# Patient Record
Sex: Female | Born: 1940 | Race: White | Hispanic: No | Smoking: Former smoker
Health system: Southern US, Community
[De-identification: ages and names within clinical notes are randomized; demographics above are authoritative.]

## PROBLEM LIST (undated history)

## (undated) DIAGNOSIS — T7840XA Allergy, unspecified, initial encounter: Secondary | ICD-10-CM

## (undated) DIAGNOSIS — I10 Essential (primary) hypertension: Secondary | ICD-10-CM

## (undated) DIAGNOSIS — E7800 Pure hypercholesterolemia, unspecified: Secondary | ICD-10-CM

## (undated) DIAGNOSIS — Z923 Personal history of irradiation: Secondary | ICD-10-CM

## (undated) DIAGNOSIS — K209 Esophagitis, unspecified without bleeding: Secondary | ICD-10-CM

## (undated) DIAGNOSIS — K219 Gastro-esophageal reflux disease without esophagitis: Secondary | ICD-10-CM

## (undated) DIAGNOSIS — M1990 Unspecified osteoarthritis, unspecified site: Secondary | ICD-10-CM

## (undated) DIAGNOSIS — M7660 Achilles tendinitis, unspecified leg: Secondary | ICD-10-CM

## (undated) DIAGNOSIS — J439 Emphysema, unspecified: Secondary | ICD-10-CM

## (undated) DIAGNOSIS — D170 Benign lipomatous neoplasm of skin and subcutaneous tissue of head, face and neck: Secondary | ICD-10-CM

## (undated) DIAGNOSIS — M5430 Sciatica, unspecified side: Secondary | ICD-10-CM

## (undated) DIAGNOSIS — Z87442 Personal history of urinary calculi: Secondary | ICD-10-CM

## (undated) DIAGNOSIS — E041 Nontoxic single thyroid nodule: Secondary | ICD-10-CM

## (undated) DIAGNOSIS — K2970 Gastritis, unspecified, without bleeding: Secondary | ICD-10-CM

## (undated) HISTORY — DX: Unspecified osteoarthritis, unspecified site: M19.90

## (undated) HISTORY — PX: APPENDECTOMY: SHX54

## (undated) HISTORY — DX: Nontoxic single thyroid nodule: E04.1

## (undated) HISTORY — DX: Emphysema, unspecified: J43.9

## (undated) HISTORY — DX: Gastro-esophageal reflux disease without esophagitis: K21.9

## (undated) HISTORY — DX: Allergy, unspecified, initial encounter: T78.40XA

## (undated) HISTORY — DX: Benign lipomatous neoplasm of skin and subcutaneous tissue of head, face and neck: D17.0

---

## 1983-07-20 HISTORY — PX: ABDOMINAL HYSTERECTOMY: SHX81

## 2002-07-19 HISTORY — PX: BLADDER SUSPENSION: SHX72

## 2004-12-31 ENCOUNTER — Ambulatory Visit: Payer: Self-pay | Admitting: Unknown Physician Specialty

## 2005-11-05 ENCOUNTER — Ambulatory Visit: Payer: Self-pay | Admitting: Unknown Physician Specialty

## 2006-01-04 ENCOUNTER — Ambulatory Visit: Payer: Self-pay | Admitting: Unknown Physician Specialty

## 2006-10-31 ENCOUNTER — Emergency Department: Payer: Self-pay | Admitting: Emergency Medicine

## 2007-01-06 ENCOUNTER — Ambulatory Visit: Payer: Self-pay | Admitting: Unknown Physician Specialty

## 2008-01-09 ENCOUNTER — Ambulatory Visit: Payer: Self-pay | Admitting: Unknown Physician Specialty

## 2009-01-09 ENCOUNTER — Ambulatory Visit: Payer: Self-pay | Admitting: Unknown Physician Specialty

## 2009-01-15 ENCOUNTER — Ambulatory Visit: Payer: Self-pay | Admitting: Unknown Physician Specialty

## 2010-02-06 ENCOUNTER — Ambulatory Visit: Payer: Self-pay | Admitting: Unknown Physician Specialty

## 2010-04-13 ENCOUNTER — Other Ambulatory Visit: Payer: Self-pay | Admitting: Internal Medicine

## 2010-07-20 LAB — HM COLONOSCOPY: HM Colonoscopy: NORMAL

## 2011-04-13 ENCOUNTER — Ambulatory Visit: Payer: Self-pay | Admitting: Internal Medicine

## 2011-05-20 ENCOUNTER — Ambulatory Visit: Payer: Self-pay | Admitting: Unknown Physician Specialty

## 2011-08-02 DIAGNOSIS — I10 Essential (primary) hypertension: Secondary | ICD-10-CM | POA: Diagnosis not present

## 2011-08-02 DIAGNOSIS — R42 Dizziness and giddiness: Secondary | ICD-10-CM | POA: Diagnosis not present

## 2011-08-02 DIAGNOSIS — H6120 Impacted cerumen, unspecified ear: Secondary | ICD-10-CM | POA: Diagnosis not present

## 2011-08-24 DIAGNOSIS — E785 Hyperlipidemia, unspecified: Secondary | ICD-10-CM | POA: Diagnosis not present

## 2011-08-24 DIAGNOSIS — I10 Essential (primary) hypertension: Secondary | ICD-10-CM | POA: Diagnosis not present

## 2011-11-09 DIAGNOSIS — E785 Hyperlipidemia, unspecified: Secondary | ICD-10-CM | POA: Diagnosis not present

## 2011-11-09 DIAGNOSIS — I10 Essential (primary) hypertension: Secondary | ICD-10-CM | POA: Diagnosis not present

## 2011-11-23 DIAGNOSIS — E785 Hyperlipidemia, unspecified: Secondary | ICD-10-CM | POA: Diagnosis not present

## 2011-11-23 DIAGNOSIS — I10 Essential (primary) hypertension: Secondary | ICD-10-CM | POA: Diagnosis not present

## 2011-11-23 DIAGNOSIS — R05 Cough: Secondary | ICD-10-CM | POA: Diagnosis not present

## 2012-03-27 DIAGNOSIS — I10 Essential (primary) hypertension: Secondary | ICD-10-CM | POA: Diagnosis not present

## 2012-03-27 DIAGNOSIS — E785 Hyperlipidemia, unspecified: Secondary | ICD-10-CM | POA: Diagnosis not present

## 2012-04-13 ENCOUNTER — Ambulatory Visit: Payer: Self-pay | Admitting: Internal Medicine

## 2012-04-13 DIAGNOSIS — Z1231 Encounter for screening mammogram for malignant neoplasm of breast: Secondary | ICD-10-CM | POA: Diagnosis not present

## 2012-04-25 DIAGNOSIS — Z23 Encounter for immunization: Secondary | ICD-10-CM | POA: Diagnosis not present

## 2012-08-21 DIAGNOSIS — E785 Hyperlipidemia, unspecified: Secondary | ICD-10-CM | POA: Diagnosis not present

## 2012-08-21 DIAGNOSIS — I10 Essential (primary) hypertension: Secondary | ICD-10-CM | POA: Diagnosis not present

## 2012-08-28 DIAGNOSIS — I129 Hypertensive chronic kidney disease with stage 1 through stage 4 chronic kidney disease, or unspecified chronic kidney disease: Secondary | ICD-10-CM | POA: Diagnosis not present

## 2012-08-28 DIAGNOSIS — E785 Hyperlipidemia, unspecified: Secondary | ICD-10-CM | POA: Diagnosis not present

## 2013-01-22 DIAGNOSIS — M65839 Other synovitis and tenosynovitis, unspecified forearm: Secondary | ICD-10-CM | POA: Diagnosis not present

## 2013-02-19 DIAGNOSIS — I129 Hypertensive chronic kidney disease with stage 1 through stage 4 chronic kidney disease, or unspecified chronic kidney disease: Secondary | ICD-10-CM | POA: Diagnosis not present

## 2013-02-19 DIAGNOSIS — N181 Chronic kidney disease, stage 1: Secondary | ICD-10-CM | POA: Diagnosis not present

## 2013-02-19 DIAGNOSIS — Z0000 Encounter for general adult medical examination without abnormal findings: Secondary | ICD-10-CM | POA: Diagnosis not present

## 2013-02-19 DIAGNOSIS — E785 Hyperlipidemia, unspecified: Secondary | ICD-10-CM | POA: Diagnosis not present

## 2013-04-13 DIAGNOSIS — Z23 Encounter for immunization: Secondary | ICD-10-CM | POA: Diagnosis not present

## 2013-04-17 ENCOUNTER — Ambulatory Visit: Payer: Self-pay | Admitting: Family

## 2013-04-17 DIAGNOSIS — Z1231 Encounter for screening mammogram for malignant neoplasm of breast: Secondary | ICD-10-CM | POA: Diagnosis not present

## 2013-06-21 DIAGNOSIS — I129 Hypertensive chronic kidney disease with stage 1 through stage 4 chronic kidney disease, or unspecified chronic kidney disease: Secondary | ICD-10-CM | POA: Diagnosis not present

## 2013-06-21 DIAGNOSIS — N181 Chronic kidney disease, stage 1: Secondary | ICD-10-CM | POA: Diagnosis not present

## 2013-06-21 DIAGNOSIS — N390 Urinary tract infection, site not specified: Secondary | ICD-10-CM | POA: Diagnosis not present

## 2013-06-21 DIAGNOSIS — T148XXA Other injury of unspecified body region, initial encounter: Secondary | ICD-10-CM | POA: Diagnosis not present

## 2014-01-16 DIAGNOSIS — I10 Essential (primary) hypertension: Secondary | ICD-10-CM | POA: Diagnosis not present

## 2014-01-16 DIAGNOSIS — E785 Hyperlipidemia, unspecified: Secondary | ICD-10-CM | POA: Diagnosis not present

## 2014-01-16 DIAGNOSIS — E8941 Symptomatic postprocedural ovarian failure: Secondary | ICD-10-CM | POA: Diagnosis not present

## 2014-01-16 DIAGNOSIS — K219 Gastro-esophageal reflux disease without esophagitis: Secondary | ICD-10-CM | POA: Diagnosis not present

## 2014-04-18 DIAGNOSIS — Z23 Encounter for immunization: Secondary | ICD-10-CM | POA: Diagnosis not present

## 2014-04-26 ENCOUNTER — Ambulatory Visit: Payer: Self-pay | Admitting: Internal Medicine

## 2014-04-26 DIAGNOSIS — Z1231 Encounter for screening mammogram for malignant neoplasm of breast: Secondary | ICD-10-CM | POA: Diagnosis not present

## 2014-04-26 LAB — HM MAMMOGRAPHY: HM MAMMO: NORMAL

## 2014-06-24 DIAGNOSIS — Z0000 Encounter for general adult medical examination without abnormal findings: Secondary | ICD-10-CM | POA: Diagnosis not present

## 2014-06-24 DIAGNOSIS — E782 Mixed hyperlipidemia: Secondary | ICD-10-CM | POA: Diagnosis not present

## 2014-06-24 DIAGNOSIS — H6123 Impacted cerumen, bilateral: Secondary | ICD-10-CM | POA: Diagnosis not present

## 2014-06-24 DIAGNOSIS — Z720 Tobacco use: Secondary | ICD-10-CM | POA: Diagnosis not present

## 2014-06-24 DIAGNOSIS — Z23 Encounter for immunization: Secondary | ICD-10-CM | POA: Diagnosis not present

## 2014-06-24 DIAGNOSIS — I10 Essential (primary) hypertension: Secondary | ICD-10-CM | POA: Diagnosis not present

## 2014-06-24 DIAGNOSIS — K210 Gastro-esophageal reflux disease with esophagitis: Secondary | ICD-10-CM | POA: Diagnosis not present

## 2014-06-24 LAB — LIPID PANEL
Cholesterol: 181 mg/dL (ref 0–200)
HDL: 59 mg/dL (ref 35–70)
LDL CALC: 92 mg/dL
Triglycerides: 150 mg/dL (ref 40–160)

## 2014-06-24 LAB — CBC AND DIFFERENTIAL: HEMOGLOBIN: 12.7 g/dL (ref 12.0–16.0)

## 2014-06-24 LAB — TSH: TSH: 2.7 u[IU]/mL (ref <=5.90)

## 2014-07-02 DIAGNOSIS — H6120 Impacted cerumen, unspecified ear: Secondary | ICD-10-CM | POA: Diagnosis not present

## 2014-07-02 DIAGNOSIS — H903 Sensorineural hearing loss, bilateral: Secondary | ICD-10-CM | POA: Diagnosis not present

## 2014-07-02 DIAGNOSIS — H9193 Unspecified hearing loss, bilateral: Secondary | ICD-10-CM | POA: Diagnosis not present

## 2014-07-19 DIAGNOSIS — M7660 Achilles tendinitis, unspecified leg: Secondary | ICD-10-CM

## 2014-07-19 HISTORY — DX: Achilles tendinitis, unspecified leg: M76.60

## 2014-09-30 DIAGNOSIS — H43399 Other vitreous opacities, unspecified eye: Secondary | ICD-10-CM | POA: Diagnosis not present

## 2014-11-18 DIAGNOSIS — M7662 Achilles tendinitis, left leg: Secondary | ICD-10-CM | POA: Diagnosis not present

## 2014-11-18 DIAGNOSIS — M7661 Achilles tendinitis, right leg: Secondary | ICD-10-CM | POA: Diagnosis not present

## 2014-12-18 DIAGNOSIS — M7661 Achilles tendinitis, right leg: Secondary | ICD-10-CM | POA: Diagnosis not present

## 2014-12-18 DIAGNOSIS — M7662 Achilles tendinitis, left leg: Secondary | ICD-10-CM | POA: Diagnosis not present

## 2014-12-23 ENCOUNTER — Other Ambulatory Visit: Payer: Self-pay | Admitting: Internal Medicine

## 2014-12-23 ENCOUNTER — Encounter: Payer: Self-pay | Admitting: Internal Medicine

## 2014-12-23 ENCOUNTER — Encounter: Payer: Self-pay | Admitting: Emergency Medicine

## 2014-12-23 ENCOUNTER — Ambulatory Visit
Admission: EM | Admit: 2014-12-23 | Discharge: 2014-12-23 | Disposition: A | Discharge status: Home / Self Care | Payer: Medicare Other | Attending: Internal Medicine | Admitting: Internal Medicine

## 2014-12-23 DIAGNOSIS — M5442 Lumbago with sciatica, left side: Secondary | ICD-10-CM | POA: Diagnosis not present

## 2014-12-23 HISTORY — DX: Sciatica, unspecified side: M54.30

## 2014-12-23 HISTORY — DX: Pure hypercholesterolemia, unspecified: E78.00

## 2014-12-23 HISTORY — DX: Esophagitis, unspecified: K20.9

## 2014-12-23 HISTORY — DX: Achilles tendinitis, unspecified leg: M76.60

## 2014-12-23 HISTORY — DX: Esophagitis, unspecified without bleeding: K20.90

## 2014-12-23 HISTORY — DX: Essential (primary) hypertension: I10

## 2014-12-23 HISTORY — DX: Gastritis, unspecified, without bleeding: K29.70

## 2014-12-23 MED ORDER — NAPROXEN SODIUM 220 MG PO TABS: 220.0000 mg | ORAL_TABLET | Freq: Two times a day (BID) | ORAL | Status: DC

## 2014-12-23 NOTE — ED Provider Notes (Signed)
CSN: [id number]     Arrival date & time 12/23/14  2774 History   First MD Initiated Contact with Patient 12/23/14 1051     Chief Complaint  Patient presents with  . Back Pain   HPI  Patient with intermittent mild low back discomfort, sometimes radiates down the left leg. She reports shopping with her daughter 2 days ago, and had the onset of twinges of discomfort in her left low back. Yesterday, she had a lot of pain in her left low back, radiating to the knee on the left, and spent most of the day on the couch. She reports that it was painful to change positions, and she really had a lot of discomfort with rising from a seated position. No leg weakness or clumsiness, no change in bowel or bladder function. She used ice on her back, and took Aleve straight afternoon, and feels quite a bit better today. She was able to walk into the urgent care independently.  Past Medical History  Diagnosis Date  . Gastritis   . Esophagitis   . Hypercholesteremia   . Hypertension   . Sciatica   . Tendonitis, Achilles 2016    both    Past Surgical History  Procedure Laterality Date  . Abdominal hysterectomy    . Appendectomy    . Bladder suspension     No family history on file. History  Substance Use Topics  . Smoking status: Former Research scientist (life sciences)  . Smokeless tobacco: Not on file  . Alcohol Use: No   OB History    No data available     Review of Systems  All other systems reviewed and are negative.   Allergies  Etodolac  Home Medications   Prior to Admission medications   Medication Sig Start Date End Date Taking? Authorizing Provider  calcium citrate-vitamin D (CITRACAL+D) 315-200 MG-UNIT per tablet Take 1 tablet by mouth daily.   Yes Historical Provider, MD  cetirizine (ZYRTEC) 10 MG tablet Take 10 mg by mouth daily.   Yes Historical Provider, MD  etodolac (LODINE) 500 MG tablet Take 500 mg by mouth 2 (two) times daily.   Yes Historical Provider, MD  losartan-hydrochlorothiazide (HYZAAR)  50-12.5 MG per tablet Take 1 tablet by mouth daily.   Yes Historical Provider, MD  meloxicam (MOBIC) 15 MG tablet Take 15 mg by mouth daily.   Yes Historical Provider, MD  methylPREDNISolone (MEDROL DOSEPAK) 4 MG TBPK tablet Take 4 mg by mouth taper from 4 doses each day to 1 dose and stop.   Yes Historical Provider, MD  methylPREDNISolone acetate (DEPO-MEDROL) 40 MG/ML injection (RADIOLOGY ONLY) 40 mg once.   Yes Historical Provider, MD  Omega-3 Fatty Acids (FISH OIL) 1200 MG CPDR Take 1 capsule by mouth 2 (two) times daily.   Yes Historical Provider, MD  omeprazole (PRILOSEC) 20 MG capsule Take 20 mg by mouth daily.   Yes Historical Provider, MD  simvastatin (ZOCOR) 40 MG tablet Take 40 mg by mouth daily.   Yes Historical Provider, MD   BP 149/84 mmHg  Pulse 72  Temp(Src) 98.2 F (36.8 C) (Oral)  Resp 17  Ht 5\' 4"  (1.626 m)  Wt 175 lb (79.379 kg)  BMI 30.02 kg/m2  SpO2 98% Physical Exam  Constitutional: She is oriented to person, place, and time. No distress.  Alert, nicely groomed  HENT:  Head: Atraumatic.  Eyes:  Conjugate gaze, no eye redness/drainage  Neck: Neck supple.  Cardiovascular: Normal rate.   Pulmonary/Chest: No respiratory distress.  Abdominal: She exhibits no distension.  Musculoskeletal: Normal range of motion.  No leg swelling No pain with palpation of the lower back. Maybe some mild muscle spasm in the left paralumbar area. No rash. Strength in the bilateral lower extremities, proximal and distal, is 5 out of 5 and symmetric. Resisted left hip flexion is painful in the left low back.  Neurological: She is alert and oriented to person, place, and time.  Patient was able to walk into the urgent care today independently.  Skin: Skin is warm and dry.  No cyanosis  Nursing note and vitals reviewed.   ED Course  Procedures (including critical care time) Labs Review Labs Reviewed - No data to display  Imaging Review No results found.   MDM   1.  Left-sided low back pain with left-sided sciatica    The patient is using Aleve over-the-counter with success, and would like to continue this. She can take up to 2 tabs twice daily for 7-10 days as needed. At this time, we will hold off on steroids, muscle relaxers. She might benefit from a course of physical therapy, or chiropractic. She will follow-up with her PCP/Dr. Army Melia, or with the urgent care, if not improving in 7-10 days.    Sherlene Shams, MD 12/23/14 [phone]

## 2014-12-23 NOTE — ED Notes (Signed)
Pt reports back pain , lower back going down left leg to knee. Pain started Sat night. Previous meds for an achiles tendon pain in both legs but stopped those meds.

## 2014-12-23 NOTE — Discharge Instructions (Signed)
Can try ice and otc pain patches for relief. Chiropractic care or physical therapy may be beneficial.  Aleve up to ii tabs twice daily as needed, for 7-10 days.  Back Exercises Back exercises help treat and prevent back injuries. The goal is to increase your strength in your belly (abdominal) and back muscles. These exercises can also help with flexibility. Start these exercises when told by your doctor. HOME CARE Back exercises include: Pelvic Tilt.  Lie on your back with your knees bent. Tilt your pelvis until the lower part of your back is against the floor. Hold this position 5 to 10 sec. Repeat this exercise 5 to 10 times. Knee to Chest.  Pull 1 knee up against your chest and hold for 20 to 30 seconds. Repeat this with the other knee. This may be done with the other leg straight or bent, whichever feels better. Then, pull both knees up against your chest. Sit-Ups or Curl-Ups.  Bend your knees 90 degrees. Start with tilting your pelvis, and do a partial, slow sit-up. Only lift your upper half 30 to 45 degrees off the floor. Take at least 2 to 3 seonds for each sit-up. Do not do sit-ups with your knees out straight. If partial sit-ups are difficult, simply do the above but with only tightening your belly (abdominal) muscles and holding it as told. Hip-Lift.  Lie on your back with your knees flexed 90 degrees. Push down with your feet and shoulders as you raise your hips 2 inches off the floor. Hold for 10 seconds, repeat 5 to 10 times. Back Arches.  Lie on your stomach. Prop yourself up on bent elbows. Slowly press on your hands, causing an arch in your low back. Repeat 3 to 5 times. Shoulder-Lifts.  Lie face down with arms beside your body. Keep hips and belly pressed to floor as you slowly lift your head and shoulders off the floor. Do not overdo your exercises. Be careful in the beginning. Exercises may cause you some mild back discomfort. If the pain lasts for more than 15 minutes,  stop the exercises until you see your doctor. Improvement with exercise for back problems is slow.  Document Released: 08/07/2010 Document Revised: 09/27/2011 Document Reviewed: 05/06/2011 [facility] Patient Information 2015 [place], [place]. This information is not intended to replace advice given to you by your health care provider. Make sure you discuss any questions you have with your health care provider.

## 2015-01-21 DIAGNOSIS — H903 Sensorineural hearing loss, bilateral: Secondary | ICD-10-CM | POA: Diagnosis not present

## 2015-01-21 DIAGNOSIS — H6120 Impacted cerumen, unspecified ear: Secondary | ICD-10-CM | POA: Diagnosis not present

## 2015-01-22 ENCOUNTER — Encounter: Payer: Self-pay | Admitting: Internal Medicine

## 2015-01-22 ENCOUNTER — Ambulatory Visit (INDEPENDENT_AMBULATORY_CARE_PROVIDER_SITE_OTHER): Payer: Medicare Other | Admitting: Internal Medicine

## 2015-01-22 VITALS — BP 136/78 | HR 80 | Ht 64.5 in | Wt 172.4 lb

## 2015-01-22 DIAGNOSIS — N3000 Acute cystitis without hematuria: Secondary | ICD-10-CM | POA: Diagnosis not present

## 2015-01-22 DIAGNOSIS — S39012A Strain of muscle, fascia and tendon of lower back, initial encounter: Secondary | ICD-10-CM | POA: Diagnosis not present

## 2015-01-22 LAB — POC URINALYSIS WITH MICROSCOPIC (NON AUTO)MANUAL RESULT
BILIRUBIN UA: NEGATIVE
Blood, UA: NEGATIVE
Crystals: 0
EPITHELIAL CELLS, URINE PER MICROSCOPY: 0
Glucose, UA: NEGATIVE
KETONES UA: NEGATIVE
Leukocytes, UA: NEGATIVE
Mucus, UA: 0
NITRITE UA: NEGATIVE
PH UA: 5
Protein, UA: NEGATIVE
RBC: 2 M/uL — AB (ref 4.04–5.48)
UROBILINOGEN UA: 0.2
WBC CASTS UA: 0

## 2015-01-22 MED ORDER — CIPROFLOXACIN HCL 250 MG PO TABS: 250.0000 mg | ORAL_TABLET | Freq: Two times a day (BID) | ORAL | Status: DC

## 2015-01-22 MED ORDER — BACLOFEN 10 MG PO TABS: 10.0000 mg | ORAL_TABLET | Freq: Three times a day (TID) | ORAL | Status: DC

## 2015-01-22 NOTE — Progress Notes (Signed)
Date:  01/22/2015   Name:  Monique Hall   DOB:  1941/01/22   MRN:  [id number]   Chief Complaint: Urinary Tract Infection Urinary Tract Infection  This is a new problem. The current episode started in the past 7 days. The problem has been unchanged. The quality of the pain is described as burning. The pain is mild. There has been no fever. Associated symptoms include frequency, hematuria, hesitancy and urgency.  Back Pain This is a new problem. The current episode started 1 to 4 weeks ago. The pain is present in the sacro-iliac. The quality of the pain is described as aching. The pain radiates to the left thigh and right thigh. The symptoms are aggravated by standing and twisting. Pertinent negatives include no chest pain, fever, headaches, numbness or weakness. She has tried analgesics and heat for the symptoms.  She has been taking meloxicam daily along with tylenol and otc pain patches from when she was seen in UC.  She did not get any xrays.  She has made an appointment with Dr. Primus Bravo for later this month.   Review of Systems:  Review of Systems  Constitutional: Negative for fever.  Respiratory: Negative for shortness of breath.   Cardiovascular: Negative for chest pain and leg swelling.  Genitourinary: Positive for hesitancy, urgency, frequency and hematuria.  Musculoskeletal: Positive for back pain. Negative for joint swelling and gait problem.  Neurological: Negative for weakness, numbness and headaches.    There are no active problems to display for this patient.   Prior to Admission medications   Medication Sig Start Date End Date Taking? Authorizing Provider  Black Cohosh 20 MG TABS Take 1 tablet by mouth daily.   Yes Historical Provider, MD  calcium citrate-vitamin D (CITRACAL+D) 315-200 MG-UNIT per tablet Take 1 tablet by mouth daily.   Yes Historical Provider, MD  cetirizine (ZYRTEC) 10 MG tablet Take 10 mg by mouth daily.   Yes Historical Provider, MD    losartan-hydrochlorothiazide (HYZAAR) 50-12.5 MG per tablet Take 1 tablet by mouth daily.   Yes Historical Provider, MD  meloxicam (MOBIC) 15 MG tablet Take 1 tablet by mouth daily as needed. 11/21/14  Yes Historical Provider, MD  Omega-3 Fatty Acids (FISH OIL) 1200 MG CPDR Take 1 capsule by mouth 2 (two) times daily.   Yes Historical Provider, MD  omeprazole (PRILOSEC) 20 MG capsule Take 20 mg by mouth daily.   Yes Historical Provider, MD  simvastatin (ZOCOR) 40 MG tablet Take 40 mg by mouth daily.   Yes Historical Provider, MD    Allergies  Allergen Reactions  . Etodolac Other (See Comments)    Pt got indigestion and nightmares;     Past Surgical History  Procedure Laterality Date  . Abdominal hysterectomy  1985    total for bleeding  . Appendectomy    . Bladder suspension  2004    History  Substance Use Topics  . Smoking status: Former Research scientist (life sciences)  . Smokeless tobacco: Not on file  . Alcohol Use: No     Medication list has been reviewed and updated.  Physical Examination:  Physical Exam  Constitutional: She appears well-developed and well-nourished. No distress.  Neck: Normal range of motion. Neck supple.  Cardiovascular: Normal rate, regular rhythm and normal heart sounds.   Pulmonary/Chest: Breath sounds normal. She has no wheezes.  Abdominal: Soft. Bowel sounds are normal. She exhibits no mass. There is no tenderness. There is no rebound.  Musculoskeletal: She exhibits no edema.  Lumbar back: She exhibits tenderness.       Back:  SLR is negative bilaterally   Neurological: She has normal strength and normal reflexes. No sensory deficit.    BP 136/78 mmHg  Pulse 80  Ht 5' 4.5" (1.638 m)  Wt 172 lb 6.4 oz (78.2 kg)  BMI 29.15 kg/m2  Assessment and Plan: 1. Acute cystitis without hematuria Increase fluid intake - POC urinalysis w microscopic (non auto) - ciprofloxacin (CIPRO) 250 MG tablet; Take 1 tablet (250 mg total) by mouth 2 (two) times daily.   Dispense: 6 tablet; Refill: 0  2. Sacroiliac strain, initial encounter Continue meloxicam Use heat or ice Keep appointment with Dr. Primus Bravo - baclofen (LIORESAL) 10 MG tablet; Take 1 tablet (10 mg total) by mouth 3 (three) times daily.  Dispense: 90 each; Refill: 0   Halina Maidens, MD [facility]  01/22/2015

## 2015-01-28 ENCOUNTER — Telehealth: Payer: Self-pay

## 2015-01-28 DIAGNOSIS — S39012S Strain of muscle, fascia and tendon of lower back, sequela: Secondary | ICD-10-CM

## 2015-01-28 DIAGNOSIS — S39012A Strain of muscle, fascia and tendon of lower back, initial encounter: Secondary | ICD-10-CM | POA: Insufficient documentation

## 2015-01-28 MED ORDER — METHOCARBAMOL 500 MG PO TABS: 500.0000 mg | ORAL_TABLET | Freq: Three times a day (TID) | ORAL | Status: DC

## 2015-01-28 NOTE — Telephone Encounter (Signed)
Patient states she gets too sleepy on Baclofen, taking only two a day and cannot handle, too tired to do anything but sleep.dr

## 2015-01-28 NOTE — Telephone Encounter (Signed)
Patient informed.dr 

## 2015-01-28 NOTE — Telephone Encounter (Signed)
Baclofen discontinued.   Robaxin sent to pharmacy.

## 2015-02-04 ENCOUNTER — Other Ambulatory Visit: Payer: Self-pay | Admitting: Physical Medicine and Rehabilitation

## 2015-02-04 DIAGNOSIS — M5416 Radiculopathy, lumbar region: Secondary | ICD-10-CM

## 2015-02-04 DIAGNOSIS — M4806 Spinal stenosis, lumbar region: Secondary | ICD-10-CM | POA: Diagnosis not present

## 2015-02-05 DIAGNOSIS — M5116 Intervertebral disc disorders with radiculopathy, lumbar region: Secondary | ICD-10-CM | POA: Insufficient documentation

## 2015-02-11 ENCOUNTER — Ambulatory Visit
Admission: RE | Admit: 2015-02-11 | Discharge: 2015-02-11 | Disposition: A | Discharge status: Home / Self Care | Payer: Medicare Other | Source: Ambulatory Visit | Attending: Physical Medicine and Rehabilitation | Admitting: Physical Medicine and Rehabilitation

## 2015-02-11 DIAGNOSIS — M4806 Spinal stenosis, lumbar region: Secondary | ICD-10-CM | POA: Insufficient documentation

## 2015-02-11 DIAGNOSIS — M1288 Other specific arthropathies, not elsewhere classified, other specified site: Secondary | ICD-10-CM | POA: Insufficient documentation

## 2015-02-11 DIAGNOSIS — M47816 Spondylosis without myelopathy or radiculopathy, lumbar region: Secondary | ICD-10-CM | POA: Diagnosis not present

## 2015-02-11 DIAGNOSIS — M5416 Radiculopathy, lumbar region: Secondary | ICD-10-CM

## 2015-02-11 DIAGNOSIS — M5126 Other intervertebral disc displacement, lumbar region: Secondary | ICD-10-CM | POA: Diagnosis not present

## 2015-03-11 DIAGNOSIS — M5136 Other intervertebral disc degeneration, lumbar region: Secondary | ICD-10-CM | POA: Diagnosis not present

## 2015-03-11 DIAGNOSIS — M4806 Spinal stenosis, lumbar region: Secondary | ICD-10-CM | POA: Diagnosis not present

## 2015-03-11 DIAGNOSIS — M5416 Radiculopathy, lumbar region: Secondary | ICD-10-CM | POA: Diagnosis not present

## 2015-04-01 DIAGNOSIS — M5416 Radiculopathy, lumbar region: Secondary | ICD-10-CM | POA: Diagnosis not present

## 2015-04-01 DIAGNOSIS — M4806 Spinal stenosis, lumbar region: Secondary | ICD-10-CM | POA: Diagnosis not present

## 2015-04-01 DIAGNOSIS — M5136 Other intervertebral disc degeneration, lumbar region: Secondary | ICD-10-CM | POA: Diagnosis not present

## 2015-04-22 DIAGNOSIS — Z23 Encounter for immunization: Secondary | ICD-10-CM | POA: Diagnosis not present

## 2015-05-06 DIAGNOSIS — M5416 Radiculopathy, lumbar region: Secondary | ICD-10-CM | POA: Diagnosis not present

## 2015-05-06 DIAGNOSIS — M5136 Other intervertebral disc degeneration, lumbar region: Secondary | ICD-10-CM | POA: Diagnosis not present

## 2015-05-06 DIAGNOSIS — M4806 Spinal stenosis, lumbar region: Secondary | ICD-10-CM | POA: Diagnosis not present

## 2015-06-10 ENCOUNTER — Other Ambulatory Visit: Payer: Self-pay | Admitting: Internal Medicine

## 2015-06-17 DIAGNOSIS — M5136 Other intervertebral disc degeneration, lumbar region: Secondary | ICD-10-CM | POA: Diagnosis not present

## 2015-06-17 DIAGNOSIS — M5416 Radiculopathy, lumbar region: Secondary | ICD-10-CM | POA: Diagnosis not present

## 2015-06-17 DIAGNOSIS — M4806 Spinal stenosis, lumbar region: Secondary | ICD-10-CM | POA: Diagnosis not present

## 2015-06-18 DIAGNOSIS — M4806 Spinal stenosis, lumbar region: Secondary | ICD-10-CM | POA: Diagnosis not present

## 2015-06-18 DIAGNOSIS — M5136 Other intervertebral disc degeneration, lumbar region: Secondary | ICD-10-CM | POA: Diagnosis not present

## 2015-06-18 DIAGNOSIS — M5416 Radiculopathy, lumbar region: Secondary | ICD-10-CM | POA: Diagnosis not present

## 2015-07-15 DIAGNOSIS — N952 Postmenopausal atrophic vaginitis: Secondary | ICD-10-CM | POA: Diagnosis not present

## 2015-07-15 DIAGNOSIS — M545 Low back pain: Secondary | ICD-10-CM | POA: Diagnosis not present

## 2015-07-15 DIAGNOSIS — R102 Pelvic and perineal pain: Secondary | ICD-10-CM | POA: Diagnosis not present

## 2015-07-15 DIAGNOSIS — N393 Stress incontinence (female) (male): Secondary | ICD-10-CM | POA: Diagnosis not present

## 2015-07-24 ENCOUNTER — Encounter: Payer: Self-pay | Admitting: Physical Therapy

## 2015-07-24 ENCOUNTER — Ambulatory Visit: Payer: Medicare Other | Attending: Obstetrics and Gynecology | Admitting: Physical Therapy

## 2015-07-24 VITALS — BP 130/70

## 2015-07-24 DIAGNOSIS — Z7409 Other reduced mobility: Secondary | ICD-10-CM | POA: Insufficient documentation

## 2015-07-24 DIAGNOSIS — M609 Myositis, unspecified: Secondary | ICD-10-CM | POA: Insufficient documentation

## 2015-07-24 DIAGNOSIS — M791 Myalgia: Secondary | ICD-10-CM | POA: Diagnosis not present

## 2015-07-24 DIAGNOSIS — IMO0001 Reserved for inherently not codable concepts without codable children: Secondary | ICD-10-CM

## 2015-07-25 NOTE — Therapy (Addendum)
[place] MAIN [facility] SERVICES [address] [place], [place], [address] Phone: [phone]   Fax:  [phone]  Physical Therapy Evaluation  Patient Details  Name: Monique Hall MRN: [id number] Date of Birth: [birth year] Referring Provider: Leafy Ro  Encounter Date: 07/24/2015      PT End of Session - 07/27/15 0000    Visit Number 1   Number of Visits 12   Date for PT Re-Evaluation 10/16/15   Authorization Type G-code at 10th   PT Start Time 0900   PT Stop Time 1000   PT Time Calculation (min) 60 min   Activity Tolerance Patient tolerated treatment well;No increased pain   Behavior During Therapy [facility] for tasks assessed/performed      Past Medical History  Diagnosis Date  . Gastritis   . Esophagitis   . Hypercholesteremia   . Hypertension   . Sciatica   . Tendonitis, Achilles 2016    both     Past Surgical History  Procedure Laterality Date  . Abdominal hysterectomy  1985    total for bleeding  . Appendectomy    . Bladder suspension  2004    Filed Vitals:   07/24/15 0927  BP: 130/70    Visit Diagnosis:  Myalgia and myositis - Plan: PT plan of care cert/re-cert  Decreased mobility and endurance - Plan: PT plan of care cert/re-cert          [facility] PT Assessment - 07/27/15 0007    Assessment   Medical Diagnosis back pain    Precautions   Precautions None   Restrictions   Weight Bearing Restrictions No   Balance Screen   Has the patient fallen in the past 6 months No   Observation/Other Assessments   Observations slumped sitting   forward trunk lean in gait    Other Surveys  --  OD 22%, PDI 22%    Coordination   Gross Motor Movements are Fluid and Coordinated --  limited diaphragmatic excursion   Single Leg Stance   Comments R 3 sec, L 5 sec    Posture/Postural Control   Posture Comments lumbopelvic instability with hip flexion in supine   AROM   Overall AROM Comments 110 deg in forward flexion, ~30 deg in  ext with p!, sidebend/ rotation bilaterally w/ p!, R hip flexion with p! in supine,    Post-Tx: no pain with trunk rotation bilaterally, R hip flex   Strength   Overall Strength Comments Hip ER/IR and knee flexion on R 4-/5 , L 4/5   hip abd R 3/5, L 4/5   Palpation   Spinal mobility decreased thoracic mobility with increased paraspinal mm tensions bilaterally   SI assessment  R sacral torsion    Palpation comment abdominal restrictions   Bed Mobility   Bed Mobility --  half crunch method, able to log roll with cues   Ambulation/Gait   Ambulation Distance (Feet) 10 Feet   Gait velocity 0.83 m/s                   [facility] Adult PT Treatment/Exercise - 07/26/15 2349    Bed Mobility   Bed Mobility --  half crunch method, able to log roll with cues   Manual Therapy   Joint Mobilization Thoracic mobs PA on SP Grade III , decreased hypomobility post-Tx (sidelying)   PA mobs on R PSIS  Grade II    Soft tissue mobilization midback mm releases bilaterally, MWM open book  PT Education - 07/26/15 2359    Education provided Yes   Education Details POC, HEP, goals, anatomy and physiology    Person(s) Educated Patient   Methods Explanation;Demonstration;Verbal cues;Tactile cues;Handout   Comprehension Verbalized understanding;Returned demonstration             PT Long Term Goals - 07/24/15 0944    PT LONG TERM GOAL #1   Title Pt will be able to wash dishes and make bed without breaks across 3 days in order to return to ADLs.    Time 12   Period Weeks   Status New   PT LONG TERM GOAL #2   Title Pt will decrease her [place] Tract score from 22% to < 12%      in order to improve QOL.    Time 12   Period Weeks   Status New   PT LONG TERM GOAL #3   Title Pt will decrease her ODI score from 22% to < 12%        in order to return to shopping, lifting.    Time 12   Period Weeks   Status New   PT LONG TERM GOAL #4   Title Pt will show no lumbopelvic  instability with deep core exercise level 1-4 in order to progress to loaded activities like holding great grandson.    Time 12   Period Weeks   Status Deferred   PT LONG TERM GOAL #5   Title Pt will demo proper lifting mechanics with lifting 15# and carrying it across 10 ft in order to lift  great grandson from a pack and play and walk him across the room.    Time 12   Period Weeks   Status New   Additional Long Term Goals   Additional Long Term Goals Yes   PT LONG TERM GOAL #6   Title Pt will demo increased gait speed from .83 m/s to >1.0 m/s in order progress towards community ambulator speed to cross sidewalks safely.   Time 12   Period Weeks   Status New               Plan - 07/27/15 0002    Clinical Impression Statement Pt is  a 75 yo female who c/o LBP accompanied by radiating pain L posterior thigh that impacts her ability to sit-to stand, walk, and lifting great grandson. Pt's S & Sx consist of Hx of abdominal and perineal scars, achilles tendinitis,  Hx of vaginal mesh surgery in addition to decreased pelvic girdle stability,  increased mm tensions at midback with limited and painful trunk rotation, sacral malalignment, limited hip and spinal mobility,  poor coordination for deep core mm, and decreased gait speed.  Pt's condition is evolving.  Pt's  complexity is moderate.     Pt will benefit from skilled therapeutic intervention in order to improve on the following deficits Abnormal gait;Decreased strength;Increased muscle spasms;Improper body mechanics;Postural dysfunction;Decreased mobility;Impaired flexibility;Decreased activity tolerance;Decreased balance;Hypomobility;Decreased safety awareness;Pain;Impaired UE functional use;Decreased coordination;Decreased endurance;Decreased scar mobility;Increased fascial restricitons   Rehab Potential Good   PT Frequency 1x / week   PT Duration 12 weeks   PT Treatment/Interventions ADLs/Self Care Home Management;Neuromuscular  re-education;Balance training;Cryotherapy;Biofeedback;Aquatic Therapy;Gait training;Stair training;Functional mobility training;Therapeutic activities;Therapeutic exercise;Manual techniques;Scar mobilization;Moist Heat;Patient/family education;Traction;Taping;Energy conservation;Dry needling;Passive range of motion   PT Next Visit Plan assess feet as it impacts walking (considering Hx of achilles tendinitis)   Consulted and Agree with Plan of Care Patient  G-Codes - 07/27/15 0020    Functional Assessment Tool Used ODI, PDI    Functional Limitation Mobility: Walking and moving around   Mobility: Walking and Moving Around Current Status [phone]) At least 20 percent but less than 40 percent impaired, limited or restricted   Mobility: Walking and Moving Around Goal Status [phone]) At least 1 percent but less than 20 percent impaired, limited or restricted       Problem List Patient Active Problem List   Diagnosis Date Noted  . Sacroiliac strain 01/28/2015    Jerl Mina ,PT, DPT, E-RYT  07/27/2015, 12:24 AM  [place] [facility] MAIN [facility] SERVICES [address] [place], [place], [address] Phone: [phone]   Fax:  [phone]  Name: Monique Hall MRN: [id number] Date of Birth: 18-May-1941

## 2015-07-26 NOTE — Patient Instructions (Signed)
Open book (Hand out)

## 2015-07-27 NOTE — Addendum Note (Signed)
Addended by: Jerl Mina on: 07/27/2015 12:25 AM   Modules accepted: Orders

## 2015-07-28 ENCOUNTER — Encounter: Payer: Medicare Other | Admitting: Physical Therapy

## 2015-07-29 ENCOUNTER — Ambulatory Visit: Payer: Medicare Other | Admitting: Physical Therapy

## 2015-07-29 ENCOUNTER — Other Ambulatory Visit: Payer: Self-pay | Admitting: Internal Medicine

## 2015-07-29 DIAGNOSIS — Z7409 Other reduced mobility: Secondary | ICD-10-CM

## 2015-07-29 DIAGNOSIS — E782 Mixed hyperlipidemia: Secondary | ICD-10-CM | POA: Insufficient documentation

## 2015-07-29 DIAGNOSIS — M791 Myalgia: Secondary | ICD-10-CM | POA: Diagnosis not present

## 2015-07-29 DIAGNOSIS — I10 Essential (primary) hypertension: Secondary | ICD-10-CM | POA: Insufficient documentation

## 2015-07-29 DIAGNOSIS — N951 Menopausal and female climacteric states: Secondary | ICD-10-CM | POA: Insufficient documentation

## 2015-07-29 DIAGNOSIS — M609 Myositis, unspecified: Secondary | ICD-10-CM | POA: Diagnosis not present

## 2015-07-29 DIAGNOSIS — IMO0001 Reserved for inherently not codable concepts without codable children: Secondary | ICD-10-CM

## 2015-07-29 DIAGNOSIS — F17201 Nicotine dependence, unspecified, in remission: Secondary | ICD-10-CM | POA: Insufficient documentation

## 2015-07-29 NOTE — Patient Instructions (Signed)
PELVIC FLOOR / KEGEL EXERCISES   Pelvic floor/ Kegel exercises are used to strengthen the muscles in the base of your pelvis that are responsible for supporting your pelvic organs and preventing urine/feces leakage. Based on your therapist's recommendations, they can be performed while standing, sitting, or lying down.  Make yourself aware of this muscle group by using these cues:  Imagine you are in a crowded room and you feel the need to pass gas. Your response is to pull up and in at the rectum.  Close the rectum. Pull the muscles up inside your body,feeling your scrotum lifting as well . Feel the pelvic floor muscles lift as if you were walking into a cold lake.  Place your hand on top of your pubic bone. Tighten and draw in the muscles around the anal muscles without squeezing the buttock muscles.  Common Errors:  Breath holding: If you are holding your breath, you may be bearing down against your bladder instead of pulling it up. If you belly bulges up while you are squeezing, you are holding your breath. Be sure to breathe gently in and out while exercising. Counting out loud may help you avoid holding your breath.  Accessory muscle use: You should not see or feel other muscle movement when performing pelvic floor exercises. When done properly, no one can tell that you are performing the exercises. Keep the buttocks, belly and inner thighs relaxed.  Overdoing it: Your muscles can fatigue and stop working for you if you over-exercise. You may actually leak more or feel soreness at the lower abdomen or rectum.  YOUR HOME EXERCISE PROGRAM  LONG HOLDS: Position: on back  Inhale and then exhale. Then squeeze the muscle and count aloud for 10 seconds. Rest with three long breaths. (Be sure to let belly sink in with exhales and not push outward)  Perform 3 repetitions, 3 times/day    **ALSO SQUEEZE BEFORE YOUR SNEEZE, COUGH, LAUGH to decrease downward pressure   **ALSO EXHALE BEFORE  YOU RISE AGAINST GRAVITY (lifting, sit to stand, from squat to stand)    ________________________________   Scar Tissue Massage (handout)      Cautions:  While treatment is most effective when a scar is still in its immature phase, it is also better to have a doctor's permission. A few additional cautions for immature scars include:      Take extreme care with radiated tissues, as the skin is delicate and can break easily.      Aside from friction massage, do not continue if your actions cause pain or increase tissue redness.      Never perform massage on any open lesions.

## 2015-07-30 NOTE — Therapy (Signed)
[place] MAIN [facility] SERVICES [address] [place], [place], [address] Phone: [phone]   Fax:  [phone]  Physical Therapy Treatment  Patient Details  Name: Monique Hall MRN: [id number] Date of Birth: [birth year] Referring Provider: Leafy Ro  Encounter Date: 07/29/2015      PT End of Session - 07/30/15 2344    Visit Number 2   Number of Visits 12   Date for PT Re-Evaluation 10/16/15   Authorization Type G-code at 10th   PT Start Time 1530   PT Stop Time 1630   PT Time Calculation (min) 60 min   Activity Tolerance Patient tolerated treatment well;No increased pain   Behavior During Therapy [facility] for tasks assessed/performed      Past Medical History  Diagnosis Date  . Gastritis   . Esophagitis   . Hypercholesteremia   . Hypertension   . Sciatica   . Tendonitis, Achilles 2016    both     Past Surgical History  Procedure Laterality Date  . Abdominal hysterectomy  1985    total for bleeding  . Appendectomy    . Bladder suspension  2004    There were no vitals filed for this visit.  Visit Diagnosis:  Myalgia and myositis  Decreased mobility and endurance      Subjective Assessment - 07/30/15 2234    Subjective Pt reported her pain has improved by 60% since last visit but radiating pain still presists but with less severity.    Pertinent History Hx of vaginal mesh for incontinence with success, 3 pregnancies (vaginal with tears and stitches), hysterectomy 1980, appendectomy 1969, Achilles tendonitis (improved).     How long can you stand comfortably? 15-20 min   How long can you walk comfortably? after walking in [employer] 30 min    Patient Stated Goals 1) wash dishes and make bed without breaks, 2) spend time with great granchildren, 3) walking in the yard             [facility] PT Assessment - 07/30/15 2301    AROM   Overall AROM Comments hip flexion without pain, trunk rotation with out pain    Palpation   Spinal  mobility decreased paraspinal mm tensions bilaterally   SI assessment  SIJ symmetry noted    Ambulation/Gait   Ambulation Distance (Feet) 10 Feet   Gait velocity 0.90 m/s                   Pelvic Floor Special Questions - 07/30/15 2316    Pelvic Floor Internal Exam pt consented verbally and had no contraindications   Exam Type Vaginal   Palpation increased mm tensions/ tenderness on R coccygeus , decreased post-Tx  increased scar restriction, iliocyccygeus near coccyx bil   Strength weak squeeze, no lift  posterior > ant   Strength # of reps 3   Strength # of seconds 10           [facility] Adult PT Treatment/Exercise - 07/30/15 2301    Self-Care   Self-Care --  guided pt to perform self scar massage    Neuro Re-ed    Neuro Re-ed Details  pelvic floor coordination with verbal, visual, and tactile cues    Manual Therapy   Myofascial Release lower abdominal scar  massage    Internal Pelvic Floor sustained pressure on R coccygeus   thiele massage over perineal scar  PT Education - 07/30/15 2344    Education provided Yes   Education Details HEP   Person(s) Educated Patient   Methods Explanation;Demonstration;Tactile cues;Verbal cues;Handout   Comprehension Verbalized understanding;Returned demonstration             PT Long Term Goals - 07/30/15 2353    PT LONG TERM GOAL #1   Title Pt will be able to wash dishes and make bed without breaks across 3 days in order to return to ADLs.    Time 12   Period Weeks   Status On-going   PT LONG TERM GOAL #2   Title Pt will decrease her [place] score from 22% to < 12%      in order to improve QOL.    Time 12   Period Weeks   Status On-going   PT LONG TERM GOAL #3   Title Pt will decrease her ODI score from 22% to < 12%        in order to return to shopping, lifting.    Time 12   Period Weeks   Status On-going   PT LONG TERM GOAL #4   Title Pt will show no lumbopelvic instability with deep core  exercise level 1-4 in order to progress to loaded activities like holding great grandson.    Time 12   Period Weeks   Status On-going   PT LONG TERM GOAL #5   Title Pt will demo proper lifting mechanics with lifting 15# and carrying it across 10 ft in order to lift  great grandson from a pack and play and walk him across the room.    Time 12   Period Weeks   Status On-going   PT LONG TERM GOAL #6   Title Pt will demo increased gait speed from .83 m/s to >1.0 m/s in order progress towards community ambulator speed to cross sidewalks safely.   Time 12   Period Weeks   Status Partially Met               Plan - 07/30/15 2345    Clinical Impression Statement Pt shows improved SIJ symmetry and decreased midback mm tensions with imprve gait speed and more upright posture instead of a forward lean/antalgic gait. Pt demo'd scar restrictions at deep pelvic floor mm and lower abdominal. Pt demo'd increased pelvic floor ROM post- scar massage. Pt progressed to pelvic floor strengthening. Anticipate improved fascial mobility in her abdominopelvic area witih improve her radiating posterior thigh Sx. Pt continues to benefit from skilled PT.     Pt will benefit from skilled therapeutic intervention in order to improve on the following deficits Abnormal gait;Decreased strength;Increased muscle spasms;Improper body mechanics;Postural dysfunction;Decreased mobility;Impaired flexibility;Decreased activity tolerance;Decreased balance;Hypomobility;Decreased safety awareness;Pain;Impaired UE functional use;Decreased coordination;Decreased endurance;Decreased scar mobility;Increased fascial restricitons   Rehab Potential Good   PT Frequency 1x / week   PT Duration 12 weeks   PT Treatment/Interventions ADLs/Self Care Home Management;Neuromuscular re-education;Balance training;Cryotherapy;Biofeedback;Aquatic Therapy;Gait training;Stair training;Functional mobility training;Therapeutic activities;Therapeutic  exercise;Manual techniques;Scar mobilization;Moist Heat;Patient/family education;Traction;Taping;Energy conservation;Dry needling;Passive range of motion   PT Next Visit Plan assess feet as it impacts walking (considering Hx of achilles tendinitis)   Consulted and Agree with Plan of Care Patient        Problem List Patient Active Problem List   Diagnosis Date Noted  . Essential hypertension 07/29/2015  . Mixed hyperlipidemia 07/29/2015  . Menopausal symptoms 07/29/2015  . Tobacco use disorder, moderate, in sustained remission 07/29/2015  . Degeneration of intervertebral disc of  lumbar region 06/17/2015  . 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 Sacroiliac strain 01/28/2015    Jerl Mina ,PT, DPT, E-RYT 07/30/2015, 11:54 PM  [place] MAIN [facility] SERVICES [address] [place], [place], [address] Phone: [phone]   Fax:  [phone]  Name: YANINA KNUPP MRN: [id number] Date of Birth: [birth year]

## 2015-08-01 ENCOUNTER — Ambulatory Visit: Payer: Medicare Other | Admitting: Physical Therapy

## 2015-08-01 DIAGNOSIS — M791 Myalgia: Secondary | ICD-10-CM | POA: Diagnosis not present

## 2015-08-01 DIAGNOSIS — IMO0001 Reserved for inherently not codable concepts without codable children: Secondary | ICD-10-CM

## 2015-08-01 DIAGNOSIS — Z7409 Other reduced mobility: Secondary | ICD-10-CM

## 2015-08-01 DIAGNOSIS — M609 Myositis, unspecified: Secondary | ICD-10-CM | POA: Diagnosis not present

## 2015-08-01 NOTE — Patient Instructions (Signed)
Back stretching    Open book 15 x each side      Single knee to chest with belt   10 x each side       Reclined twist  (scoot hips over to side and then drop knees to opposite side, pillow between knees and below both knees, look  In direction opp of knee)  5 breaths each side      Child's pose rocking

## 2015-08-01 NOTE — Therapy (Addendum)
[place] MAIN [facility] SERVICES [address] [place], [place], [address] Phone: [phone]   Fax:  [phone]  Physical Therapy Treatment  Patient Details  Name: Monique Hall MRN: [id number] Date of Birth: 1941/04/01 Referring Provider: Leafy Ro  Encounter Date: 08/01/2015      PT End of Session - 08/01/15 1936    Visit Number 3   Number of Visits 12   Date for PT Re-Evaluation 10/16/15   Authorization Type G-code at 10th   PT Start Time 0910   PT Stop Time 1010   PT Time Calculation (min) 60 min   Activity Tolerance Patient tolerated treatment well;No increased pain   Behavior During Therapy [facility] for tasks assessed/performed      Past Medical History  Diagnosis Date  . Gastritis   . Esophagitis   . Hypercholesteremia   . Hypertension   . Sciatica   . Tendonitis, Achilles 2016    both     Past Surgical History  Procedure Laterality Date  . Abdominal hysterectomy  1985    total for bleeding  . Appendectomy    . Bladder suspension  2004    There were no vitals filed for this visit.  Visit Diagnosis:  Myalgia and myositis  Decreased mobility and endurance      Subjective Assessment - 08/01/15 0917    Subjective Pt noticed she was not as tight after last session. Pt was able to make her bed and cook without stopping for rest breaks due to pain. Pt kept her great grand son yesterday and was able to hold/lift him without having back pain. Pt 's pain after last session 3/10 and today her pain has increased to 4/10 due to increased activities after a few days of low activitty.  Radiating pain has decreased by 20% and is "not as constant".     Pertinent History Hx of vaginal mesh for incontinence with success, 3 pregnancies (vaginal with tears and stitches), hysterectomy 1980, appendectomy 1969, Achilles tendonitis (improved).     How long can you stand comfortably? 15-20 min   How long can you walk comfortably? after walking in  [employer] 30 min    Patient Stated Goals 1) wash dishes and make bed without breaks, 2) spend time with great granchildren, 3) walking in the yard             [facility] PT Assessment - 08/01/15 1029    Observation/Other Assessments   Observations upright sitting but with forward lean    Coordination   Gross Motor Movements are Fluid and Coordinated --  extreme difficulty with cat/cow, segmental movement of spine   AROM   Overall AROM Comments spinal ext with pain at SIJ pre-Tx, no pain post-Tx   Flexibility   Soft Tissue Assessment /Muscle Length --  able to perform hip flexion w/o pain    Palpation   Spinal mobility inreased paraspinal mm tensions bilaterally   SI assessment  SIJ symmetry noted                      [facility] Adult PT Treatment/Exercise - 08/01/15 1937    Self-Care   Self-Care --  reviewed HEP, explained importance spinal flexibilty    Neuro Re-ed    Neuro Re-ed Details  see pt instructions,   cued for increased posterior diaphragmatic expansions   Exercises   Exercises --  see pt instructions   Manual Therapy   Joint Mobilization Grade III PA on  SP along T3-12, CVJ bilateral   Soft tissue mobilization paraspinals bilaterally  intercostals                     PT Long Term Goals - 08/01/15 1942    PT LONG TERM GOAL #1   Title Pt will be able to wash dishes and make bed without breaks across 3 days in order to return to ADLs.    Time 12   Period Weeks   Status On-going   PT LONG TERM GOAL #2   Title Pt will decrease her [place] score from 22% to < 12%      in order to improve QOL.    Time 12   Period Weeks   Status On-going   PT LONG TERM GOAL #3   Title Pt will decrease her ODI score from 22% to < 12%        in order to return to shopping, lifting.    Time 12   Period Weeks   Status On-going   PT LONG TERM GOAL #4   Title Pt will show no lumbopelvic instability with deep core exercise level 1-4 in order to progress to loaded  activities like holding great grandson.    Time 12   Period Weeks   Status On-going   PT LONG TERM GOAL #5   Title Pt will demo proper lifting mechanics with lifting 15# and carrying it across 10 ft in order to lift  great grandson from a pack and play and walk him across the room.    Time 12   Period Weeks   Status On-going   Additional Long Term Goals   Additional Long Term Goals Yes   PT LONG TERM GOAL #6   Title Pt will demo increased gait speed from .83 m/s to >1.0 m/s in order progress towards community ambulator speed to cross sidewalks safely.   Time 12   Period Weeks   Status Partially Met   PT LONG TERM GOAL #7   Title Pt will demo ability with cat/cow without tactile/ verbal cuing in order to maintain flexbility of spine and to minimize risk for relapse.    Time 12   Period Weeks   Status New               Plan - 08/01/15 1940    Clinical Impression Statement Pt reported a decrease from her 4/10 to 3/10 after Tx. Pt is progressing well with improved gait but required increased manual Tx and ther ex to maintain spinal flexibility. Added more flexbility stretches in her POC. Pt showed coordination difficulty with segmental spinal movement and will benefit from continued neuro-muscular education with skilled PT.    Pt will benefit from skilled therapeutic intervention in order to improve on the following deficits Abnormal gait;Decreased strength;Increased muscle spasms;Improper body mechanics;Postural dysfunction;Decreased mobility;Impaired flexibility;Decreased activity tolerance;Decreased balance;Hypomobility;Decreased safety awareness;Pain;Impaired UE functional use;Decreased coordination;Decreased endurance;Decreased scar mobility;Increased fascial restricitons   Rehab Potential Good   PT Frequency 1x / week   PT Duration 12 weeks   PT Treatment/Interventions ADLs/Self Care Home Management;Neuromuscular re-education;Balance training;Cryotherapy;Biofeedback;Aquatic  Therapy;Gait training;Stair training;Functional mobility training;Therapeutic activities;Therapeutic exercise;Manual techniques;Scar mobilization;Moist Heat;Patient/family education;Traction;Taping;Energy conservation;Dry needling;Passive range of motion        Problem List Patient Active Problem List   Diagnosis Date Noted  . Essential hypertension 07/29/2015  . Mixed hyperlipidemia 07/29/2015  . Menopausal symptoms 07/29/2015  . Tobacco use disorder, moderate, in sustained remission 07/29/2015  . Degeneration of intervertebral  disc of lumbar region 06/17/2015  . 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 Sacroiliac strain 01/28/2015    Jerl Mina ,PT, DPT, E-RYT  08/01/2015, 7:44 PM  [place] MAIN [facility] SERVICES [address] [place], [place], [address] Phone: [phone]   Fax:  [phone]  Name: Monique Hall MRN: [id number] Date of Birth: [birth year]

## 2015-08-04 ENCOUNTER — Ambulatory Visit: Payer: Medicare Other | Admitting: Physical Therapy

## 2015-08-04 DIAGNOSIS — IMO0001 Reserved for inherently not codable concepts without codable children: Secondary | ICD-10-CM

## 2015-08-04 DIAGNOSIS — Z7409 Other reduced mobility: Secondary | ICD-10-CM | POA: Diagnosis not present

## 2015-08-04 DIAGNOSIS — M791 Myalgia: Secondary | ICD-10-CM | POA: Diagnosis not present

## 2015-08-04 DIAGNOSIS — M609 Myositis, unspecified: Secondary | ICD-10-CM | POA: Diagnosis not present

## 2015-08-04 NOTE — Patient Instructions (Addendum)
Laying down: hamstring Stretch with belt, alternating with bent knees and then straighten (knee is bent towards to armpit) while the other foot,knee is stable.  5x   Pelvic floor exercises 3 reps 10 sec holds with 4 breaths for rest break    Walking mechanics: swing arms and thigh high (lifting feet up)

## 2015-08-05 NOTE — Therapy (Addendum)
[place] MAIN [facility] SERVICES [address] [place], [place], [address] Phone: [phone]   Fax:  [phone]  Physical Therapy Treatment  Patient Details  Name: Monique Hall MRN: [id number] Date of Birth: 1941/07/10 Referring Provider: Leafy Ro  Encounter Date: 08/04/2015      PT End of Session - 08/05/15 0704    Visit Number 4   Date for PT Re-Evaluation 10/16/15   Authorization Type G-code at 10th   PT Start Time 0900   PT Stop Time 1005   PT Time Calculation (min) 65 min   Activity Tolerance Patient tolerated treatment well;No increased pain   Behavior During Therapy [facility] for tasks assessed/performed      Past Medical History  Diagnosis Date  . Gastritis   . Esophagitis   . Hypercholesteremia   . Hypertension   . Sciatica   . Tendonitis, Achilles 2016    both     Past Surgical History  Procedure Laterality Date  . Abdominal hysterectomy  1985    total for bleeding  . Appendectomy    . Bladder suspension  2004    There were no vitals filed for this visit.  Visit Diagnosis:  Myalgia and myositis  Decreased mobility and endurance      Subjective Assessment - 08/04/15 0909    Subjective Pt reports she left last session with less pain. Pt's pain today is a 3-4/10. Pt states she has not experienced radiating pain for the past two days. Pt's c/o achiness behind proximal hamstrings. Pt went shopping at [facility] and felt a "big improvement" because she did not feel increased pain after walking and pushing great-grandson in shopping cart.     Pertinent History Hx of vaginal mesh for incontinence with success, 3 pregnancies (vaginal with tears and stitches), hysterectomy 1980, appendectomy 1969, Achilles tendonitis (improved).     How long can you stand comfortably? 15-20 min   How long can you walk comfortably? after walking in [employer] 30 min    Patient Stated Goals 1) wash dishes and make bed without breaks, 2) spend time with  great granchildren, 3) walking in the yard             [facility] PT Assessment - 08/05/15 0001    Assessment   Medical Diagnosis back pain    Observation/Other Assessments   Observations upright sitting but slight shoulder IR     Other Surveys  --  PDI 14% (22% at [facility]) , ODI 12% (22% at [facility] 07/24/15)   Flexibility   Hamstrings WFL but with pain (pre-Tx), and no pain post-Tx   Palpation   SI assessment  SIJ symmetry noted    Palpation comment increased tensions of mm and fascial of hamstrings (bicep femoris)  and adductor magnus R     Bed Mobility   Bed Mobility --  log roll w/o cues   Ambulation/Gait   Ambulation Distance (Feet) 10 Feet   Gait velocity 1.07 m/s   (0.83 m/s at [facility] )   Gait Comments required cuing for arm swing and hip flexion                   Pelvic Floor Special Questions - 08/05/15 0701    Pelvic Floor Internal Exam pt consented verbally and had no contraindications   Exam Type Vaginal   Palpation increased mm tensions/ tenderness on R coccygeus , decreased post-Tx  increased scar restriction, iliocyccygeus near coccyx bil   Strength fair squeeze, definite  lift  posterior > ant   Strength # of reps 3   Strength # of seconds 10   Biofeedback decreased cuing for adductor overuse, cuing for less strain with breathing           [facility] Adult PT Treatment/Exercise - 08/05/15 0701    Bed Mobility   Bed Mobility --  log roll w/o cues   Ambulation/Gait   Ambulation Distance (Feet) 10 Feet   Gait velocity 1.07 m/s   (0.83 m/s at [facility] )   Gait Comments required cuing for arm swing and hip flexion    Neuro Re-ed    Neuro Re-ed Details  pelvic floor coordination, alignment for hamstring stretch (R)   sitting posture    Manual Therapy   Soft tissue mobilization STM over R hamstrings (bicep femoris, R adductor magnus (noted decreased mm tensions with ability to perform SLR w/o pain post-Tx)                 PT Education - 08/05/15 0704     Education provided Yes   Education Details HEP   Person(s) Educated Patient   Methods Explanation;Demonstration;Tactile cues;Verbal cues;Handout   Comprehension Verbalized understanding;Returned demonstration             PT Long Term Goals - 08/04/15 1002    PT LONG TERM GOAL #1   Title Pt will be able to wash dishes and make bed without breaks across 3 days in order to return to ADLs.    Time 12   Period Weeks   Status Achieved   PT LONG TERM GOAL #2   Title Pt will decrease her [place] score from 22% to < 12%      in order to improve QOL. (08/04/15: 14%)   Time 12   Period Weeks   Status Partially Met   PT LONG TERM GOAL #3   Title Pt will decrease her ODI score from 22% to < 12%        in order to return to shopping, lifting.  1/16: 12%)   Time 12   Period Weeks   Status Achieved   PT LONG TERM GOAL #4   Title Pt will show no lumbopelvic instability with deep core exercise level 1-4 in order to progress to loaded activities like holding great grandson.    Time 12   Period Weeks   Status Partially Met   PT LONG TERM GOAL #5   Title Pt will demo proper lifting mechanics with lifting 15# and carrying it across 10 ft in order to lift  great grandson from a pack and play and walk him across the room.    Time 12   Period Weeks   Status Partially Met   PT LONG TERM GOAL #6   Title Pt will demo increased gait speed from .83 m/s to >1.0 m/s in order progress towards community ambulator speed to cross sidewalks safely.  (08/04/15: 1.07 m/s)    Time 12   Period Weeks   Status Achieved   PT LONG TERM GOAL #7   Title Pt will demo ability with cat/cow without tactile/ verbal cuing in order to maintain flexbility of spine and to minimize risk for relapse.    Time 12   Period Weeks   Status New               Plan - 08/05/15 0707    Clinical Impression Statement  Pt's remaining radicular symptoms have centralized w/ the introduction of  pelvic floor endurance training and  breathing coordination into her HEP.  Pt reported at her 4th session today that she did not experience radicular Sx for the past 2 days and was able to walk at [facility] and push her greatgrandson in the cart without pain during and after the activity. Pt continues to show decreased mm tensions in thoracic area with increased spinal mobility with had been the original source of her radicular Sx. Pt also showed proper breathing coordination w/ optimal diaphragmatic function for stabilization of spine. Pt has achieved 3/7 goals including signficantly decreased scores on ODI and PDI. Anticipate pt will  progress well towards her remaining goals with continued skilled pelvic health PT.      Pt will benefit from skilled therapeutic intervention in order to improve on the following deficits Abnormal gait;Decreased strength;Increased muscle spasms;Improper body mechanics;Postural dysfunction;Decreased mobility;Impaired flexibility;Decreased activity tolerance;Decreased balance;Hypomobility;Decreased safety awareness;Pain;Impaired UE functional use;Decreased coordination;Decreased endurance;Decreased scar mobility;Increased fascial restricitons   Rehab Potential Good   PT Frequency 1x / week   PT Duration 12 weeks   PT Treatment/Interventions ADLs/Self Care Home Management;Neuromuscular re-education;Balance training;Cryotherapy;Biofeedback;Aquatic Therapy;Gait training;Stair training;Functional mobility training;Therapeutic activities;Therapeutic exercise;Manual techniques;Scar mobilization;Moist Heat;Patient/family education;Traction;Taping;Energy conservation;Dry needling;Passive range of motion   PT Next Visit Plan assess feet as it impacts walking (considering Hx of achilles tendinitis)   Consulted and Agree with Plan of Care Patient          G-Codes - 08/13/15 1032    Functional Assessment Tool Used ODI 12% , PDI 14%    Functional Limitation Mobility: Walking and moving around   Mobility: Walking and  Moving Around Current Status (G2563) At least 1 percent but less than 20 percent impaired, limited or restricted   Mobility: Walking and Moving Around Goal Status [phone]) 0 percent impaired, limited or restricted      Problem List Patient Active Problem List   Diagnosis Date Noted  . Essential hypertension 07/29/2015  . Mixed hyperlipidemia 07/29/2015  . Menopausal symptoms 07/29/2015  . Tobacco use disorder, moderate, in sustained remission 07/29/2015  . Degeneration of intervertebral disc of lumbar region 06/17/2015  . 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 Sacroiliac strain 01/28/2015    Jerl Mina ,PT, DPT, E-RYT  08/05/2015, 7:18 AM  [place] MAIN [facility] SERVICES [address] [place], [place], [address] Phone: [phone]   Fax:  [phone]  Name: JERIAH SKUFCA MRN: [id number] Date of Birth: 1941-02-18

## 2015-08-06 DIAGNOSIS — M5416 Radiculopathy, lumbar region: Secondary | ICD-10-CM | POA: Diagnosis not present

## 2015-08-06 DIAGNOSIS — M4806 Spinal stenosis, lumbar region: Secondary | ICD-10-CM | POA: Diagnosis not present

## 2015-08-06 DIAGNOSIS — M5136 Other intervertebral disc degeneration, lumbar region: Secondary | ICD-10-CM | POA: Diagnosis not present

## 2015-08-08 ENCOUNTER — Ambulatory Visit: Payer: Medicare Other | Admitting: Physical Therapy

## 2015-08-08 DIAGNOSIS — IMO0001 Reserved for inherently not codable concepts without codable children: Secondary | ICD-10-CM

## 2015-08-08 DIAGNOSIS — Z7409 Other reduced mobility: Secondary | ICD-10-CM

## 2015-08-08 DIAGNOSIS — M609 Myositis, unspecified: Secondary | ICD-10-CM | POA: Diagnosis not present

## 2015-08-08 DIAGNOSIS — M791 Myalgia: Secondary | ICD-10-CM | POA: Diagnosis not present

## 2015-08-08 NOTE — Patient Instructions (Signed)
Squat   Feet shoulder width apart, a few inches in front of wall, lean against wall by squeezing shoulder blades together and back of head pressed against the wall. Inhale as you lower by bending hips and knees just enough that you can still see your toes.  Exhale, engage deep core muscles as you rise up to stand. Repeat _10___ times. Do _2-3___ sessions per day.  Copyright  VHI. All rights reserved.     __________________________   Wall elbow and head press (unlock knees)  5 sec holds with pelvic floor lift , counting aloud  X 5 reps / day    __________________________  Elinor Parkinson over thigh stretch w/ belt  5 breaths x 3    ___________________________  Body scan each night or during the day to calm the nervous system    ___________________________  Standing with unloacked knee with pressure more between feet and heels not just the heels   Rest breaks at 20 min of standing ,and perform sidebend stretches.

## 2015-08-08 NOTE — Therapy (Signed)
[place] MAIN [facility] SERVICES [address] [place], [place], [address] Phone: [phone]   Fax:  [phone]  Physical Therapy Treatment  Patient Details  Name: Monique Hall MRN: [id number] Date of Birth: May 12, 1941 Referring Provider: Leafy Ro  Encounter Date: 08/08/2015      PT End of Session - 08/08/15 1005    Visit Number 5   Date for PT Re-Evaluation 10/16/15   Authorization Type G-code at 10th   PT Start Time 0920   PT Stop Time 1008   PT Time Calculation (min) 48 min   Activity Tolerance Patient tolerated treatment well;No increased pain   Behavior During Therapy [facility] for tasks assessed/performed      Past Medical History  Diagnosis Date  . Gastritis   . Esophagitis   . Hypercholesteremia   . Hypertension   . Sciatica   . Tendonitis, Achilles 2016    both     Past Surgical History  Procedure Laterality Date  . Abdominal hysterectomy  1985    total for bleeding  . Appendectomy    . Bladder suspension  2004    There were no vitals filed for this visit.  Visit Diagnosis:  Myalgia and myositis  Decreased mobility and endurance      Subjective Assessment - 08/08/15 0934    Subjective Pt feels 60% improvement but with lingering radiating pain to mid posterior thigh with standing after 20-30 min. The radiating pain no longer ends behind the knee.  Today her pain is 4/10 pain    Pertinent History Hx of vaginal mesh for incontinence with success, 3 pregnancies (vaginal with tears and stitches), hysterectomy 1980, appendectomy 1969, Achilles tendonitis (improved).     How long can you stand comfortably? 15-20 min   How long can you walk comfortably? after walking in [employer] 30 min    Patient Stated Goals 1) wash dishes and make bed without breaks, 2) spend time with great granchildren, 3) walking in the yard             [facility] PT Assessment - 08/08/15 0938    Observation/Other Assessments   Observations slight  forward lean in gait /standing    AROM   Overall AROM Comments radiating pain w/ forward flexion. no pain after spinal stretches sidebend L/R 5x and scapular/cervical retraction by wall exercises    Palpation   Spinal mobility increased paraspinal mm tension/ bilaterally     SI assessment  small of mm tensions at R lateral edge of sacrum, decreased with manual Tx                     [facility] Adult PT Treatment/Exercise - 08/08/15 0954    Self-Care   Self-Care --  guided body scan technique, explained science of pain    Neuro Re-ed    Neuro Re-ed Details  depression of traps and upright posture   body scan and pain science   Exercises   Other Exercises  wall mini squats 30reps, cross over thighs stretch w/ belt , pelvic floor holds with scap retraction 10 secs x 3    Manual Therapy   Joint Mobilization sidelying roataion mob R    Soft tissue mobilization deep  STM along R lateral edge of sacrum                PT Education - 08/08/15 1004    Education provided Yes   Education Details HEP, pain science, posture alignment  Person(s) Educated Patient   Methods Explanation;Demonstration;Tactile cues;Verbal cues;Handout   Comprehension Verbalized understanding;Returned demonstration             PT Long Term Goals - 08/04/15 1002    PT LONG TERM GOAL #1   Title Pt will be able to wash dishes and make bed without breaks across 3 days in order to return to ADLs.    Time 12   Period Weeks   Status Achieved   PT LONG TERM GOAL #2   Title Pt will decrease her [place] score from 22% to < 12%      in order to improve QOL. (08/04/15: 14%)   Time 12   Period Weeks   Status Partially Met   PT LONG TERM GOAL #3   Title Pt will decrease her ODI score from 22% to < 12%        in order to return to shopping, lifting.  1/16: 12%)   Time 12   Period Weeks   Status Achieved   PT LONG TERM GOAL #4   Title Pt will show no lumbopelvic instability with deep core exercise level  1-4 in order to progress to loaded activities like holding great grandson.    Time 12   Period Weeks   Status Partially Met   PT LONG TERM GOAL #5   Title Pt will demo proper lifting mechanics with lifting 15# and carrying it across 10 ft in order to lift  great grandson from a pack and play and walk him across the room.    Time 12   Period Weeks   Status Partially Met   PT LONG TERM GOAL #6   Title Pt will demo increased gait speed from .83 m/s to >1.0 m/s in order progress towards community ambulator speed to cross sidewalks safely.  (08/04/15: 1.07 m/s)    Time 12   Period Weeks   Status Achieved   PT LONG TERM GOAL #7   Title Pt will demo ability with cat/cow without tactile/ verbal cuing in order to maintain flexbility of spine and to minimize risk for relapse.    Time 12   Period Weeks   Status New               Plan - 08/08/15 1006    Clinical Impression Statement Pt reported a decrease in pain from 4/10 to 3/10 without raidating pain down her R LE. Initaited upright posterior chain strengthening w/ pelvic floor strengthening combines. Added piriformis stretch to increase sacral mobilty on R. Added pain science education with body scan technique because pt epxressed she felt she is still worried about the pain and compensates with forward trunk lean. Pt voiced  understanding to recorrect and not compensate her posture anymore. Pt demo'd proper upright posture and more relaxed demeanor  after  body scan technique.     Pt will benefit from skilled therapeutic intervention in order to improve on the following deficits Abnormal gait;Decreased strength;Increased muscle spasms;Improper body mechanics;Postural dysfunction;Decreased mobility;Impaired flexibility;Decreased activity tolerance;Decreased balance;Hypomobility;Decreased safety awareness;Pain;Impaired UE functional use;Decreased coordination;Decreased endurance;Decreased scar mobility;Increased fascial restricitons   Rehab  Potential Good   PT Frequency 1x / week   PT Duration 12 weeks   PT Treatment/Interventions ADLs/Self Care Home Management;Neuromuscular re-education;Balance training;Cryotherapy;Biofeedback;Aquatic Therapy;Gait training;Stair training;Functional mobility training;Therapeutic activities;Therapeutic exercise;Manual techniques;Scar mobilization;Moist Heat;Patient/family education;Traction;Taping;Energy conservation;Dry needling;Passive range of motion   PT Next Visit Plan assess feet as it impacts walking (considering Hx of achilles tendinitis)   Consulted and Agree with  Plan of Care Patient        Problem List Patient Active Problem List   Diagnosis Date Noted  . Essential hypertension 07/29/2015  . Mixed hyperlipidemia 07/29/2015  . Menopausal symptoms 07/29/2015  . Tobacco use disorder, moderate, in sustained remission 07/29/2015  . Degeneration of intervertebral disc of lumbar region 06/17/2015  . 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 Sacroiliac strain 01/28/2015    Jerl Mina ,PT, DPT, E-RYT  08/08/2015, 10:46 AM  [place] MAIN [facility] SERVICES [address] [place], [place], [address] Phone: [phone]   Fax:  [phone]  Name: Monique Hall MRN: [id number] Date of Birth: Nov 27, 1940

## 2015-08-13 ENCOUNTER — Ambulatory Visit: Payer: Medicare Other | Admitting: Physical Therapy

## 2015-08-20 ENCOUNTER — Ambulatory Visit: Payer: Medicare Other | Attending: Obstetrics and Gynecology | Admitting: Physical Therapy

## 2015-08-20 DIAGNOSIS — Z7409 Other reduced mobility: Secondary | ICD-10-CM | POA: Diagnosis not present

## 2015-08-20 DIAGNOSIS — IMO0001 Reserved for inherently not codable concepts without codable children: Secondary | ICD-10-CM

## 2015-08-20 DIAGNOSIS — M791 Myalgia: Secondary | ICD-10-CM | POA: Insufficient documentation

## 2015-08-20 DIAGNOSIS — M609 Myositis, unspecified: Secondary | ICD-10-CM | POA: Diagnosis not present

## 2015-08-20 NOTE — Patient Instructions (Addendum)
Mini wall push ups  (without hiking shoulders up to ears)  exhale as you push wall away   10 x 2       ------------------  pect stretch by wall 5 breaths       ___________    Cat cow           ____________    Lat pulls w/ yellow band    Reset with shoulders down before each pull   10x with left foot forward, 10 x with right foot forward   = 2 xsets

## 2015-08-20 NOTE — Therapy (Signed)
[place] MAIN [facility] SERVICES [address] [place], [place], [address] Phone: [phone]   Fax:  [phone]  Physical Therapy Treatment  Patient Details  Name: Monique Hall MRN: [id number] Date of Birth: 11-24-1940 Referring Provider: Leafy Ro  Encounter Date: 08/20/2015      PT End of Session - 08/20/15 1016    Visit Number 6   Number of Visits 12   Date for PT Re-Evaluation 10/16/15   Authorization Type G-code at 10th   PT Start Time 0904   PT Stop Time 0955   PT Time Calculation (min) 51 min   Activity Tolerance Patient tolerated treatment well;No increased pain   Behavior During Therapy [facility] for tasks assessed/performed      Past Medical History  Diagnosis Date  . Gastritis   . Esophagitis   . Hypercholesteremia   . Hypertension   . Sciatica   . Tendonitis, Achilles 2016    both     Past Surgical History  Procedure Laterality Date  . Abdominal hysterectomy  1985    total for bleeding  . Appendectomy    . Bladder suspension  2004    There were no vitals filed for this visit.  Visit Diagnosis:  Myalgia and myositis  Decreased mobility and endurance      Subjective Assessment - 08/20/15 0908    Subjective Pt noticed she has not had radiating pain for a month. Her hamstring tightness came back when she was not doing her HEP 2/2 illness. She felt better in her hamstrings after returning to HEP.    Pertinent History Hx of vaginal mesh for incontinence with success, 3 pregnancies (vaginal with tears and stitches), hysterectomy 1980, appendectomy 1969, Achilles tendonitis (improved).     How long can you stand comfortably? 15-20 min   How long can you walk comfortably? after walking in [employer] 30 min    Patient Stated Goals 1) wash dishes and make bed without breaks, 2) spend time with great granchildren, 3) walking in the yard             [facility] PT Assessment - 08/20/15 1013    Observation/Other Assessments   Observations more upright sitting and walking without cuing,   minor cuing for hiking of upper traps   Other Surveys  --  PDI 8%    Coordination   Gross Motor Movements are Fluid and Coordinated --  overactivation of upper trap in coordination exercises                     [place] Adult PT Treatment/Exercise - 08/20/15 1012    Therapeutic Activites    Therapeutic Activities --  adminstered PDI , discussed goals, POC   Neuro Re-ed    Neuro Re-ed Details  scap depression and retractin in exercsies, coordination of segmental spinal movement in cat cow    Exercises   Other Exercises  see pt instructions                 PT Education - 08/20/15 1011    Education provided Yes   Education Details HEP   Person(s) Educated Patient   Methods Explanation;Demonstration;Tactile cues;Verbal cues;Handout   Comprehension Verbalized understanding;Returned demonstration             PT Long Term Goals - 08/20/15 0034    PT LONG TERM GOAL #1   Title Pt will be able to wash dishes and make bed without breaks across 3 days in order  to return to ADLs.    Time 12   Period Weeks   Status Achieved   PT LONG TERM GOAL #2   Title Pt will decrease her [place] score from 22% to < 12%      in order to improve QOL. (08/04/15:  14% __2/1: 8%)    Time 12   Period Weeks   Status Achieved   PT LONG TERM GOAL #3   Title Pt will decrease her ODI score from 22% to < 12%        in order to return to shopping, lifting.  1/16: 12%)   Time 12   Period Weeks   Status Achieved   PT LONG TERM GOAL #4   Title Pt will show no lumbopelvic instability with deep core exercise level 1-4 in order to progress to loaded activities like holding great grandson.    Time 12   Period Weeks   Status Partially Met   PT LONG TERM GOAL #5   Title Pt will demo proper lifting mechanics with lifting 15# and carrying it across 10 ft in order to lift  great grandson from a pack and play and walk him across the room.     Time 12   Period Weeks   Status Partially Met   Additional Long Term Goals   Additional Long Term Goals Yes   PT LONG TERM GOAL #6   Title Pt will demo increased gait speed from .83 m/s to >1.0 m/s in order progress towards community ambulator speed to cross sidewalks safely.  (08/04/15: 1.07 m/s)    Time 12   Period Weeks   Status Achieved   PT LONG TERM GOAL #7   Title Pt will demo ability with cat/cow without tactile/ verbal cuing in order to maintain flexbility of spine and to minimize risk for relapse.    Time 12   Period Weeks   Status Achieved   PT LONG TERM GOAL #8   Title Pt will demo no overactivation of upper trap with wall push-ups nor lat pull down 5 reps with yellow band in order to maintain proper coordination and avoid faulty motor patterns to minimize risk fo relapse of Sx.    Time 12   Period Weeks   Status New               Plan - 08/20/15 1016    Clinical Impression Statement Pt 's radicular Sx has been resolved for a month and now pt is working on proper motor control patterns to minimize risk for relapse of Sx. Pt decreased her [place] score significantly and shows more upright posture in sitting and gait without cuing. However, pt demonstrated overactivation of upper trap and required neuro reeducation for thoracolumbar activation (scap depression /retraction). Pt demo'd correctly after training and will continue to progress towards her goals.    Pt will benefit from skilled therapeutic intervention in order to improve on the following deficits Abnormal gait;Decreased strength;Increased muscle spasms;Improper body mechanics;Postural dysfunction;Decreased mobility;Impaired flexibility;Decreased activity tolerance;Decreased balance;Hypomobility;Decreased safety awareness;Pain;Impaired UE functional use;Decreased coordination;Decreased endurance;Decreased scar mobility;Increased fascial restricitons   Rehab Potential Good   PT Frequency 1x / week   PT Duration 12  weeks   PT Treatment/Interventions ADLs/Self Care Home Management;Neuromuscular re-education;Balance training;Cryotherapy;Biofeedback;Aquatic Therapy;Gait training;Stair training;Functional mobility training;Therapeutic activities;Therapeutic exercise;Manual techniques;Scar mobilization;Moist Heat;Patient/family education;Traction;Taping;Energy conservation;Dry needling;Passive range of motion   PT Next Visit Plan assess feet as it impacts walking (considering Hx of achilles tendinitis)   Consulted and Agree with Plan  of Care Patient        Problem List Patient Active Problem List   Diagnosis Date Noted  . Essential hypertension 07/29/2015  . Mixed hyperlipidemia 07/29/2015  . Menopausal symptoms 07/29/2015  . Tobacco use disorder, moderate, in sustained remission 07/29/2015  . Degeneration of intervertebral disc of lumbar region 06/17/2015  . 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 Sacroiliac strain 01/28/2015    Sara Chu, DPT, E-RYT  08/20/2015, 10:19 AM  [place] MAIN [facility] SERVICES [address] [place], [place], [address] Phone: [phone]   Fax:  [phone]  Name: CUCA BENASSI MRN: [id number] Date of Birth: 05-Nov-1940

## 2015-08-27 ENCOUNTER — Ambulatory Visit: Payer: Medicare Other | Admitting: Physical Therapy

## 2015-08-27 DIAGNOSIS — M609 Myositis, unspecified: Secondary | ICD-10-CM | POA: Diagnosis not present

## 2015-08-27 DIAGNOSIS — Z7409 Other reduced mobility: Secondary | ICD-10-CM

## 2015-08-27 DIAGNOSIS — IMO0001 Reserved for inherently not codable concepts without codable children: Secondary | ICD-10-CM

## 2015-08-27 DIAGNOSIS — M791 Myalgia: Secondary | ICD-10-CM | POA: Diagnosis not present

## 2015-08-28 NOTE — Therapy (Signed)
[place] MAIN [facility] SERVICES [address] [place], [place], [address] Phone: [phone]   Fax:  [phone]  Physical Therapy Treatment /Discharge Summary   Patient Details  Name: Monique Hall MRN: [id number] Date of Birth: 26-Jan-1941 Referring Provider: Leafy Ro  Encounter Date: 08/27/2015      PT End of Session - 08/28/15 1909    Visit Number 7   Number of Visits 12   Date for PT Re-Evaluation 10/16/15   Authorization Type G-code at 10th   PT Start Time 0900   PT Stop Time 0950   PT Time Calculation (min) 50 min   Activity Tolerance Patient tolerated treatment well;No increased pain   Behavior During Therapy [facility] for tasks assessed/performed      Past Medical History  Diagnosis Date  . Gastritis   . Esophagitis   . Hypercholesteremia   . Hypertension   . Sciatica   . Tendonitis, Achilles 2016    both     Past Surgical History  Procedure Laterality Date  . Abdominal hysterectomy  1985    total for bleeding  . Appendectomy    . Bladder suspension  2004    There were no vitals filed for this visit.  Visit Diagnosis:  Myalgia and myositis  Decreased mobility and endurance      Subjective Assessment - 08/28/15 1902    Subjective Pt reported she has not experienced any pain for over a month. Pt only feels tightness in her hamstring and she continues to stretch it out. Pt has practiced her HEP and is ready for D/C today.   Pertinent History Hx of vaginal mesh for incontinence with success, 3 pregnancies (vaginal with tears and stitches), hysterectomy 1980, appendectomy 1969, Achilles tendonitis (improved).     How long can you stand comfortably? 15-20 min   How long can you walk comfortably? after walking in [employer] 30 min    Patient Stated Goals 1) wash dishes and make bed without breaks, 2) spend time with great granchildren, 3) walking in the yard             [facility] PT Assessment - 08/28/15 0934     Observation/Other Assessments   Observations no forward lean in gait and walking and sitting   Coordination   Gross Motor Movements are Fluid and Coordinated --  minor cuing for segmental mobility in cat cow posture   Other:   Other/ Comments lifting 15 # kettle bell  5 reps with minor cuing for wider BOS   Bed Mobility   Bed Mobility --  required log rolling cues                     [facility] Adult PT Treatment/Exercise - 08/28/15 0934    Self-Care   Self-Care --  educ: flexibility routine after d/c, recommended yoga DVD   Therapeutic Activites    Therapeutic Activities --  lifting mechanics 15# and walking 10 ft   Neuro Re-ed    Neuro Re-ed Details  minimal cues for segmental mobility in cat cow  cued deep core 3-4 ex (Level 4 UE to 90 flex/opp heel slide)                PT Education - 08/28/15 1911    Education provided Yes   Education Details HEP, d/c w/ continuation of flexibility program    Person(s) Educated Patient   Methods Explanation;Demonstration;Tactile cues;Verbal cues;Handout   Comprehension Returned demonstration;Verbalized understanding  PT Long Term Goals - 08/27/15 0909    PT LONG TERM GOAL #1   Title Pt will be able to wash dishes and make bed without breaks across 3 days in order to return to ADLs.    Time 12   Period Weeks   Status Achieved   PT LONG TERM GOAL #2   Title Pt will decrease her [place] score from 22% to < 12%      in order to improve QOL. (08/04/15:  14% __2/1: 8%)    Time 12   Period Weeks   Status Achieved   PT LONG TERM GOAL #3   Title Pt will decrease her ODI score from 22% to < 12%        in order to return to shopping, lifting.  1/16: 12%)   Time 12   Period Weeks   Status Achieved   PT LONG TERM GOAL #4   Title Pt will show no lumbopelvic instability with deep core exercise level 1-4 in order to progress to loaded activities like holding great grandson.    Time 12   Period Weeks   Status  Achieved   PT LONG TERM GOAL #5   Title Pt will demo proper lifting mechanics with lifting 15# and carrying it across 10 ft in order to lift  great grandson from a pack and play and walk him across the room.    Time 12   Period Weeks   Status Achieved   PT LONG TERM GOAL #6   Title Pt will demo increased gait speed from .83 m/s to >1.0 m/s in order progress towards community ambulator speed to cross sidewalks safely.  (08/04/15: 1.07 m/s)    Time 12   Period Weeks   Status Achieved   PT LONG TERM GOAL #7   Title Pt will demo ability with cat/cow without tactile/ verbal cuing in order to maintain flexbility of spine and to minimize risk for relapse.    Time 12   Period Weeks   Status Achieved   PT LONG TERM GOAL #8   Title Pt will demo no overactivation of upper trap with wall push-ups or lat pull down 5 reps with yellow band in order to maintain proper coordination and avoid faulty motor patterns to minimize risk fo relapse of Sx.    Time 12   Period Weeks   Status Achieved               Plan - 08/28/15 1909    Clinical Impression Statement Pt has achieved 100% of all her goals. Pt rated her symptoms feel "A great deal better" based on the [facility]. Across the past 7 visits, pt has returned to ADLs with return to washing dishes, changing bed linens, lifting her great grandbaby and walking witth signficantly reduced pain. Pt 's [place] Disability Index score decreased from 22% to 12% and her Pain Disability Index score 22% to 8%. Pt showed significantly improved flexbility, more upright posture, increased deep core strength (including pelvic floor activation), and improved motor patterns (segmental mobility of spine and decreased upper trap mm overuse). Pt is ready for d/c. 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 Thank you for your referral.      Pt will benefit from skilled therapeutic intervention in order to improve on the following deficits  Abnormal gait;Decreased strength;Increased muscle spasms;Improper body mechanics;Postural dysfunction;Decreased mobility;Impaired flexibility;Decreased activity tolerance;Decreased balance;Hypomobility;Decreased safety awareness;Pain;Impaired UE functional use;Decreased coordination;Decreased  endurance;Decreased scar mobility;Increased fascial restricitons   Rehab Potential Good   PT Frequency 1x / week   PT Duration 12 weeks   PT Treatment/Interventions ADLs/Self Care Home Management;Neuromuscular re-education;Balance training;Cryotherapy;Biofeedback;Aquatic Therapy;Gait training;Stair training;Functional mobility training;Therapeutic activities;Therapeutic exercise;Manual techniques;Scar mobilization;Moist Heat;Patient/family education;Traction;Taping;Energy conservation;Dry needling;Passive range of motion   PT Next Visit Plan assess feet as it impacts walking (considering Hx of achilles tendinitis)   Consulted and Agree with Plan of Care Patient          G-Codes - September 15, 2015 1916    Functional Assessment Tool Used ODI 12% , PDI 14%    Functional Limitation Mobility: Walking and moving around   Mobility: Walking and Moving Around Current Status [phone]) At least 1 percent but less than 20 percent impaired, limited or restricted   Mobility: Walking and Moving Around Goal Status [phone]) 0 percent impaired, limited or restricted   Mobility: Walking and Moving Around Discharge Status [phone]) 0 percent impaired, limited or restricted      Problem List Patient Active Problem List   Diagnosis Date Noted  . Essential hypertension 07/29/2015  . Mixed hyperlipidemia 07/29/2015  . Menopausal symptoms 07/29/2015  . Tobacco use disorder, moderate, in sustained remission 07/29/2015  . Degeneration of intervertebral disc of lumbar region 06/17/2015  . 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 Sacroiliac strain 01/28/2015    Jerl Mina ,PT, DPT,  E-RYT  September 15, 2015, 7:16 PM  [place] MAIN [facility] SERVICES [address] [place], [place], [address] Phone: [phone]   Fax:  [phone]  Name: Monique Hall MRN: [id number] Date of Birth: March 21, 1941

## 2015-08-29 ENCOUNTER — Encounter: Payer: Self-pay | Admitting: Internal Medicine

## 2015-08-29 ENCOUNTER — Ambulatory Visit (INDEPENDENT_AMBULATORY_CARE_PROVIDER_SITE_OTHER): Payer: Medicare Other | Admitting: Internal Medicine

## 2015-08-29 VITALS — BP 150/90 | HR 88 | Ht 64.5 in | Wt 174.2 lb

## 2015-08-29 DIAGNOSIS — E782 Mixed hyperlipidemia: Secondary | ICD-10-CM | POA: Diagnosis not present

## 2015-08-29 DIAGNOSIS — F17201 Nicotine dependence, unspecified, in remission: Secondary | ICD-10-CM

## 2015-08-29 DIAGNOSIS — Z23 Encounter for immunization: Secondary | ICD-10-CM | POA: Diagnosis not present

## 2015-08-29 DIAGNOSIS — I10 Essential (primary) hypertension: Secondary | ICD-10-CM

## 2015-08-29 DIAGNOSIS — Z1231 Encounter for screening mammogram for malignant neoplasm of breast: Secondary | ICD-10-CM | POA: Diagnosis not present

## 2015-08-29 DIAGNOSIS — Z720 Tobacco use: Secondary | ICD-10-CM

## 2015-08-29 DIAGNOSIS — M5116 Intervertebral disc disorders with radiculopathy, lumbar region: Secondary | ICD-10-CM

## 2015-08-29 MED ORDER — LOSARTAN POTASSIUM-HCTZ 100-12.5 MG PO TABS: 1.0000 | ORAL_TABLET | Freq: Every day | ORAL | Status: DC

## 2015-08-29 MED ORDER — SIMVASTATIN 40 MG PO TABS: 40.0000 mg | ORAL_TABLET | Freq: Every day | ORAL | Status: DC

## 2015-08-29 NOTE — Patient Instructions (Addendum)
Health Maintenance  Topic Date Due  . MAMMOGRAM  04/27/2015 - overdue  . TETANUS/TDAP  Done today  . INFLUENZA VACCINE  02/17/2016  . COLONOSCOPY  07/20/2020  . DEXA SCAN  Addressed  . ZOSTAVAX  Completed  . PNA vac Low Risk Adult  Completed   Breast Self-Awareness Practicing breast self-awareness may pick up problems early, prevent significant medical complications, and possibly save your life. By practicing breast self-awareness, you can become familiar with how your breasts look and feel and if your breasts are changing. This allows you to notice changes early. It can also offer you some reassurance that your breast health is good. One way to learn what is normal for your breasts and whether your breasts are changing is to do a breast self-exam. If you find a lump or something that was not present in the past, it is best to contact your caregiver right away. Other findings that should be evaluated by your caregiver include nipple discharge, especially if it is bloody; skin changes or reddening; areas where the skin seems to be pulled in (retracted); or new lumps and bumps. Breast pain is seldom associated with cancer (malignancy), but should also be evaluated by a caregiver. HOW TO PERFORM A BREAST SELF-EXAM The best time to examine your breasts is 5-7 days after your menstrual period is over. During menstruation, the breasts are lumpier, and it may be more difficult to pick up changes. If you do not menstruate, have reached menopause, or had your uterus removed (hysterectomy), you should examine your breasts at regular intervals, such as monthly. If you are breastfeeding, examine your breasts after a feeding or after using a breast pump. Breast implants do not decrease the risk for lumps or tumors, so continue to perform breast self-exams as recommended. Talk to your caregiver about how to determine the difference between the implant and breast tissue. Also, talk about the amount of pressure you  should use during the exam. Over time, you will become more familiar with the variations of your breasts and more comfortable with the exam. A breast self-exam requires you to remove all your clothes above the waist.  Look at your breasts and nipples. Stand in front of a mirror in a room with good lighting. With your hands on your hips, push your hands firmly downward. Look for a difference in shape, contour, and size from one breast to the other (asymmetry). Asymmetry includes puckers, dips, or bumps. Also, look for skin changes, such as reddened or scaly areas on the breasts. Look for nipple changes, such as discharge, dimpling, repositioning, or redness.  Carefully feel your breasts. This is best done either in the shower or tub while using soapy water or when flat on your back. Place the arm (on the side of the breast you are examining) above your head. Use the pads (not the fingertips) of your three middle fingers on your opposite hand to feel your breasts. Start in the underarm area and use  inch (2 cm) overlapping circles to feel your breast. Use 3 different levels of pressure (light, medium, and firm pressure) at each circle before moving to the next circle. The light pressure is needed to feel the tissue closest to the skin. The medium pressure will help to feel breast tissue a little deeper, while the firm pressure is needed to feel the tissue close to the ribs. Continue the overlapping circles, moving downward over the breast until you feel your ribs below your breast. Then, move  one finger-width towards the center of the body. Continue to use the  inch (2 cm) overlapping circles to feel your breast as you move slowly up toward the collar bone (clavicle) near the base of the neck. Continue the up and down exam using all 3 pressures until you reach the middle of the chest. Do this with each breast, carefully feeling for lumps or changes.   Keep a written record with breast changes or normal  findings for each breast. By writing this information down, you do not need to depend only on memory for size, tenderness, or location. Write down where you are in your menstrual cycle, if you are still menstruating. Breast tissue can have some lumps or thick tissue. However, see your caregiver if you find anything that concerns you.  SEEK MEDICAL CARE IF:  You see a change in shape, contour, or size of your breasts or nipples.   You see skin changes, such as reddened or scaly areas on the breasts or nipples.   You have an unusual discharge from your nipples.   You feel a new lump or unusually thick areas.    This information is not intended to replace advice given to you by your health care provider. Make sure you discuss any questions you have with your health care provider.   Document Released: 07/05/2005 Document Revised: 06/21/2012 Document Reviewed: 10/20/2011 Elsevier Interactive Patient Education 2016 [employer]. Tdap Vaccine (Tetanus, Diphtheria and Pertussis): What You Need to Know 1. Why get vaccinated? Tetanus, diphtheria and pertussis are very serious diseases. Tdap vaccine can protect [place] from these diseases. And, Tdap vaccine given to pregnant women can protect newborn babies against pertussis. TETANUS (Lockjaw) is rare in the [place] today. It causes painful muscle tightening and stiffness, usually all over the body.  It can lead to tightening of muscles in the head and neck so you can't open your mouth, swallow, or sometimes even breathe. Tetanus kills about 1 out of 10 people who are infected even after receiving the best medical care. DIPHTHERIA is also rare in the [place] today. It can cause a thick coating to form in the back of the throat.  It can lead to breathing problems, heart failure, paralysis, and death. PERTUSSIS (Whooping Cough) causes severe coughing spells, which can cause difficulty breathing, vomiting and disturbed sleep.  It can also  lead to weight loss, incontinence, and rib fractures. Up to 2 in 100 adolescents and 5 in 100 adults with pertussis are hospitalized or have complications, which could include pneumonia or death. These diseases are caused by bacteria. Diphtheria and pertussis are spread from person to person through secretions from coughing or sneezing. Tetanus enters the body through cuts, scratches, or wounds. Before vaccines, as many as 200,000 cases of diphtheria, 200,000 cases of pertussis, and hundreds of cases of tetanus, were reported in the [place] each year. Since vaccination began, reports of cases for tetanus and diphtheria have dropped by about 99% and for pertussis by about 80%. 2. Tdap vaccine Tdap vaccine can protect adolescents and adults from tetanus, diphtheria, and pertussis. One dose of Tdap is routinely given at age 20 or 1. People who did not get Tdap at that age should get it as soon as possible. Tdap is especially important for healthcare professionals and anyone having close contact with a baby younger than 12 months. Pregnant women should get a dose of Tdap during every pregnancy, to protect the newborn from pertussis. Infants are most at  risk for severe, life-threatening complications from pertussis. Another vaccine, called Td, protects against tetanus and diphtheria, but not pertussis. A Td booster should be given every 10 years. Tdap may be given as one of these boosters if you have never gotten Tdap before. Tdap may also be given after a severe cut or burn to prevent tetanus infection. Your doctor or the person giving you the vaccine can give you more information. Tdap may safely be given at the same time as other vaccines. 3. Some people should not get this vaccine  A person who has ever had a life-threatening allergic reaction after a previous dose of any diphtheria, tetanus or pertussis containing vaccine, OR has a severe allergy to any part of this vaccine, should not get Tdap  vaccine. Tell the person giving the vaccine about any severe allergies.  Anyone who had coma or long repeated seizures within 7 days after a childhood dose of DTP or DTaP, or a previous dose of Tdap, should not get Tdap, unless a cause other than the vaccine was found. They can still get Td.  Talk to your doctor if you:  have seizures or another nervous system problem,  had severe pain or swelling after any vaccine containing diphtheria, tetanus or pertussis,  ever had a condition called Guillain-Barr Syndrome (GBS),  aren't feeling well on the day the shot is scheduled. 4. Risks With any medicine, including vaccines, there is a chance of side effects. These are usually mild and go away on their own. Serious reactions are also possible but are rare. Most people who get Tdap vaccine do not have any problems with it. Mild problems following Tdap (Did not interfere with activities)  Pain where the shot was given (about 3 in 4 adolescents or 2 in 3 adults)  Redness or swelling where the shot was given (about 1 person in 5)  Mild fever of at least 100.71F (up to about 1 in 25 adolescents or 1 in 100 adults)  Headache (about 3 or 4 people in 10)  Tiredness (about 1 person in 3 or 4)  Nausea, vomiting, diarrhea, stomach ache (up to 1 in 4 adolescents or 1 in 10 adults)  Chills, sore joints (about 1 person in 10)  Body aches (about 1 person in 3 or 4)  Rash, swollen glands (uncommon) Moderate problems following Tdap (Interfered with activities, but did not require medical attention)  Pain where the shot was given (up to 1 in 5 or 6)  Redness or swelling where the shot was given (up to about 1 in 16 adolescents or 1 in 12 adults)  Fever over 102F (about 1 in 100 adolescents or 1 in 250 adults)  Headache (about 1 in 7 adolescents or 1 in 10 adults)  Nausea, vomiting, diarrhea, stomach ache (up to 1 or 3 people in 100)  Swelling of the entire arm where the shot was given (up  to about 1 in 500). Severe problems following Tdap (Unable to perform usual activities; required medical attention)  Swelling, severe pain, bleeding and redness in the arm where the shot was given (rare). Problems that could happen after any vaccine:  People sometimes faint after a medical procedure, including vaccination. Sitting or lying down for about 15 minutes can help prevent fainting, and injuries caused by a fall. Tell your doctor if you feel dizzy, or have vision changes or ringing in the ears.  Some people get severe pain in the shoulder and have difficulty moving the arm where  a shot was given. This happens very rarely.  Any medication can cause a severe allergic reaction. Such reactions from a vaccine are very rare, estimated at fewer than 1 in a million doses, and would happen within a few minutes to a few hours after the vaccination. As with any medicine, there is a very remote chance of a vaccine causing a serious injury or death. The safety of vaccines is always being monitored. For more information, visit: http://www.aguilar.org/ 5. What if there is a serious problem? What should I look for?  Look for anything that concerns you, such as signs of a severe allergic reaction, very high fever, or unusual behavior.  Signs of a severe allergic reaction can include hives, swelling of the face and throat, difficulty breathing, a fast heartbeat, dizziness, and weakness. These would usually start a few minutes to a few hours after the vaccination. What should I do?  If you think it is a severe allergic reaction or other emergency that can't wait, call 9-1-1 or get the person to the nearest hospital. Otherwise, call your doctor.  Afterward, the reaction should be reported to the Vaccine Adverse Event Reporting System (VAERS). Your doctor might file this report, or you can do it yourself through the VAERS web site at www.vaers.SamedayNews.es, or by calling 4027215058. VAERS does not  give medical advice.  6. The National Vaccine Injury Compensation Program The [employer] Vaccine Injury Compensation Program (VICP) is a federal program that was created to compensate people who may have been injured by certain vaccines. Persons who believe they may have been injured by a vaccine can learn about the program and about filing a claim by calling 306-093-1163 or visiting the [place] website at GoldCloset.com.ee. There is a time limit to file a claim for compensation. 7. How can I learn more?  Ask your doctor. He or she can give you the vaccine package insert or suggest other sources of information.  Call your local or state health department.  Contact the Centers for Disease Control and Prevention (CDC):  Call 8784777077 (1-800-CDC-INFO) or  Visit CDC's website at http://hunter.com/ CDC Tdap Vaccine VIS (09/11/13)   This information is not intended to replace advice given to you by your health care provider. Make sure you discuss any questions you have with your health care provider.   Document Released: 01/04/2012 Document Revised: 07/26/2014 Document Reviewed: 10/17/2013 Elsevier Interactive Patient Education 2016 [employer]. Tdap Vaccine (Tetanus, Diphtheria and Pertussis): What You Need to Know 1. Why get vaccinated? Tetanus, diphtheria and pertussis are very serious diseases. Tdap vaccine can protect [place] from these diseases. And, Tdap vaccine given to pregnant women can protect newborn babies against pertussis. TETANUS (Lockjaw) is rare in the [place] today. It causes painful muscle tightening and stiffness, usually all over the body.  It can lead to tightening of muscles in the head and neck so you can't open your mouth, swallow, or sometimes even breathe. Tetanus kills about 1 out of 10 people who are infected even after receiving the best medical care. DIPHTHERIA is also rare in the [place] today. It can cause a thick coating to form in  the back of the throat.  It can lead to breathing problems, heart failure, paralysis, and death. PERTUSSIS (Whooping Cough) causes severe coughing spells, which can cause difficulty breathing, vomiting and disturbed sleep.  It can also lead to weight loss, incontinence, and rib fractures. Up to 2 in 100 adolescents and 5 in 100 adults with pertussis are  hospitalized or have complications, which could include pneumonia or death. These diseases are caused by bacteria. Diphtheria and pertussis are spread from person to person through secretions from coughing or sneezing. Tetanus enters the body through cuts, scratches, or wounds. Before vaccines, as many as 200,000 cases of diphtheria, 200,000 cases of pertussis, and hundreds of cases of tetanus, were reported in the [place] each year. Since vaccination began, reports of cases for tetanus and diphtheria have dropped by about 99% and for pertussis by about 80%. 2. Tdap vaccine Tdap vaccine can protect adolescents and adults from tetanus, diphtheria, and pertussis. One dose of Tdap is routinely given at age 60 or 17. People who did not get Tdap at that age should get it as soon as possible. Tdap is especially important for healthcare professionals and anyone having close contact with a baby younger than 12 months. Pregnant women should get a dose of Tdap during every pregnancy, to protect the newborn from pertussis. Infants are most at risk for severe, life-threatening complications from pertussis. Another vaccine, called Td, protects against tetanus and diphtheria, but not pertussis. A Td booster should be given every 10 years. Tdap may be given as one of these boosters if you have never gotten Tdap before. Tdap may also be given after a severe cut or burn to prevent tetanus infection. Your doctor or the person giving you the vaccine can give you more information. Tdap may safely be given at the same time as other vaccines. 3. Some people should not  get this vaccine  A person who has ever had a life-threatening allergic reaction after a previous dose of any diphtheria, tetanus or pertussis containing vaccine, OR has a severe allergy to any part of this vaccine, should not get Tdap vaccine. Tell the person giving the vaccine about any severe allergies.  Anyone who had coma or long repeated seizures within 7 days after a childhood dose of DTP or DTaP, or a previous dose of Tdap, should not get Tdap, unless a cause other than the vaccine was found. They can still get Td.  Talk to your doctor if you:  have seizures or another nervous system problem,  had severe pain or swelling after any vaccine containing diphtheria, tetanus or pertussis,  ever had a condition called Guillain-Barr Syndrome (GBS),  aren't feeling well on the day the shot is scheduled. 4. Risks With any medicine, including vaccines, there is a chance of side effects. These are usually mild and go away on their own. Serious reactions are also possible but are rare. Most people who get Tdap vaccine do not have any problems with it. Mild problems following Tdap (Did not interfere with activities)  Pain where the shot was given (about 3 in 4 adolescents or 2 in 3 adults)  Redness or swelling where the shot was given (about 1 person in 5)  Mild fever of at least 100.68F (up to about 1 in 25 adolescents or 1 in 100 adults)  Headache (about 3 or 4 people in 10)  Tiredness (about 1 person in 3 or 4)  Nausea, vomiting, diarrhea, stomach ache (up to 1 in 4 adolescents or 1 in 10 adults)  Chills, sore joints (about 1 person in 10)  Body aches (about 1 person in 3 or 4)  Rash, swollen glands (uncommon) Moderate problems following Tdap (Interfered with activities, but did not require medical attention)  Pain where the shot was given (up to 1 in 5 or 6)  Redness or  swelling where the shot was given (up to about 1 in 16 adolescents or 1 in 12 adults)  Fever over 102F  (about 1 in 100 adolescents or 1 in 250 adults)  Headache (about 1 in 7 adolescents or 1 in 10 adults)  Nausea, vomiting, diarrhea, stomach ache (up to 1 or 3 people in 100)  Swelling of the entire arm where the shot was given (up to about 1 in 500). Severe problems following Tdap (Unable to perform usual activities; required medical attention)  Swelling, severe pain, bleeding and redness in the arm where the shot was given (rare). Problems that could happen after any vaccine:  People sometimes faint after a medical procedure, including vaccination. Sitting or lying down for about 15 minutes can help prevent fainting, and injuries caused by a fall. Tell your doctor if you feel dizzy, or have vision changes or ringing in the ears.  Some people get severe pain in the shoulder and have difficulty moving the arm where a shot was given. This happens very rarely.  Any medication can cause a severe allergic reaction. Such reactions from a vaccine are very rare, estimated at fewer than 1 in a million doses, and would happen within a few minutes to a few hours after the vaccination. As with any medicine, there is a very remote chance of a vaccine causing a serious injury or death. The safety of vaccines is always being monitored. For more information, visit: http://www.aguilar.org/ 5. What if there is a serious problem? What should I look for?  Look for anything that concerns you, such as signs of a severe allergic reaction, very high fever, or unusual behavior.  Signs of a severe allergic reaction can include hives, swelling of the face and throat, difficulty breathing, a fast heartbeat, dizziness, and weakness. These would usually start a few minutes to a few hours after the vaccination. What should I do?  If you think it is a severe allergic reaction or other emergency that can't wait, call 9-1-1 or get the person to the nearest hospital. Otherwise, call your doctor.  Afterward, the  reaction should be reported to the Vaccine Adverse Event Reporting System (VAERS). Your doctor might file this report, or you can do it yourself through the VAERS web site at www.vaers.SamedayNews.es, or by calling 6301588433. VAERS does not give medical advice.  6. The National Vaccine Injury Compensation Program The [employer] Vaccine Injury Compensation Program (VICP) is a federal program that was created to compensate people who may have been injured by certain vaccines. Persons who believe they may have been injured by a vaccine can learn about the program and about filing a claim by calling 616 730 6135 or visiting the [place] website at GoldCloset.com.ee. There is a time limit to file a claim for compensation. 7. How can I learn more?  Ask your doctor. He or she can give you the vaccine package insert or suggest other sources of information.  Call your local or state health department.  Contact the Centers for Disease Control and Prevention (CDC):  Call 862-560-8782 (1-800-CDC-INFO) or  Visit CDC's website at http://hunter.com/ CDC Tdap Vaccine VIS (09/11/13)   This information is not intended to replace advice given to you by your health care provider. Make sure you discuss any questions you have with your health care provider.   Document Released: 01/04/2012 Document Revised: 07/26/2014 Document Reviewed: 10/17/2013 Elsevier Interactive Patient Education [employer].

## 2015-08-29 NOTE — Progress Notes (Signed)
Patient: Monique Hall, Female    DOB: Jun 14, 1941, 75 y.o.   MRN: [id number] Visit Date: 08/29/2015  Today's Provider: Halina Maidens, MD   Chief Complaint  Patient presents with  . Medicare Wellness  . Hypertension  . Hyperlipidemia   Subjective:    Annual wellness visit Monique Hall is a 75 y.o. female who presents today for her Subsequent Annual Wellness Visit. She feels fairly well. She reports exercising regularly with PTx. She reports she is sleeping well.   ----------------------------------------------------------- Hypertension This is a chronic problem. The current episode started more than 1 year ago. The problem has been waxing and waning (at home generally 140/85) since onset. Condition status: up and down - worse with pain and anxiety. Pertinent negatives include no chest pain, headaches, neck pain, palpitations or shortness of breath. Agents associated with hypertension include NSAIDs. Past treatments include angiotensin blockers and diuretics. The current treatment provides moderate improvement. There are no compliance problems.   Hyperlipidemia This is a chronic problem. The current episode started more than 1 year ago. The problem is controlled. Recent lipid tests were reviewed and are normal. Pertinent negatives include no chest pain or shortness of breath. Current antihyperlipidemic treatment includes statins.  Back pain - patient has chronic back pain for which she sees pain management. She's had physical therapy and several steroid injections. Medications have not been helpful so she generally does not take them. If she needs something for pain she takes an over-the-counter Aleve. She denies any loss of bowel or bladder function, no falls, no leg weakness and no numbness.  Review of Systems  Constitutional: Negative for fever, chills, diaphoresis and unexpected weight change.  HENT: Negative for hearing loss, sore throat, tinnitus and trouble swallowing.   Eyes:  Negative for visual disturbance.  Respiratory: Negative for cough, chest tightness, shortness of breath and wheezing.   Cardiovascular: Negative for chest pain, palpitations and leg swelling.  Gastrointestinal: Negative for abdominal pain, diarrhea and blood in stool.  Genitourinary: Positive for frequency. Negative for dysuria, urgency, hematuria and pelvic pain.  Musculoskeletal: Positive for back pain. Negative for neck pain and neck stiffness.  Skin: Negative for color change and rash.  Neurological: Negative for dizziness, syncope, light-headedness and headaches.  Hematological: Negative for adenopathy. Does not bruise/bleed easily.  Psychiatric/Behavioral: Negative for confusion, sleep disturbance and dysphoric mood. The patient is not nervous/anxious.     Social History   Social History  . Marital Status: Widowed    Spouse Name: N/A  . Number of Children: N/A  . Years of Education: N/A   Occupational History  . Not on file.   Social History Main Topics  . Smoking status: Former Research scientist (life sciences)  . Smokeless tobacco: Not on file  . Alcohol Use: No  . Drug Use: No  . Sexual Activity: Not on file   Other Topics Concern  . Not on file   Social History Narrative    Patient Active Problem List   Diagnosis Date Noted  . Essential hypertension 07/29/2015  . Mixed hyperlipidemia 07/29/2015  . Menopausal symptoms 07/29/2015  . Tobacco use disorder, moderate, in sustained remission 07/29/2015  . Degeneration of intervertebral disc of lumbar region 06/17/2015  . 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 Sacroiliac strain 01/28/2015    Past Surgical History  Procedure Laterality Date  . Abdominal hysterectomy  1985    total for bleeding  . Appendectomy    . Bladder suspension  2004  Her family history includes CAD in her mother; Diabetes in her mother; Parkinson's disease in her father.    Previous Medications   B COMPLEX VITAMINS  TABLET    Take 1 tablet by mouth daily.   BLACK COHOSH 20 MG TABS    Take 1 tablet by mouth daily. Reported on 08/20/2015   CALCIUM CITRATE-VITAMIN D (CITRACAL+D) 315-200 MG-UNIT PER TABLET    Take 1 tablet by mouth daily.   CETIRIZINE (ZYRTEC) 10 MG TABLET    Take 10 mg by mouth daily. Reported on 08/20/2015   ESTRADIOL (ESTRACE VAGINAL) 0.1 MG/GM VAGINAL CREAM    Place 1 Applicatorful vaginally at bedtime. Reported on 08/20/2015   LOSARTAN-HYDROCHLOROTHIAZIDE (HYZAAR) 50-12.5 MG TABLET    TAKE 1 TABLET EVERY DAY   MELOXICAM (MOBIC) 15 MG TABLET    Take 1 tablet by mouth daily as needed. Reported on 08/29/2015   METHOCARBAMOL (ROBAXIN) 500 MG TABLET    Take 1 tablet (500 mg total) by mouth 3 (three) times daily.   OMEGA-3 FATTY ACIDS (FISH OIL) 1200 MG CPDR    Take 1 capsule by mouth 2 (two) times daily.   OMEPRAZOLE (PRILOSEC) 20 MG CAPSULE    Take 20 mg by mouth as needed. Reported on 07/24/2015   SIMVASTATIN (ZOCOR) 40 MG TABLET    Take 40 mg by mouth daily. Reported on 07/24/2015   TRAMADOL (ULTRAM) 50 MG TABLET    Take 1 tablet by mouth 2 (two) times daily as needed. Reported on 08/29/2015    [name], MD as PCP - General (Family Medicine)     Objective:   Vitals: BP 174/92 mmHg  Pulse 88  Ht 5' 4.5" (1.638 m)  Wt 174 lb 3.2 oz (79.017 kg)  BMI 29.45 kg/m2  Physical Exam  Constitutional: She is oriented to person, place, and time. She appears well-developed and well-nourished. No distress.  HENT:  Head: Normocephalic and atraumatic.  Right Ear: Tympanic membrane and ear canal normal.  Left Ear: Tympanic membrane and ear canal normal.  Nose: Right sinus exhibits no maxillary sinus tenderness. Left sinus exhibits no maxillary sinus tenderness.  Mouth/Throat: Uvula is midline and oropharynx is clear and moist.  Eyes: Conjunctivae and EOM are normal. Right eye exhibits no discharge. Left eye exhibits no discharge. No scleral icterus.  Neck: Normal range of motion.  Carotid bruit is not present. No erythema present. No thyromegaly present.  Cardiovascular: Normal rate, regular rhythm, normal heart sounds and normal pulses.   Pulmonary/Chest: Effort normal. No respiratory distress. She has no wheezes. Right breast exhibits no mass, no nipple discharge, no skin change and no tenderness. Left breast exhibits no mass, no nipple discharge, no skin change and no tenderness.  Abdominal: Soft. Bowel sounds are normal. There is no hepatosplenomegaly. There is no tenderness. There is no CVA tenderness.  Lymphadenopathy:    She has no cervical adenopathy.    She has no axillary adenopathy.  Neurological: She is alert and oriented to person, place, and time. She has normal reflexes. No cranial nerve deficit or sensory deficit.  Skin: Skin is warm, dry and intact. No rash noted.  Psychiatric: She has a normal mood and affect. Her speech is normal and behavior is normal. Thought content normal.  Nursing note and vitals reviewed.   Activities of Daily Living In your present state of health, do you have any difficulty performing the following activities: 08/29/2015 01/22/2015  Hearing? N N  Vision? N N  Difficulty  concentrating or making decisions? N N  Walking or climbing stairs? N N  Dressing or bathing? N N  Doing errands, shopping? N N    Fall Risk Assessment Fall Risk  08/29/2015 01/22/2015  Falls in the past year? No No      Depression Screen PHQ 2/9 Scores 08/29/2015 01/22/2015  PHQ - 2 Score 0 0    Cognitive Testing - 6-CIT   Correct? Score   What year is it? yes 0 Yes = 0    No = 4  What month is it? yes 0 Yes = 0    No = 3  Remember:     [address], [place], [place]     What time is it? yes 0 Yes = 0    No = 3  Count backwards from 20 to 1 yes 0 Correct = 0    1 error = 2   More than 1 error = 4  Say the months of the year in reverse. yes 0 Correct = 0    1 error = 2   More than 1 error = 4  What address did I ask you to remember? yes 0  Correct = 0  1 error = 2    2 error = 4    3 error = 6    4 error = 8    All wrong = 10       TOTAL SCORE  0/28   Interpretation:  Normal  Normal (0-7) Abnormal (8-28)   Lab Results  Component Value Date   CHOL 181 06/24/2014   HDL 59 06/24/2014   LDLCALC 92 06/24/2014   TRIG 150 06/24/2014     Medicare Annual Wellness Visit Summary:   Reviewed patient's Family Medical History Reviewed and updated list of patient's medical providers Assessment of cognitive impairment was done Assessed patient's functional ability Established a written schedule for health screening [place] Completed and Reviewed  Exercise Activities and Dietary recommendations Goals    None      Immunization History  Administered Date(s) Administered  . Influenza-Unspecified 04/22/2015  . Pneumococcal Conjugate-13 06/24/2014  . Pneumococcal Polysaccharide-23 07/21/2011  . Tdap 07/20/2005  . Zoster 07/20/2000    Health Maintenance  Topic Date Due  . DEXA SCAN  08/19/2006  . MAMMOGRAM  04/27/2015  . TETANUS/TDAP  07/21/2015  . INFLUENZA VACCINE  02/17/2016  . COLONOSCOPY  07/20/2020  . ZOSTAVAX  Completed  . PNA vac Low Risk Adult  Completed     Discussed health benefits of physical activity, and encouraged her to engage in regular exercise appropriate for her age and condition.    ------------------------------------------------------------------------------------------------------------   Assessment & Plan:    1. Essential hypertension Not well controllled Increase dose of hyzaar - CBC with Differential/Platelet - Comprehensive metabolic panel - TSH - POCT urinalysis dipstick - losartan-hydrochlorothiazide (HYZAAR) 100-12.5 MG tablet; Take 1 tablet by mouth daily.  Dispense: 90 tablet; Refill: 3  2. Mixed hyperlipidemia Continue statin therapy - Lipid panel - simvastatin (ZOCOR) 40 MG tablet; Take 1 tablet (40 mg total) by mouth daily. Reported on 07/24/2015   Dispense: 90 tablet; Refill: 3  3. Tobacco use disorder, moderate, in sustained remission  4. Neuritis 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 Followed by pain management Continue exercises; mobic as needed  5. Encounter for screening mammogram for breast cancer - MM DIGITAL SCREENING BILATERAL; Future  6. Need for diphtheria-tetanus-pertussis (Tdap) vaccine - Tdap  vaccine greater than or equal to 7yo IM   Halina Maidens, MD [facility]  08/29/2015

## 2015-08-30 LAB — CBC WITH DIFFERENTIAL/PLATELET
Basophils Absolute: 0.1 10*3/uL (ref 0.0–0.2)
Basos: 1 %
EOS (ABSOLUTE): 0.3 10*3/uL (ref 0.0–0.4)
Eos: 5 %
HEMATOCRIT: 38.8 % (ref 34.0–46.6)
Hemoglobin: 12.6 g/dL (ref 11.1–15.9)
IMMATURE GRANULOCYTES: 0 %
Immature Grans (Abs): 0 10*3/uL (ref 0.0–0.1)
LYMPHS ABS: 3.1 10*3/uL (ref 0.7–3.1)
Lymphs: 43 %
MCH: 30.5 pg (ref 26.6–33.0)
MCHC: 32.5 g/dL (ref 31.5–35.7)
MCV: 94 fL (ref 79–97)
Monocytes Absolute: 0.5 10*3/uL (ref 0.1–0.9)
Monocytes: 8 %
NEUTROS PCT: 43 %
Neutrophils Absolute: 3 10*3/uL (ref 1.4–7.0)
PLATELETS: 395 10*3/uL — AB (ref 150–379)
RBC: 4.13 x10E6/uL (ref 3.77–5.28)
RDW: 14.3 % (ref 12.3–15.4)
WBC: 7 10*3/uL (ref 3.4–10.8)

## 2015-08-30 LAB — COMPREHENSIVE METABOLIC PANEL
A/G RATIO: 1.7 (ref 1.1–2.5)
ALK PHOS: 41 IU/L (ref 39–117)
ALT: 27 IU/L (ref 0–32)
AST: 24 IU/L (ref 0–40)
Albumin: 4.5 g/dL (ref 3.5–4.8)
BUN/Creatinine Ratio: 13 (ref 11–26)
BUN: 11 mg/dL (ref 8–27)
Bilirubin Total: 0.9 mg/dL (ref 0.0–1.2)
CALCIUM: 10 mg/dL (ref 8.7–10.3)
CO2: 25 mmol/L (ref 18–29)
Chloride: 101 mmol/L (ref 96–106)
Creatinine, Ser: 0.87 mg/dL (ref 0.57–1.00)
GFR calc Af Amer: 76 mL/min/{1.73_m2} (ref >59)
GFR calc non Af Amer: 66 mL/min/{1.73_m2} (ref >59)
GLOBULIN, TOTAL: 2.7 g/dL (ref 1.5–4.5)
Glucose: 100 mg/dL — ABNORMAL HIGH (ref 65–99)
Potassium: 4.9 mmol/L (ref 3.5–5.2)
Sodium: 143 mmol/L (ref 134–144)
Total Protein: 7.2 g/dL (ref 6.0–8.5)

## 2015-08-30 LAB — LIPID PANEL
CHOL/HDL RATIO: 2.9 ratio (ref 0.0–4.4)
Cholesterol, Total: 182 mg/dL (ref 100–199)
HDL: 62 mg/dL (ref >39)
LDL Calculated: 88 mg/dL (ref 0–99)
Triglycerides: 160 mg/dL — ABNORMAL HIGH (ref 0–149)
VLDL Cholesterol Cal: 32 mg/dL (ref 5–40)

## 2015-08-30 LAB — TSH: TSH: 2.56 u[IU]/mL (ref 0.450–4.500)

## 2015-09-01 ENCOUNTER — Telehealth: Payer: Self-pay

## 2015-09-01 NOTE — Telephone Encounter (Signed)
Spoke with patient. Patient advised of all results and verbalized understanding. Will call back with any future questions or concerns. MAH  

## 2015-09-01 NOTE — Telephone Encounter (Signed)
-----   Message from Glean Hess, MD sent at 08/30/2015 11:18 AM EST ----- Labs are normal.  Cholesterol is excellent. Blood glucose and triglycerides are very slightly elevated - limit sweets and fats.  Continue same medications.

## 2015-09-03 ENCOUNTER — Ambulatory Visit
Admission: RE | Admit: 2015-09-03 | Discharge: 2015-09-03 | Disposition: A | Discharge status: Home / Self Care | Payer: Medicare Other | Source: Ambulatory Visit | Attending: Internal Medicine | Admitting: Internal Medicine

## 2015-09-03 ENCOUNTER — Other Ambulatory Visit: Payer: Self-pay | Admitting: Internal Medicine

## 2015-09-03 ENCOUNTER — Encounter: Payer: Medicare Other | Admitting: Physical Therapy

## 2015-09-03 DIAGNOSIS — Z1231 Encounter for screening mammogram for malignant neoplasm of breast: Secondary | ICD-10-CM | POA: Insufficient documentation

## 2015-09-09 DIAGNOSIS — N952 Postmenopausal atrophic vaginitis: Secondary | ICD-10-CM | POA: Diagnosis not present

## 2015-09-09 DIAGNOSIS — R102 Pelvic and perineal pain: Secondary | ICD-10-CM | POA: Diagnosis not present

## 2015-10-01 DIAGNOSIS — H2513 Age-related nuclear cataract, bilateral: Secondary | ICD-10-CM | POA: Diagnosis not present

## 2016-02-26 ENCOUNTER — Encounter: Payer: Self-pay | Admitting: Internal Medicine

## 2016-02-26 ENCOUNTER — Ambulatory Visit (INDEPENDENT_AMBULATORY_CARE_PROVIDER_SITE_OTHER): Payer: Medicare Other | Admitting: Internal Medicine

## 2016-02-26 VITALS — BP 130/78 | HR 86 | Ht 65.0 in | Wt 172.0 lb

## 2016-02-26 DIAGNOSIS — E782 Mixed hyperlipidemia: Secondary | ICD-10-CM

## 2016-02-26 DIAGNOSIS — I10 Essential (primary) hypertension: Secondary | ICD-10-CM

## 2016-02-26 NOTE — Progress Notes (Signed)
Date:  02/26/2016   Name:  Monique Hall   DOB:  December 25, 1940   MRN:  [id number]   Chief Complaint: Hypertension (B/P has been in 120s/60-70) and Hyperlipidemia (Stopped Simv x 3 weeks ago due to back pain- has since improved)  Hypertension  This is a chronic problem. The current episode started more than 1 year ago. The problem has been gradually improving since onset. The problem is controlled. Pertinent negatives include no chest pain, headaches, palpitations or shortness of breath. Past treatments include angiotensin blockers and diuretics. The current treatment provides significant improvement.  Hyperlipidemia  This is a chronic problem. Recent lipid tests were reviewed and are high (elevated triglycerides). There are no known factors aggravating her hyperlipidemia. Associated symptoms include myalgias (from statins). Pertinent negatives include no chest pain, focal sensory loss or shortness of breath. Risk factors for coronary artery disease include dyslipidemia and hypertension.  Stopped simvastatin due to low back pain which is much better and now relieved as needed by tylenol. She had similar side effects to atorvastatin and crestor.  Review of Systems  Constitutional: Negative for fatigue and unexpected weight change.  Respiratory: Negative for cough, chest tightness, shortness of breath and wheezing.   Cardiovascular: Negative for chest pain, palpitations and leg swelling.  Gastrointestinal: Negative for abdominal pain and diarrhea.  Musculoskeletal: Positive for myalgias (from statins).  Neurological: Positive for light-headedness (occasionally when standing too quickly). Negative for dizziness, syncope and headaches.    Patient Active Problem List   Diagnosis Date Noted  . Essential hypertension 07/29/2015  . Mixed hyperlipidemia 07/29/2015  . Menopausal symptoms 07/29/2015  . Tobacco use disorder, moderate, in sustained remission 07/29/2015  . Degeneration of intervertebral  disc of lumbar region 06/17/2015  . 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 Sacroiliac strain 01/28/2015    Prior to Admission medications   Medication Sig Start Date End Date Taking? Authorizing Provider  b complex vitamins tablet Take 1 tablet by mouth daily.   Yes Historical Provider, MD  Black Cohosh 200 MG CAPS Take 1 tablet by mouth daily. Reported on 08/20/2015   Yes Historical Provider, MD  calcium citrate-vitamin D (CITRACAL+D) 315-200 MG-UNIT per tablet Take 1 tablet by mouth daily.   Yes Historical Provider, MD  cetirizine (ZYRTEC) 10 MG tablet Take 10 mg by mouth daily. Reported on 08/20/2015   Yes Historical Provider, MD  estradiol (ESTRACE VAGINAL) 0.1 MG/GM vaginal cream Place 1 Applicatorful vaginally at bedtime. Reported on 08/20/2015   Yes Historical Provider, MD  losartan-hydrochlorothiazide (HYZAAR) 100-12.5 MG tablet Take 1 tablet by mouth daily. 08/29/15  Yes Glean Hess, MD  Omega-3 Fatty Acids (FISH OIL) 1200 MG CPDR Take 1 capsule by mouth 2 (two) times daily.   Yes Historical Provider, MD  omeprazole (PRILOSEC) 20 MG capsule Take 20 mg by mouth as needed. Reported on 07/24/2015   Yes Historical Provider, MD  meloxicam (MOBIC) 15 MG tablet Take 1 tablet by mouth daily as needed. Reported on 08/29/2015 11/21/14   Historical Provider, MD  simvastatin (ZOCOR) 40 MG tablet Take 1 tablet (40 mg total) by mouth daily. Reported on 07/24/2015 Patient not taking: Reported on 02/26/2016 08/29/15   Glean Hess, MD    Allergies  Allergen Reactions  . Baclofen     Other reaction(s): Unknown  . Etodolac Other (See Comments)    Pt got indigestion and nightmares;     Past Surgical History:  Procedure Laterality Date  .  ABDOMINAL HYSTERECTOMY  1985   total for bleeding  . APPENDECTOMY    . BLADDER SUSPENSION  2004    Social History  Substance Use Topics  . Smoking status: Former Research scientist (life sciences)  . Smokeless tobacco: Not on file  . Alcohol  use No     Medication list has been reviewed and updated.   Physical Exam  Constitutional: She is oriented to person, place, and time. She appears well-developed. No distress.  HENT:  Head: Normocephalic and atraumatic.  Neck: Normal range of motion. Carotid bruit is not present. No thyromegaly present.  Cardiovascular: Normal rate, regular rhythm and normal heart sounds.   Pulmonary/Chest: Effort normal and breath sounds normal. No respiratory distress.  Musculoskeletal: She exhibits no edema or tenderness.  Neurological: She is alert and oriented to person, place, and time.  Skin: Skin is warm and dry. No rash noted.  Psychiatric: She has a normal mood and affect. Her behavior is normal. Thought content normal.  Nursing note and vitals reviewed.   BP 130/78   Pulse 86   Ht 5\' 5"  (1.651 m)   Wt 172 lb (78 kg)   BMI 28.62 kg/m   Assessment and Plan: 1. Essential hypertension controlled  2. Mixed hyperlipidemia Low fat diet - reassess next visit   Halina Maidens, MD [facility]  02/26/2016

## 2016-02-26 NOTE — Patient Instructions (Signed)

## 2016-04-10 ENCOUNTER — Encounter: Payer: Self-pay | Admitting: Emergency Medicine

## 2016-04-10 ENCOUNTER — Ambulatory Visit
Admission: EM | Admit: 2016-04-10 | Discharge: 2016-04-10 | Disposition: A | Discharge status: Home / Self Care | Payer: Medicare Other | Attending: Family Medicine | Admitting: Family Medicine

## 2016-04-10 DIAGNOSIS — J069 Acute upper respiratory infection, unspecified: Secondary | ICD-10-CM

## 2016-04-10 DIAGNOSIS — B9789 Other viral agents as the cause of diseases classified elsewhere: Principal | ICD-10-CM

## 2016-04-10 MED ORDER — GUAIFENESIN-CODEINE 100-10 MG/5ML PO SOLN: ORAL | 0 refills | Status: DC

## 2016-04-10 NOTE — ED Provider Notes (Signed)
[facility]    CSN: [id number] Arrival date & time: 04/10/16  1031  First Provider Contact:  None       History   Chief Complaint Chief Complaint  Patient presents with  . Cough    HPI Monique Hall is a 75 y.o. female.   The history is provided by the patient.  Cough  Associated symptoms: fever   Associated symptoms: no wheezing   URI  Presenting symptoms: congestion, cough and fever   Severity:  Moderate Onset quality:  Sudden Duration:  5 days Timing:  Constant Progression:  Worsening Chronicity:  New Relieved by:  Nothing Ineffective treatments:  OTC medications Associated symptoms: sinus pain   Associated symptoms: no wheezing   Risk factors: sick contacts   Risk factors: not elderly, no chronic cardiac disease, no chronic kidney disease, no chronic respiratory disease, no diabetes mellitus, no immunosuppression, no recent illness and no recent travel     Past Medical History:  Diagnosis Date  . Esophagitis   . Gastritis   . Hypercholesteremia   . Hypertension   . Sciatica   . Tendonitis, Achilles 2016   both     Patient Active Problem List   Diagnosis Date Noted  . Essential hypertension 07/29/2015  . Mixed hyperlipidemia 07/29/2015  . Menopausal symptoms 07/29/2015  . Tobacco use disorder, moderate, in sustained remission 07/29/2015  . Degeneration of intervertebral disc of lumbar region 06/17/2015  . 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 Sacroiliac strain 01/28/2015    Past Surgical History:  Procedure Laterality Date  . ABDOMINAL HYSTERECTOMY  1985   total for bleeding  . APPENDECTOMY    . BLADDER SUSPENSION  2004    OB History    No data available       Home Medications    Prior to Admission medications   Medication Sig Start Date End Date Taking? Authorizing Provider  b complex vitamins tablet Take 1 tablet by mouth daily.    Historical Provider, MD  Black Cohosh 200  MG CAPS Take 1 tablet by mouth daily. Reported on 08/20/2015    Historical Provider, MD  calcium citrate-vitamin D (CITRACAL+D) 315-200 MG-UNIT per tablet Take 1 tablet by mouth daily.    Historical Provider, MD  cetirizine (ZYRTEC) 10 MG tablet Take 10 mg by mouth daily. Reported on 08/20/2015    Historical Provider, MD  estradiol (ESTRACE VAGINAL) 0.1 MG/GM vaginal cream Place 1 Applicatorful vaginally at bedtime. Reported on 08/20/2015    Historical Provider, MD  guaiFENesin-codeine 100-10 MG/5ML syrup 10 ml po qhs prn 04/10/16   Norval Gable, MD  losartan-hydrochlorothiazide (HYZAAR) 100-12.5 MG tablet Take 1 tablet by mouth daily. 08/29/15   Glean Hess, MD  meloxicam (MOBIC) 15 MG tablet Take 1 tablet by mouth daily as needed. Reported on 08/29/2015 11/21/14   Historical Provider, MD  Omega-3 Fatty Acids (FISH OIL) 1200 MG CPDR Take 1 capsule by mouth 2 (two) times daily.    Historical Provider, MD  omeprazole (PRILOSEC) 20 MG capsule Take 20 mg by mouth as needed. Reported on 07/24/2015    Historical Provider, MD    Family History Family History  Problem Relation Age of Onset  . CAD Mother   . Diabetes Mother   . Parkinson's disease Father   . Breast cancer Neg Hx     Social History Social History  Substance Use Topics  . Smoking status: Former Research scientist (life sciences)  . Smokeless tobacco: Never  Used  . Alcohol use No     Allergies   Baclofen and Etodolac   Review of Systems Review of Systems  Constitutional: Positive for fever.  HENT: Positive for congestion.   Respiratory: Positive for cough. Negative for wheezing.      Physical Exam Triage Vital Signs ED Triage Vitals  Enc Vitals Group     BP 04/10/16 1043 (!) 141/65     Pulse Rate 04/10/16 1043 79     Resp 04/10/16 1043 16     Temp 04/10/16 1043 97 F (36.1 C)     Temp Source 04/10/16 1043 Tympanic     SpO2 04/10/16 1043 99 %     Weight 04/10/16 1043 172 lb (78 kg)     Height 04/10/16 1043 5' 5.5" (1.664 m)     Head  Circumference --      Peak Flow --      Pain Score 04/10/16 1045 0     Pain Loc --      Pain Edu? --      Excl. in [place]? --    No data found.   Updated Vital Signs BP (!) 141/65 (BP Location: Left Arm)   Pulse 79   Temp 97 F (36.1 C) (Tympanic)   Resp 16   Ht 5' 5.5" (1.664 m)   Wt 172 lb (78 kg)   SpO2 99%   BMI 28.19 kg/m   Visual Acuity Right Eye Distance:   Left Eye Distance:   Bilateral Distance:    Right Eye Near:   Left Eye Near:    Bilateral Near:     Physical Exam  Constitutional: She appears well-developed and well-nourished. No distress.  HENT:  Head: Normocephalic and atraumatic.  Right Ear: Tympanic membrane, external ear and ear canal normal.  Left Ear: Tympanic membrane, external ear and ear canal normal.  Nose: Mucosal edema and rhinorrhea present. No nose lacerations, sinus tenderness, nasal deformity, septal deviation or nasal septal hematoma. No epistaxis.  No foreign bodies. Right sinus exhibits no maxillary sinus tenderness and no frontal sinus tenderness. Left sinus exhibits no maxillary sinus tenderness and no frontal sinus tenderness.  Mouth/Throat: Uvula is midline, oropharynx is clear and moist and mucous membranes are normal. No oropharyngeal exudate.  Eyes: Conjunctivae and EOM are normal. Pupils are equal, round, and reactive to light. Right eye exhibits no discharge. Left eye exhibits no discharge. No scleral icterus.  Neck: Normal range of motion. Neck supple. No thyromegaly present.  Cardiovascular: Normal rate, regular rhythm and normal heart sounds.   Pulmonary/Chest: Effort normal and breath sounds normal. No respiratory distress. She has no wheezes. She has no rales.  Lymphadenopathy:    She has no cervical adenopathy.  Skin: She is not diaphoretic.  Nursing note and vitals reviewed.    UC Treatments / Results  Labs (all labs ordered are listed, but only abnormal results are displayed) Labs Reviewed - No data to display  EKG   EKG Interpretation None       Radiology No results found.  Procedures Procedures (including critical care time)  Medications Ordered in UC Medications - No data to display   Initial Impression / Assessment and Plan / UC Course  I have reviewed the triage vital signs and the nursing notes.  Pertinent labs & imaging results that were available during my care of the patient were reviewed by me and considered in my medical decision making (see chart for details).  Clinical Course  Final Clinical Impressions(s) / UC Diagnoses   Final diagnoses:  Viral URI with cough    New Prescriptions Discharge Medication List as of 04/10/2016 11:09 AM    START taking these medications   Details  guaiFENesin-codeine 100-10 MG/5ML syrup 10 ml po qhs prn, Print       1. diagnosis reviewed with patient 2. rx as per orders above; reviewed possible side effects, interactions, risks and benefits  3. Recommend supportive treatment with increased fluids 4. Follow-up prn if symptoms worsen or don't improve   Norval Gable, MD 04/10/16 1411

## 2016-04-10 NOTE — ED Triage Notes (Signed)
Patient c/o cough, chest congestion, and chills that started on Tuesday.   

## 2016-04-13 ENCOUNTER — Telehealth: Payer: Self-pay

## 2016-04-13 NOTE — Telephone Encounter (Signed)
Courtesy call back completed today after patient's visit at [facility]. Patient improved and will call back with any questions or concerns.  

## 2016-05-08 ENCOUNTER — Encounter: Payer: Self-pay | Admitting: Internal Medicine

## 2016-05-13 DIAGNOSIS — Z23 Encounter for immunization: Secondary | ICD-10-CM | POA: Diagnosis not present

## 2016-08-04 ENCOUNTER — Other Ambulatory Visit: Payer: Self-pay | Admitting: Internal Medicine

## 2016-08-04 DIAGNOSIS — I10 Essential (primary) hypertension: Secondary | ICD-10-CM

## 2016-08-10 ENCOUNTER — Other Ambulatory Visit: Payer: Self-pay | Admitting: Internal Medicine

## 2016-08-10 DIAGNOSIS — Z1231 Encounter for screening mammogram for malignant neoplasm of breast: Secondary | ICD-10-CM

## 2016-09-02 ENCOUNTER — Encounter: Payer: Self-pay | Admitting: Internal Medicine

## 2016-09-02 ENCOUNTER — Ambulatory Visit (INDEPENDENT_AMBULATORY_CARE_PROVIDER_SITE_OTHER): Payer: Medicare Other | Admitting: Internal Medicine

## 2016-09-02 VITALS — BP 118/69 | HR 86 | Temp 97.9°F | Ht 64.0 in | Wt 165.0 lb

## 2016-09-02 DIAGNOSIS — E782 Mixed hyperlipidemia: Secondary | ICD-10-CM | POA: Diagnosis not present

## 2016-09-02 DIAGNOSIS — K219 Gastro-esophageal reflux disease without esophagitis: Secondary | ICD-10-CM | POA: Diagnosis not present

## 2016-09-02 DIAGNOSIS — I10 Essential (primary) hypertension: Secondary | ICD-10-CM | POA: Diagnosis not present

## 2016-09-02 DIAGNOSIS — Z0000 Encounter for general adult medical examination without abnormal findings: Secondary | ICD-10-CM | POA: Diagnosis not present

## 2016-09-02 DIAGNOSIS — Z1231 Encounter for screening mammogram for malignant neoplasm of breast: Secondary | ICD-10-CM

## 2016-09-02 LAB — POCT URINALYSIS DIPSTICK
BILIRUBIN UA: NEGATIVE
Glucose, UA: NEGATIVE
KETONES UA: NEGATIVE
LEUKOCYTES UA: NEGATIVE
Nitrite, UA: NEGATIVE
SPEC GRAV UA: 1.015
Urobilinogen, UA: 0.2
pH, UA: 5

## 2016-09-02 NOTE — Progress Notes (Signed)
Patient: Monique Hall, Female    DOB: 07-23-1940, 76 y.o.   MRN: [id number] Visit Date: 09/02/2016  Today's Provider: Halina Maidens, MD   Chief Complaint  Patient presents with  . Annual Exam  . Hypertension  . Gastroesophageal Reflux   Subjective:    Annual wellness visit Monique Hall is a 76 y.o. female who presents today for her Subsequent Annual Wellness Visit. She feels well. She reports exercising some but stays busy babysitting her greatgrandson. She reports she is sleeping well. She denies breast complaints.  Mammogram is scheduled for later this month.  ----------------------------------------------------------- Hypertension  This is a chronic problem. The current episode started more than 1 year ago. The problem is unchanged. The problem is controlled. Pertinent negatives include no chest pain, headaches, palpitations or shortness of breath.  Gastroesophageal Reflux  She reports no abdominal pain, no chest pain, no coughing or no wheezing. This is a recurrent problem. The problem occurs rarely. Pertinent negatives include no fatigue. She has tried a PPI for the symptoms.    Review of Systems  Constitutional: Negative for chills, fatigue and fever.  HENT: Negative for congestion, hearing loss, tinnitus, trouble swallowing and voice change.   Eyes: Negative for visual disturbance.  Respiratory: Negative for cough, chest tightness, shortness of breath and wheezing.   Cardiovascular: Negative for chest pain, palpitations and leg swelling.  Gastrointestinal: Negative for abdominal pain, constipation, diarrhea and vomiting.  Endocrine: Negative for polydipsia and polyuria.  Genitourinary: Negative for dysuria, frequency, genital sores, vaginal bleeding and vaginal discharge.  Musculoskeletal: Positive for back pain. Negative for arthralgias, gait problem and joint swelling.  Skin: Negative for color change and rash.  Neurological: Negative for dizziness, tremors,  light-headedness and headaches.  Hematological: Negative for adenopathy. Does not bruise/bleed easily.  Psychiatric/Behavioral: Negative for dysphoric mood and sleep disturbance. The patient is not nervous/anxious.     Social History   Social History  . Marital status: Widowed    Spouse name: N/A  . Number of children: N/A  . Years of education: N/A   Occupational History  . Not on file.   Social History Main Topics  . Smoking status: Former Research scientist (life sciences)  . Smokeless tobacco: Never Used  . Alcohol use No  . Drug use: No  . Sexual activity: Not on file   Other Topics Concern  . Not on file   Social History Narrative  . No narrative on file    Patient Active Problem List   Diagnosis Date Noted  . Gastroesophageal reflux disease without esophagitis 09/02/2016  . Essential hypertension 07/29/2015  . Mixed hyperlipidemia 07/29/2015  . Menopausal symptoms 07/29/2015  . Tobacco use disorder, moderate, in sustained remission 07/29/2015  . Degeneration of intervertebral disc of lumbar region 06/17/2015  . 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 Sacroiliac strain 01/28/2015    Past Surgical History:  Procedure Laterality Date  . ABDOMINAL HYSTERECTOMY  1985   total for bleeding  . APPENDECTOMY    . BLADDER SUSPENSION  2004    Her family history includes CAD in her mother; Diabetes in her mother; Parkinson's disease in her father.     Previous Medications   BLACK COHOSH 200 MG CAPS    Take 1 tablet by mouth daily. Reported on 08/20/2015   CETIRIZINE (ZYRTEC) 10 MG TABLET    Take 10 mg by mouth daily. Reported on 08/20/2015   COENZYME Q10-VITAMIN E (QUNOL ULTRA COQ10) 100-150 MG-UNIT CAPS  Take 100 mg by mouth every morning.   ESTRADIOL (ESTRACE VAGINAL) 0.1 MG/GM VAGINAL CREAM    Place 1 Applicatorful vaginally at bedtime. Reported on 08/20/2015   LOSARTAN-HYDROCHLOROTHIAZIDE (HYZAAR) 100-12.5 MG TABLET    TAKE 1 TABLET EVERY DAY    MULTIPLE VITAMIN (MULTIVITAMIN) TABLET    Take 1 tablet by mouth daily.   OMEGA-3 FATTY ACIDS (FISH OIL) 1200 MG CPDR    Take 1 capsule by mouth 2 (two) times daily.   OMEPRAZOLE (PRILOSEC) 20 MG CAPSULE    Take 20 mg by mouth as needed. Reported on 07/24/2015    [name], MD as PCP - General (Family Medicine)      Objective:   Vitals: BP (!) 148/82 Comment: pt did not take BP meds this morning  Pulse 86   Temp 97.9 F (36.6 C)   Ht 5\' 4"  (1.626 m)   Wt 165 lb (74.8 kg)   SpO2 95%   BMI 28.32 kg/m   Physical Exam  Constitutional: She is oriented to person, place, and time. She appears well-developed and well-nourished. No distress.  HENT:  Head: Normocephalic and atraumatic.  Left Ear: Tympanic membrane and ear canal normal.  Nose: Right sinus exhibits no maxillary sinus tenderness. Left sinus exhibits no maxillary sinus tenderness.  Mouth/Throat: Uvula is midline and oropharynx is clear and moist.  Excessive cerumen right ear canal  Eyes: Conjunctivae and EOM are normal. Right eye exhibits no discharge. Left eye exhibits no discharge. No scleral icterus.  Neck: Normal range of motion. Neck supple. Carotid bruit is not present. No erythema present. No thyromegaly present.  Cardiovascular: Normal rate, regular rhythm, normal heart sounds and normal pulses.   Pulmonary/Chest: Effort normal and breath sounds normal. No respiratory distress. She has no wheezes. Right breast exhibits no mass, no nipple discharge, no skin change and no tenderness. Left breast exhibits no mass, no nipple discharge, no skin change and no tenderness.  Abdominal: Soft. Bowel sounds are normal. There is no hepatosplenomegaly. There is no tenderness. There is no CVA tenderness.  Musculoskeletal: Normal range of motion. She exhibits no edema or tenderness.  Lymphadenopathy:    She has no cervical adenopathy.    She has no axillary adenopathy.  Neurological: She is alert and oriented  to person, place, and time. She has normal reflexes. No cranial nerve deficit or sensory deficit.  Skin: Skin is warm, dry and intact. No rash noted.  Psychiatric: She has a normal mood and affect. Her speech is normal and behavior is normal. Thought content normal.  Nursing note and vitals reviewed.   Activities of Daily Living In your present state of health, do you have any difficulty performing the following activities: 09/02/2016  Hearing? N  Vision? N  Difficulty concentrating or making decisions? N  Walking or climbing stairs? N  Dressing or bathing? N  Doing errands, shopping? N  Preparing Food and eating ? N  Using the Toilet? N  In the past six months, have you accidently leaked urine? N  Do you have problems with loss of bowel control? N  Managing your Medications? N  Managing your Finances? N  Housekeeping or managing your Housekeeping? N  Some recent data might be hidden    Fall Risk Assessment Fall Risk  09/02/2016 08/29/2015 01/22/2015  Falls in the past year? No No No      Depression Screen PHQ 2/9 Scores 09/02/2016 08/29/2015 01/22/2015  PHQ - 2 Score 0 0 0  6CIT Screen 09/02/2016  What Year? 0 points  What month? 0 points  What time? 0 points  Count back from 20 0 points  Months in reverse 0 points  Repeat phrase 0 points  Total Score 0    Medicare Annual Wellness Visit Summary:  Reviewed patient's Family Medical History Reviewed and updated list of patient's medical providers Assessment of cognitive impairment was done Assessed patient's functional ability Established a written schedule for health screening [place] Completed and Reviewed  Exercise Activities and Dietary recommendations Goals    None      Immunization History  Administered Date(s) Administered  . Influenza-Unspecified 04/22/2015  . Pneumococcal Conjugate-13 06/24/2014  . Pneumococcal Polysaccharide-23 07/21/2011  . Tdap 07/20/2005, 08/29/2015  . Zoster  07/20/2000    Health Maintenance  Topic Date Due  . MAMMOGRAM  09/02/2016  . COLONOSCOPY  07/20/2020  . TETANUS/TDAP  08/28/2025  . INFLUENZA VACCINE  Addressed  . DEXA SCAN  Addressed  . ZOSTAVAX  Completed  . PNA vac Low Risk Adult  Completed    Discussed health benefits of physical activity, and encouraged her to engage in regular exercise appropriate for her age and condition.    ------------------------------------------------------------------------------------------------------------  Assessment & Plan:     1. Medicare annual wellness visit, subsequent Measures satisfied - POCT urinalysis dipstick  2. Essential hypertension controlled - Comprehensive metabolic panel - TSH  3. Mixed hyperlipidemia Continue supplements Remain off statin due to severe back pain/myalgia - Lipid panel  4. Gastroesophageal reflux disease without esophagitis Controlled on PRN PPI - CBC with Differential/Platelet  5. Encounter for screening mammogram for breast cancer scheduled    Halina Maidens, MD [facility]  09/02/2016

## 2016-09-02 NOTE — Patient Instructions (Signed)
Schedule Skin Survey with Dermatologist  Health Maintenance  Topic Date Due  . MAMMOGRAM  09/02/2016  . COLONOSCOPY  07/20/2020  . TETANUS/TDAP  08/28/2025  . INFLUENZA VACCINE  Addressed  . DEXA SCAN  Addressed  . ZOSTAVAX  Completed  . PNA vac Low Risk Adult  Completed

## 2016-09-03 LAB — CBC WITH DIFFERENTIAL/PLATELET
Basophils Absolute: 0.1 10*3/uL (ref 0.0–0.2)
Basos: 1 %
EOS (ABSOLUTE): 0.2 10*3/uL (ref 0.0–0.4)
EOS: 3 %
HEMATOCRIT: 37.2 % (ref 34.0–46.6)
HEMOGLOBIN: 12.2 g/dL (ref 11.1–15.9)
Immature Grans (Abs): 0 10*3/uL (ref 0.0–0.1)
Immature Granulocytes: 0 %
LYMPHS ABS: 3.3 10*3/uL — AB (ref 0.7–3.1)
Lymphs: 47 %
MCH: 30.2 pg (ref 26.6–33.0)
MCHC: 32.8 g/dL (ref 31.5–35.7)
MCV: 92 fL (ref 79–97)
MONOCYTES: 8 %
Monocytes Absolute: 0.6 10*3/uL (ref 0.1–0.9)
NEUTROS ABS: 2.9 10*3/uL (ref 1.4–7.0)
Neutrophils: 41 %
Platelets: 385 10*3/uL — ABNORMAL HIGH (ref 150–379)
RBC: 4.04 x10E6/uL (ref 3.77–5.28)
RDW: 13.9 % (ref 12.3–15.4)
WBC: 7.1 10*3/uL (ref 3.4–10.8)

## 2016-09-03 LAB — COMPREHENSIVE METABOLIC PANEL
A/G RATIO: 1.5 (ref 1.2–2.2)
ALK PHOS: 45 IU/L (ref 39–117)
ALT: 18 IU/L (ref 0–32)
AST: 20 IU/L (ref 0–40)
Albumin: 4.4 g/dL (ref 3.5–4.8)
BUN/Creatinine Ratio: 14 (ref 12–28)
BUN: 14 mg/dL (ref 8–27)
Bilirubin Total: 0.9 mg/dL (ref 0.0–1.2)
CALCIUM: 9.9 mg/dL (ref 8.7–10.3)
CHLORIDE: 100 mmol/L (ref 96–106)
CO2: 22 mmol/L (ref 18–29)
Creatinine, Ser: 0.99 mg/dL (ref 0.57–1.00)
GFR calc Af Amer: 64 mL/min/{1.73_m2} (ref >59)
GFR, EST NON AFRICAN AMERICAN: 56 mL/min/{1.73_m2} — AB (ref >59)
Globulin, Total: 2.9 g/dL (ref 1.5–4.5)
Glucose: 97 mg/dL (ref 65–99)
POTASSIUM: 4.4 mmol/L (ref 3.5–5.2)
Sodium: 141 mmol/L (ref 134–144)
Total Protein: 7.3 g/dL (ref 6.0–8.5)

## 2016-09-03 LAB — LIPID PANEL
CHOL/HDL RATIO: 5.3 ratio — AB (ref 0.0–4.4)
Cholesterol, Total: 293 mg/dL — ABNORMAL HIGH (ref 100–199)
HDL: 55 mg/dL (ref >39)
LDL CALC: 200 mg/dL — AB (ref 0–99)
TRIGLYCERIDES: 192 mg/dL — AB (ref 0–149)
VLDL CHOLESTEROL CAL: 38 mg/dL (ref 5–40)

## 2016-09-03 LAB — TSH: TSH: 2.36 u[IU]/mL (ref 0.450–4.500)

## 2016-09-09 ENCOUNTER — Ambulatory Visit
Admission: RE | Admit: 2016-09-09 | Discharge: 2016-09-09 | Disposition: A | Discharge status: Home / Self Care | Payer: Medicare Other | Source: Ambulatory Visit | Attending: Internal Medicine | Admitting: Internal Medicine

## 2016-09-09 DIAGNOSIS — Z1231 Encounter for screening mammogram for malignant neoplasm of breast: Secondary | ICD-10-CM | POA: Insufficient documentation

## 2016-10-06 DIAGNOSIS — H2513 Age-related nuclear cataract, bilateral: Secondary | ICD-10-CM | POA: Diagnosis not present

## 2017-04-11 ENCOUNTER — Ambulatory Visit: Payer: Medicare Other

## 2017-04-28 DIAGNOSIS — Z23 Encounter for immunization: Secondary | ICD-10-CM | POA: Diagnosis not present

## 2017-05-19 ENCOUNTER — Encounter: Payer: Self-pay | Admitting: Internal Medicine

## 2017-07-15 ENCOUNTER — Other Ambulatory Visit: Payer: Self-pay | Admitting: Internal Medicine

## 2017-07-15 DIAGNOSIS — I10 Essential (primary) hypertension: Secondary | ICD-10-CM

## 2017-07-19 DIAGNOSIS — E041 Nontoxic single thyroid nodule: Secondary | ICD-10-CM

## 2017-07-19 HISTORY — DX: Nontoxic single thyroid nodule: E04.1

## 2017-07-19 HISTORY — PX: BIOPSY THYROID: PRO38

## 2017-07-28 ENCOUNTER — Other Ambulatory Visit: Payer: Self-pay | Admitting: Internal Medicine

## 2017-07-28 DIAGNOSIS — Z1231 Encounter for screening mammogram for malignant neoplasm of breast: Secondary | ICD-10-CM

## 2017-09-05 ENCOUNTER — Ambulatory Visit: Payer: Medicare Other

## 2017-09-05 VITALS — BP 138/70 | HR 82 | Temp 97.8°F | Resp 12 | Ht 64.0 in | Wt 169.0 lb

## 2017-09-05 DIAGNOSIS — Z0000 Encounter for general adult medical examination without abnormal findings: Secondary | ICD-10-CM

## 2017-09-05 NOTE — Progress Notes (Signed)
Subjective:   Monique Hall is a 77 y.o. female who presents for Medicare Annual (Subsequent) preventive examination.  Review of Systems:  N/A Cardiac Risk Factors include: advanced age (>19men, >51 women);dyslipidemia;hypertension;sedentary lifestyle     Objective:     Vitals: BP 138/70 (BP Location: Right Arm, Patient Position: Sitting, Cuff Size: Normal)   Pulse 82   Temp 97.8 F (36.6 C) (Oral)   Resp 12   Ht 5\' 4"  (1.626 m)   Wt 169 lb (76.7 kg)   BMI 29.01 kg/m   Body mass index is 29.01 kg/m.  Advanced Directives 09/05/2017 09/02/2016 04/10/2016 08/29/2015 07/24/2015  Does Patient Have a Medical Advance Directive? No No No No No  Would patient like information on creating a medical advance directive? Yes (MAU/Ambulatory/Procedural Areas - Information given) - No - patient declined information No - patient declined information No - patient declined information    Tobacco Social History   Tobacco Use  Smoking Status Former Smoker  . Packs/day: 1.00  . Years: 35.00  . Pack years: 35.00  . Types: Cigarettes  . Last attempt to quit: 09/01/2002  . Years since quitting: 15.0  Smokeless Tobacco Never Used  Tobacco Comment   smoking cessation materials not required     Counseling given: No Comment: smoking cessation materials not required   Clinical Intake:  Pre-visit preparation completed: Yes  Pain : No/denies pain     BMI - recorded: 29.01 Nutritional Status: BMI 25 -29 Overweight Nutritional Risks: None Diabetes: No  How often do you need to have someone help you when you read instructions, pamphlets, or other written materials from your doctor or pharmacy?: 1 - Never  Interpreter Needed?: No  Information entered by :: AEversole, LPN  Past Medical History:  Diagnosis Date  . Esophagitis   . Gastritis   . Hypercholesteremia   . Hypertension   . Sciatica   . Tendonitis, Achilles 2016   both    Past Surgical History:  Procedure Laterality Date    . ABDOMINAL HYSTERECTOMY  1985   total for bleeding  . APPENDECTOMY    . BLADDER SUSPENSION  2004   Family History  Problem Relation Age of Onset  . CAD Mother   . Diabetes Mother   . Parkinson's disease Father   . COPD Father   . Breast cancer Neg Hx    Social History   Socioeconomic History  . Marital status: Widowed    Spouse name: None  . Number of children: 3  . Years of education: None  . Highest education level: 12th grade  Social Needs  . Financial resource strain: Not hard at all  . Food insecurity - worry: Never true  . Food insecurity - inability: Never true  . Transportation needs - medical: No  . Transportation needs - non-medical: No  Occupational History  . Occupation: Retired  Tobacco Use  . Smoking status: Former Smoker    Packs/day: 1.00    Years: 35.00    Pack years: 35.00    Types: Cigarettes    Last attempt to quit: 09/01/2002    Years since quitting: 15.0  . Smokeless tobacco: Never Used  . Tobacco comment: smoking cessation materials not required  Substance and Sexual Activity  . Alcohol use: No    Alcohol/week: 0.0 oz  . Drug use: No  . Sexual activity: No  Other Topics Concern  . None  Social History Narrative  . None    Outpatient Encounter Medications  as of 09/05/2017  Medication Sig  . Black Cohosh 200 MG CAPS Take 1 tablet by mouth daily. Reported on 08/20/2015  . cetirizine (ZYRTEC) 10 MG tablet Take 10 mg by mouth daily. Reported on 08/20/2015  . Coenzyme Q10-Vitamin E (QUNOL ULTRA COQ10) 100-150 MG-UNIT CAPS Take 100 mg by mouth every morning.  Marland Kitchen losartan-hydrochlorothiazide (HYZAAR) 100-12.5 MG tablet TAKE 1 TABLET EVERY DAY  . Multiple Vitamin (MULTIVITAMIN) tablet Take 1 tablet by mouth daily.  . Omega-3 Fatty Acids (FISH OIL) 1200 MG CPDR Take 1 capsule by mouth 2 (two) times daily.  Marland Kitchen omeprazole (PRILOSEC) 20 MG capsule Take 20 mg by mouth as needed. Reported on 07/24/2015  . [DISCONTINUED] estradiol (ESTRACE VAGINAL) 0.1 MG/GM  vaginal cream Place 1 Applicatorful vaginally at bedtime. Reported on 08/20/2015   No facility-administered encounter medications on file as of 09/05/2017.     Activities of Daily Living In your present state of health, do you have any difficulty performing the following activities: 09/05/2017  Hearing? N  Comment denies wearing hearing aids  Vision? N  Comment wears eyeglasses  Difficulty concentrating or making decisions? N  Walking or climbing stairs? N  Dressing or bathing? N  Doing errands, shopping? N  Preparing Food and eating ? N  Comment denies wearing dentures  Using the Toilet? N  In the past six months, have you accidently leaked urine? N  Do you have problems with loss of bowel control? N  Managing your Medications? N  Managing your Finances? N  Housekeeping or managing your Housekeeping? N  Some recent data might be hidden    [name], MD as PCP - General (Family Medicine)    Assessment:   This is a routine wellness examination for [facility].  Exercise Activities and Dietary recommendations Current Exercise Habits: The patient does not participate in regular exercise at present, Exercise limited by: None identified  Goals    . DIET - INCREASE WATER INTAKE     Recommend to drink at least 6-8 8oz glasses of water per day.       Fall Risk Fall Risk  09/05/2017 09/02/2016 08/29/2015 01/22/2015  Falls in the past year? No No No No   Is the patient's home free of loose throw rugs in walkways, pet beds, electrical cords, etc?   Yes Does the patient have any grab bars in the bathroom? No  Does the patient use a shower chair when bathing? Yes Does the patient have any stairs in or around the home? Yes If so, are there any handrails?  Yes Does the patient have adequate lighting?  Yes Does the patient use a cane, walker or w/c? No Does the patient use of an elevated toilet seat? Yes   Timed Get Up and Go Performed: Yes. Pt ambulated 10 feet  within 12 sec. Gait stead-fast and without the use of an assistive device. No intervention required at this time. Fall risk prevention has been discussed.  Pt declined my offer to send Community Resource Referral to Care Guide for installation of grabs bars in the shower.  Depression Screen PHQ 2/9 Scores 09/05/2017 09/05/2017 09/02/2016 08/29/2015  PHQ - 2 Score 0 0 0 0  PHQ- 9 Score 0 - - -     Cognitive Function     6CIT Screen 09/05/2017 09/02/2016  What Year? 0 points 0 points  What month? 0 points 0 points  What time? 0 points 0 points  Count back from 20 0 points  0 points  Months in reverse 0 points 0 points  Repeat phrase 6 points 0 points  Total Score 6 0    Immunization History  Administered Date(s) Administered  . Influenza-Unspecified 04/22/2015, 04/28/2017  . Pneumococcal Conjugate-13 06/24/2014  . Pneumococcal Polysaccharide-23 07/21/2011  . Tdap 07/20/2005, 08/29/2015  . Zoster 07/20/2000    Qualifies for Shingles Vaccine? Yes. Zostavax completed 07/20/00. Due for Shingrix vaccine. Education has been provided regarding the importance of this vaccine. Pt has been advised to call her insurance company to determine her out of pocket expense. Advised she may also receive this vaccine at her local pharmacy or Health Dept. Verbalized acceptance and understanding.  Screening Tests Health Maintenance  Topic Date Due  . MAMMOGRAM  09/09/2017  . TETANUS/TDAP  08/28/2025  . INFLUENZA VACCINE  Completed  . PNA vac Low Risk Adult  Completed  . DEXA SCAN  Addressed    Cancer Screenings: Lung: Low Dose CT Chest recommended if Age 37-80 years, 30 pack-year currently smoking OR have quit w/in 15years. Patient does qualify. An Epic message has been sent to Burgess Estelle, RN (Oncology Nurse Navigator) regarding the possible need for this exam. Raquel Sarna will review the patient's chart to determine if the patient truly qualifies for the exam. If the patient qualifies, Raquel Sarna will order the  Low Dose CT of the chest to facilitate the scheduling of this exam. Breast:  Up to date on Mammogram? Yes. Completed 09/09/16. Repeat every year. Scheduled for repeat mammogram on 09/12/17   Up to date of Bone Density/Dexa? Yes. Completed 01/15/09. Osteoporotic screening no longer required. Colorectal: Completed colonoscopy 07/20/10. Repeat every 10 years.  Additional Screenings: Hepatitis B/HIV/Syphillis: Does not qualify Hepatitis C Screening: Does not qualify     Plan:  I have personally reviewed and addressed the Medicare Annual Wellness questionnaire and have noted the following in the patient's chart:  A. Medical and social history B. Use of alcohol, tobacco or illicit drugs  C. Current medications and supplements D. Functional ability and status E.  Nutritional status F.  Physical activity G. Advance directives H. List of other physicians I.  Hospitalizations, surgeries, and ER visits in previous 12 months J.  [place] such as hearing and vision if needed, cognitive and depression L. Referrals and appointments - none  In addition, I have reviewed and discussed with patient certain preventive protocols, quality metrics, and best practice recommendations. A written personalized care plan for preventive services as well as general preventive health recommendations were provided to patient.  Signed,  Aleatha Borer, LPN Nurse Health Advisor  MD Recommendations: Zostavax completed 07/20/00. Due for Shingrix vaccine. Education has been provided regarding the importance of this vaccine. Pt has been advised to call her insurance company to determine her out of pocket expense. Advised she may also receive this vaccine at her local pharmacy or Health Dept. Verbalized acceptance and understanding.  Due for mammogram. Completed 09/09/16. Repeat every year. Scheduled for repeat mammogram on 09/12/17   Lung Cancer Screening: Patient does qualify. An Epic message has been sent to Burgess Estelle, RN (Oncology Nurse Navigator) regarding the possible need for this exam. Raquel Sarna will review the patient's chart to determine if the patient truly qualifies for the exam. If the patient qualifies, Raquel Sarna will order the Low Dose CT of the chest to facilitate the scheduling of this exam.

## 2017-09-05 NOTE — Patient Instructions (Signed)
Monique Hall , Thank you for taking time to come for your Medicare Wellness Visit. I appreciate your ongoing commitment to your health goals. Please review the following plan we discussed and let me know if I can assist you in the future.   Screening recommendations/referrals: Colorectal Screening: Completed colonoscopy 07/20/10. Repeat every 10 years. Mammogram: Completed 09/09/16. Repeat every year. Scheduled for repeat mammogram on 09/12/17  Bone Density: Completed 01/15/09. Osteoporotic screening no longer required.  Vision/Dental Exams: Recommended yearly ophthalmology/optometry visit for glaucoma screening and checkup Recommended yearly dental visit for hygiene and checkup  Vaccinations: Influenza vaccine: Up to date Pneumococcal vaccine: Completed series Tdap vaccine: Up to date Shingles vaccine: Please call your insurance company to determine your out of pocket expense for the Shingrix vaccine. You may also receive this vaccine at your local pharmacy or Health Dept.    Advanced directives: Advance directive discussed with you today. I have provided a copy for you to complete at home and have notarized. Once this is complete please bring a copy in to our office so we can scan it into your chart.  Conditions/risks identified: Recommend to drink at least 6-8 8oz glasses of water per day.  Next appointment: You are scheduled to see Dr. Army Melia on 09/08/17 @ 8:30am.   Please schedule your Annual Wellness Visit with your Nurse Health Advisor in one year.  Preventive Care 63 Years and Older, Female Preventive care refers to lifestyle choices and visits with your health care provider that can promote health and wellness. What does preventive care include?  A yearly physical exam. This is also called an annual well check.  Dental exams once or twice a year.  Routine eye exams. Ask your health care provider how often you should have your eyes checked.  Personal lifestyle choices,  including:  Daily care of your teeth and gums.  Regular physical activity.  Eating a healthy diet.  Avoiding tobacco and drug use.  Limiting alcohol use.  Practicing safe sex.  Taking low-dose aspirin every day.  Taking vitamin and mineral supplements as recommended by your health care provider. What happens during an annual well check? The services and screenings done by your health care provider during your annual well check will depend on your age, overall health, lifestyle risk factors, and family history of disease. Counseling  Your health care provider may ask you questions about your:  Alcohol use.  Tobacco use.  Drug use.  Emotional well-being.  Home and relationship well-being.  Sexual activity.  Eating habits.  History of falls.  Memory and ability to understand (cognition).  Work and work Statistician.  Reproductive health. Screening  You may have the following tests or measurements:  Height, weight, and BMI.  Blood pressure.  Lipid and cholesterol levels. These may be checked every 5 years, or more frequently if you are over 31 years old.  Skin check.  Lung cancer screening. You may have this screening every year starting at age 23 if you have a 30-pack-year history of smoking and currently smoke or have quit within the past 15 years.  Fecal occult blood test (FOBT) of the stool. You may have this test every year starting at age 5.  Flexible sigmoidoscopy or colonoscopy. You may have a sigmoidoscopy every 5 years or a colonoscopy every 10 years starting at age 26.  Hepatitis C blood test.  Hepatitis B blood test.  Sexually transmitted disease (STD) testing.  Diabetes screening. This is done by checking your blood sugar (glucose)  after you have not eaten for a while (fasting). You may have this done every 1-3 years.  Bone density scan. This is done to screen for osteoporosis. You may have this done starting at age 66.  Mammogram. This  may be done every 1-2 years. Talk to your health care provider about how often you should have regular mammograms. Talk with your health care provider about your test results, treatment options, and if necessary, the need for more tests. Vaccines  Your health care provider may recommend certain vaccines, such as:  Influenza vaccine. This is recommended every year.  Tetanus, diphtheria, and acellular pertussis (Tdap, Td) vaccine. You may need a Td booster every 10 years.  Zoster vaccine. You may need this after age 5.  Pneumococcal 13-valent conjugate (PCV13) vaccine. One dose is recommended after age 61.  Pneumococcal polysaccharide (PPSV23) vaccine. One dose is recommended after age 89. Talk to your health care provider about which screenings and vaccines you need and how often you need them. This information is not intended to replace advice given to you by your health care provider. Make sure you discuss any questions you have with your health care provider. Document Released: 08/01/2015 Document Revised: 03/24/2016 Document Reviewed: 05/06/2015 Elsevier Interactive Patient Education  2017 [place] Prevention in the Home Falls can cause injuries. They can happen to people of all ages. There are many things you can do to make your home safe and to help prevent falls. What can I do on the outside of my home?  Regularly fix the edges of walkways and driveways and fix any cracks.  Remove anything that might make you trip as you walk through a door, such as a raised step or threshold.  Trim any bushes or trees on the path to your home.  Use bright outdoor lighting.  Clear any walking paths of anything that might make someone trip, such as rocks or tools.  Regularly check to see if handrails are loose or broken. Make sure that both sides of any steps have handrails.  Any raised decks and porches should have guardrails on the edges.  Have any leaves, snow, or ice cleared  regularly.  Use sand or salt on walking paths during winter.  Clean up any spills in your garage right away. This includes oil or grease spills. What can I do in the bathroom?  Use night lights.  Install grab bars by the toilet and in the tub and shower. Do not use towel bars as grab bars.  Use non-skid mats or decals in the tub or shower.  If you need to sit down in the shower, use a plastic, non-slip stool.  Keep the floor dry. Clean up any water that spills on the floor as soon as it happens.  Remove soap buildup in the tub or shower regularly.  Attach bath mats securely with double-sided non-slip rug tape.  Do not have throw rugs and other things on the floor that can make you trip. What can I do in the bedroom?  Use night lights.  Make sure that you have a light by your bed that is easy to reach.  Do not use any sheets or blankets that are too big for your bed. They should not hang down onto the floor.  Have a firm chair that has side arms. You can use this for support while you get dressed.  Do not have throw rugs and other things on the floor that can make you  trip. What can I do in the kitchen?  Clean up any spills right away.  Avoid walking on wet floors.  Keep items that you use a lot in easy-to-reach places.  If you need to reach something above you, use a strong step stool that has a grab bar.  Keep electrical cords out of the way.  Do not use floor polish or wax that makes floors slippery. If you must use wax, use non-skid floor wax.  Do not have throw rugs and other things on the floor that can make you trip. What can I do with my stairs?  Do not leave any items on the stairs.  Make sure that there are handrails on both sides of the stairs and use them. Fix handrails that are broken or loose. Make sure that handrails are as long as the stairways.  Check any carpeting to make sure that it is firmly attached to the stairs. Fix any carpet that is loose  or worn.  Avoid having throw rugs at the top or bottom of the stairs. If you do have throw rugs, attach them to the floor with carpet tape.  Make sure that you have a light switch at the top of the stairs and the bottom of the stairs. If you do not have them, ask someone to add them for you. What else can I do to help prevent falls?  Wear shoes that:  Do not have high heels.  Have rubber bottoms.  Are comfortable and fit you well.  Are closed at the toe. Do not wear sandals.  If you use a stepladder:  Make sure that it is fully opened. Do not climb a closed stepladder.  Make sure that both sides of the stepladder are locked into place.  Ask someone to hold it for you, if possible.  Clearly mark and make sure that you can see:  Any grab bars or handrails.  First and last steps.  Where the edge of each step is.  Use tools that help you move around (mobility aids) if they are needed. These include:  Canes.  Walkers.  Scooters.  Crutches.  Turn on the lights when you go into a dark area. Replace any light bulbs as soon as they burn out.  Set up your furniture so you have a clear path. Avoid moving your furniture around.  If any of your floors are uneven, fix them.  If there are any pets around you, be aware of where they are.  Review your medicines with your doctor. Some medicines can make you feel dizzy. This can increase your chance of falling. Ask your doctor what other things that you can do to help prevent falls. This information is not intended to replace advice given to you by your health care provider. Make sure you discuss any questions you have with your health care provider. Document Released: 05/01/2009 Document Revised: 12/11/2015 Document Reviewed: 08/09/2014 Elsevier Interactive Patient Education  2017 [employer].

## 2017-09-06 ENCOUNTER — Telehealth: Payer: Self-pay | Admitting: *Deleted

## 2017-09-06 DIAGNOSIS — Z87891 Personal history of nicotine dependence: Secondary | ICD-10-CM

## 2017-09-06 DIAGNOSIS — Z122 Encounter for screening for malignant neoplasm of respiratory organs: Secondary | ICD-10-CM

## 2017-09-06 NOTE — Telephone Encounter (Signed)
Received referral for initial lung cancer screening scan. Contacted patient and obtained smoking history,(former, quit 2004, 35 pack year) as well as answering questions related to screening process. Patient denies signs of lung cancer such as weight loss or hemoptysis. Patient denies comorbidity that would prevent curative treatment if lung cancer were found. Patient is scheduled for shared decision making visit and CT scan on 09/29/17.

## 2017-09-08 ENCOUNTER — Encounter: Payer: Self-pay | Admitting: Internal Medicine

## 2017-09-08 ENCOUNTER — Ambulatory Visit (INDEPENDENT_AMBULATORY_CARE_PROVIDER_SITE_OTHER): Payer: Medicare Other | Admitting: Internal Medicine

## 2017-09-08 VITALS — BP 128/68 | HR 77 | Ht 64.0 in | Wt 168.0 lb

## 2017-09-08 DIAGNOSIS — Z1231 Encounter for screening mammogram for malignant neoplasm of breast: Secondary | ICD-10-CM | POA: Diagnosis not present

## 2017-09-08 DIAGNOSIS — I10 Essential (primary) hypertension: Secondary | ICD-10-CM | POA: Diagnosis not present

## 2017-09-08 DIAGNOSIS — E782 Mixed hyperlipidemia: Secondary | ICD-10-CM | POA: Diagnosis not present

## 2017-09-08 DIAGNOSIS — F17201 Nicotine dependence, unspecified, in remission: Secondary | ICD-10-CM

## 2017-09-08 DIAGNOSIS — K219 Gastro-esophageal reflux disease without esophagitis: Secondary | ICD-10-CM | POA: Diagnosis not present

## 2017-09-08 NOTE — Patient Instructions (Signed)
Welchol 3 tabs twice a day

## 2017-09-08 NOTE — Progress Notes (Signed)
Date:  09/08/2017   Name:  Monique Hall   DOB:  May 27, 1941   MRN:  [id number]   Chief Complaint: Gastroesophageal Reflux; Hypertension; and Yearly Check Up (Breast Exam.) Hypertension  This is a chronic problem. The problem is controlled. Pertinent negatives include no chest pain, headaches, palpitations or shortness of breath. Past treatments include diuretics and angiotensin blockers. The current treatment provides significant improvement. There is no history of kidney disease, CAD/MI, CVA or PVD.  Gastroesophageal Reflux  She complains of heartburn. She reports no abdominal pain, no chest pain, no coughing, no sore throat, no tooth decay or no wheezing. This is a recurrent problem. The problem occurs rarely. Pertinent negatives include no fatigue. She has tried a PPI for the symptoms.  Hyperlipidemia  This is a chronic problem. Recent lipid tests were reviewed and are variable. Pertinent negatives include no chest pain or shortness of breath. Current antihyperlipidemic treatment includes diet change. The current treatment provides no improvement of lipids. Compliance problems include medication side effects (severe myalgia with several statins).   Hx Tobacco use - pt was seen for MAW last week and referred for low dose screening CT chest. Breast cancer screening - denies breast issues.  Mammogram is already scheduled.   Review of Systems  Constitutional: Negative for chills, fatigue and fever.  HENT: Negative for congestion, hearing loss, sore throat, tinnitus, trouble swallowing and voice change.   Eyes: Negative for visual disturbance.  Respiratory: Negative for cough, chest tightness, shortness of breath and wheezing.   Cardiovascular: Negative for chest pain, palpitations and leg swelling.  Gastrointestinal: Positive for heartburn. Negative for abdominal pain, constipation, diarrhea and vomiting.  Endocrine: Negative for polydipsia and polyuria.  Genitourinary: Negative for dysuria,  frequency, genital sores, vaginal bleeding and vaginal discharge.  Musculoskeletal: Negative for arthralgias, gait problem and joint swelling.  Skin: Negative for color change and rash.  Neurological: Negative for dizziness, tremors, light-headedness and headaches.  Hematological: Negative for adenopathy. Does not bruise/bleed easily.  Psychiatric/Behavioral: Negative for dysphoric mood and sleep disturbance. The patient is not nervous/anxious.     Patient Active Problem List   Diagnosis Date Noted  . Gastroesophageal reflux disease without esophagitis 09/02/2016  . Essential hypertension 07/29/2015  . Mixed hyperlipidemia 07/29/2015  . Menopausal symptoms 07/29/2015  . Tobacco use disorder, moderate, in sustained remission 07/29/2015  . Degeneration of intervertebral disc of lumbar region 06/17/2015  . 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 Sacroiliac strain 01/28/2015    Prior to Admission medications   Medication Sig Start Date End Date Taking? Authorizing Provider  Black Cohosh 200 MG CAPS Take 1 tablet by mouth daily. Reported on 08/20/2015    [provider]  cetirizine (ZYRTEC) 10 MG tablet Take 10 mg by mouth daily. Reported on 08/20/2015    [provider]  Coenzyme Q10-Vitamin E (QUNOL ULTRA COQ10) 100-150 MG-UNIT CAPS Take 100 mg by mouth every morning.    [provider]  losartan-hydrochlorothiazide [facility]) 100-12.5 MG tablet TAKE 1 TABLET EVERY DAY 07/15/17   Glean Hess, MD  Multiple Vitamin (MULTIVITAMIN) tablet Take 1 tablet by mouth daily.    [provider]  Omega-3 Fatty Acids (FISH OIL) 1200 MG CPDR Take 1 capsule by mouth 2 (two) times daily.    [provider]  omeprazole (PRILOSEC) 20 MG capsule Take 20 mg by mouth as needed. Reported on 07/24/2015    [provider]    Allergies  Allergen Reactions  . Baclofen     Other reaction(s): Unknown  . Etodolac Nausea  Only    Past Surgical History:  Procedure Laterality Date  . ABDOMINAL HYSTERECTOMY  1985   total for bleeding  . APPENDECTOMY    . BLADDER SUSPENSION  2004    Social History   Tobacco Use  . Smoking status: Former Smoker    Packs/day: 1.00    Years: 35.00    Pack years: 35.00    Types: Cigarettes    Last attempt to quit: 09/01/2002    Years since quitting: 15.0  . Smokeless tobacco: Never Used  . Tobacco comment: smoking cessation materials not required  Substance Use Topics  . Alcohol use: No    Alcohol/week: 0.0 oz  . Drug use: No     Medication list has been reviewed and updated.  PHQ 2/9 Scores 09/05/2017 09/05/2017 09/02/2016 08/29/2015  PHQ - 2 Score 0 0 0 0  PHQ- 9 Score 0 - - -    Physical Exam  Constitutional: She is oriented to person, place, and time. She appears well-developed and well-nourished. No distress.  HENT:  Head: Normocephalic and atraumatic.  Right Ear: Tympanic membrane and ear canal normal.  Left Ear: Tympanic membrane and ear canal normal.  Nose: Right sinus exhibits no maxillary sinus tenderness. Left sinus exhibits no maxillary sinus tenderness.  Mouth/Throat: Uvula is midline and oropharynx is clear and moist.  Eyes: Conjunctivae and EOM are normal. Right eye exhibits no discharge. Left eye exhibits no discharge. No scleral icterus.  Neck: Normal range of motion. Carotid bruit is not present. No erythema present. No thyromegaly present.  Cardiovascular: Normal rate, regular rhythm, normal heart sounds and normal pulses.  Pulmonary/Chest: Effort normal. No respiratory distress. She has no wheezes. Right breast exhibits no mass, no nipple discharge, no skin change and no tenderness. Left breast exhibits no mass, no nipple discharge, no skin change and no tenderness.  Abdominal: Soft. Bowel sounds are normal. There is no hepatosplenomegaly. There is no tenderness. There is no CVA tenderness.  Musculoskeletal: Normal range of motion.    Lymphadenopathy:    She has no cervical adenopathy.    She has no axillary adenopathy.  Neurological: She is alert and oriented to person, place, and time. She has normal reflexes. No cranial nerve deficit or sensory deficit.  Skin: Skin is warm, dry and intact. No rash noted.  Psychiatric: She has a normal mood and affect. Her speech is normal and behavior is normal. Thought content normal.  Nursing note and vitals reviewed.   BP 128/68   Pulse 77   Ht 5\' 4"  (1.626 m)   Wt 168 lb (76.2 kg)   SpO2 94%   BMI 28.84 kg/m   Assessment and Plan: 1. Essential hypertension controlled - Comprehensive metabolic panel - TSH  2. Gastroesophageal reflux disease without esophagitis Controlled with PPI - CBC with Differential/Platelet  3. Mixed hyperlipidemia Intolerant to statins Samples of Welchol given Consider zetia since generic - Lipid panel  4. Tobacco use disorder, moderate, in sustained remission Screening CT chest pending  5. Encounter for screening mammogram for breast cancer scheduled  No orders of the defined types were placed in this encounter.   Partially dictated using Editor, commissioning. Any errors are unintentional.  Halina Maidens, MD [facility]  09/08/2017

## 2017-09-09 LAB — CBC WITH DIFFERENTIAL/PLATELET
BASOS ABS: 0.1 10*3/uL (ref 0.0–0.2)
Basos: 1 %
EOS (ABSOLUTE): 0.2 10*3/uL (ref 0.0–0.4)
Eos: 3 %
Hematocrit: 35.4 % (ref 34.0–46.6)
Hemoglobin: 12.4 g/dL (ref 11.1–15.9)
IMMATURE GRANS (ABS): 0 10*3/uL (ref 0.0–0.1)
IMMATURE GRANULOCYTES: 0 %
LYMPHS: 47 %
Lymphocytes Absolute: 3.1 10*3/uL (ref 0.7–3.1)
MCH: 30.8 pg (ref 26.6–33.0)
MCHC: 35 g/dL (ref 31.5–35.7)
MCV: 88 fL (ref 79–97)
MONOS ABS: 0.5 10*3/uL (ref 0.1–0.9)
Monocytes: 8 %
NEUTROS PCT: 41 %
Neutrophils Absolute: 2.8 10*3/uL (ref 1.4–7.0)
PLATELETS: 379 10*3/uL (ref 150–379)
RBC: 4.02 x10E6/uL (ref 3.77–5.28)
RDW: 14.7 % (ref 12.3–15.4)
WBC: 6.8 10*3/uL (ref 3.4–10.8)

## 2017-09-09 LAB — LIPID PANEL
Chol/HDL Ratio: 5.2 ratio — ABNORMAL HIGH (ref 0.0–4.4)
Cholesterol, Total: 295 mg/dL — ABNORMAL HIGH (ref 100–199)
HDL: 57 mg/dL (ref >39)
LDL Calculated: 206 mg/dL — ABNORMAL HIGH (ref 0–99)
TRIGLYCERIDES: 160 mg/dL — AB (ref 0–149)
VLDL CHOLESTEROL CAL: 32 mg/dL (ref 5–40)

## 2017-09-09 LAB — COMPREHENSIVE METABOLIC PANEL
A/G RATIO: 1.3 (ref 1.2–2.2)
ALT: 18 IU/L (ref 0–32)
AST: 18 IU/L (ref 0–40)
Albumin: 4.3 g/dL (ref 3.5–4.8)
Alkaline Phosphatase: 43 IU/L (ref 39–117)
BILIRUBIN TOTAL: 0.5 mg/dL (ref 0.0–1.2)
BUN/Creatinine Ratio: 19 (ref 12–28)
BUN: 16 mg/dL (ref 8–27)
CALCIUM: 10.1 mg/dL (ref 8.7–10.3)
CHLORIDE: 100 mmol/L (ref 96–106)
CO2: 20 mmol/L (ref 20–29)
Creatinine, Ser: 0.84 mg/dL (ref 0.57–1.00)
GFR calc non Af Amer: 68 mL/min/{1.73_m2} (ref >59)
GFR, EST AFRICAN AMERICAN: 78 mL/min/{1.73_m2} (ref >59)
GLUCOSE: 95 mg/dL (ref 65–99)
Globulin, Total: 3.2 g/dL (ref 1.5–4.5)
POTASSIUM: 4.5 mmol/L (ref 3.5–5.2)
Sodium: 141 mmol/L (ref 134–144)
TOTAL PROTEIN: 7.5 g/dL (ref 6.0–8.5)

## 2017-09-09 LAB — TSH: TSH: 2.97 u[IU]/mL (ref 0.450–4.500)

## 2017-09-12 ENCOUNTER — Ambulatory Visit
Admission: RE | Admit: 2017-09-12 | Discharge: 2017-09-12 | Disposition: A | Discharge status: Home / Self Care | Payer: Medicare Other | Source: Ambulatory Visit | Attending: Internal Medicine | Admitting: Internal Medicine

## 2017-09-12 ENCOUNTER — Other Ambulatory Visit: Payer: Self-pay | Admitting: Internal Medicine

## 2017-09-12 DIAGNOSIS — Z1231 Encounter for screening mammogram for malignant neoplasm of breast: Secondary | ICD-10-CM

## 2017-09-12 DIAGNOSIS — E782 Mixed hyperlipidemia: Secondary | ICD-10-CM

## 2017-09-12 MED ORDER — EZETIMIBE 10 MG PO TABS: 10.0000 mg | ORAL_TABLET | Freq: Every day | ORAL | 3 refills | Status: DC

## 2017-09-12 NOTE — Progress Notes (Signed)
Patient informed of labs. States Monique Hall is not approved through insurance. Looked it up with insurance and they will only approve the generic brand for Zetia. Please send new medication in.

## 2017-09-29 ENCOUNTER — Inpatient Hospital Stay: Payer: Medicare Other | Attending: Nurse Practitioner | Admitting: Nurse Practitioner

## 2017-09-29 ENCOUNTER — Inpatient Hospital Stay: Admission: RE | Admit: 2017-09-29 | Payer: Medicare Other | Source: Ambulatory Visit

## 2017-09-29 ENCOUNTER — Ambulatory Visit
Admission: RE | Admit: 2017-09-29 | Discharge: 2017-09-29 | Disposition: A | Discharge status: Home / Self Care | Payer: Medicare Other | Source: Ambulatory Visit | Attending: Nurse Practitioner | Admitting: Nurse Practitioner

## 2017-09-29 DIAGNOSIS — E041 Nontoxic single thyroid nodule: Secondary | ICD-10-CM | POA: Insufficient documentation

## 2017-09-29 DIAGNOSIS — Z87891 Personal history of nicotine dependence: Secondary | ICD-10-CM

## 2017-09-29 DIAGNOSIS — Z122 Encounter for screening for malignant neoplasm of respiratory organs: Secondary | ICD-10-CM

## 2017-09-29 DIAGNOSIS — J439 Emphysema, unspecified: Secondary | ICD-10-CM | POA: Diagnosis not present

## 2017-09-29 DIAGNOSIS — I700 Atherosclerosis of aorta: Secondary | ICD-10-CM | POA: Diagnosis not present

## 2017-09-30 ENCOUNTER — Encounter: Payer: Self-pay | Admitting: *Deleted

## 2017-09-30 NOTE — Progress Notes (Signed)
In accordance with CMS guidelines, patient has met eligibility criteria including age, absence of signs or symptoms of lung cancer.  Social History   Tobacco Use  . Smoking status: Former Smoker    Packs/day: 1.00    Years: 35.00    Pack years: 35.00    Types: Cigarettes    Last attempt to quit: 09/01/2002    Years since quitting: 15.0  . Smokeless tobacco: Never Used  . Tobacco comment: smoking cessation materials not required  Substance Use Topics  . Alcohol use: No    Alcohol/week: 0.0 oz  . Drug use: No     A shared decision-making session was conducted prior to the performance of CT scan. This includes one or more decision aids, includes benefits and harms of screening, follow-up diagnostic testing, over-diagnosis, false positive rate, and total radiation exposure.  Counseling on the importance of adherence to annual lung cancer LDCT screening, impact of co-morbidities, and ability or willingness to undergo diagnosis and treatment is imperative for compliance of the program.  Counseling on the importance of continued smoking cessation for former smokers; the importance of smoking cessation for current smokers, and information about tobacco cessation interventions have been given to patient including [place] and [facility] [place] programs.  Written order for lung cancer screening with LDCT has been given to the patient and any and all questions have been answered to the best of my abilities.   Yearly follow up will be coordinated by Burgess Estelle, Thoracic Navigator.  Beckey Rutter, DNP, AGNP-C [place] [facility] [phone] (office) 09/30/17 3:32 PM

## 2017-10-06 ENCOUNTER — Telehealth: Payer: Self-pay

## 2017-10-06 ENCOUNTER — Other Ambulatory Visit: Payer: Self-pay | Admitting: Internal Medicine

## 2017-10-06 DIAGNOSIS — E041 Nontoxic single thyroid nodule: Secondary | ICD-10-CM

## 2017-10-06 NOTE — Telephone Encounter (Signed)
Order placed

## 2017-10-06 NOTE — Telephone Encounter (Signed)
Patient called stating she just had a Chest CT done one 09/29/2017. It stated they recommend a Thyroid US. Wanted to know if we could be the one to order this for her.  Please Advise.  CB# [phone]

## 2017-10-06 NOTE — Progress Notes (Signed)
DISREGARD previous telephone msg.   Patient would like the thyroid US ordered and can only do Thursday or Fridays for her appt. Please Advise.

## 2017-10-14 ENCOUNTER — Ambulatory Visit
Admission: RE | Admit: 2017-10-14 | Discharge: 2017-10-14 | Disposition: A | Discharge status: Home / Self Care | Payer: Medicare Other | Source: Ambulatory Visit | Attending: Internal Medicine | Admitting: Internal Medicine

## 2017-10-14 DIAGNOSIS — E041 Nontoxic single thyroid nodule: Secondary | ICD-10-CM

## 2017-10-17 ENCOUNTER — Other Ambulatory Visit: Payer: Self-pay | Admitting: Internal Medicine

## 2017-10-17 DIAGNOSIS — E041 Nontoxic single thyroid nodule: Secondary | ICD-10-CM

## 2017-10-17 NOTE — Progress Notes (Signed)
Patient informed on Mychart. Agrees to see Dr Kathyrn Sheriff.

## 2017-10-18 NOTE — Progress Notes (Signed)
Patient informed. 

## 2017-11-03 DIAGNOSIS — R430 Anosmia: Secondary | ICD-10-CM | POA: Diagnosis not present

## 2017-11-03 DIAGNOSIS — E041 Nontoxic single thyroid nodule: Secondary | ICD-10-CM | POA: Diagnosis not present

## 2017-11-08 ENCOUNTER — Other Ambulatory Visit: Payer: Self-pay | Admitting: Otolaryngology

## 2017-11-08 DIAGNOSIS — E041 Nontoxic single thyroid nodule: Secondary | ICD-10-CM

## 2017-11-11 ENCOUNTER — Other Ambulatory Visit: Payer: Self-pay | Admitting: Otolaryngology

## 2017-11-15 ENCOUNTER — Ambulatory Visit
Admission: RE | Admit: 2017-11-15 | Discharge: 2017-11-15 | Disposition: A | Discharge status: Home / Self Care | Payer: Medicare Other | Source: Ambulatory Visit | Attending: Otolaryngology | Admitting: Otolaryngology

## 2017-11-15 DIAGNOSIS — E049 Nontoxic goiter, unspecified: Secondary | ICD-10-CM | POA: Diagnosis not present

## 2017-11-15 DIAGNOSIS — E041 Nontoxic single thyroid nodule: Secondary | ICD-10-CM | POA: Diagnosis not present

## 2017-11-16 LAB — CYTOLOGY - NON PAP

## 2017-11-24 ENCOUNTER — Other Ambulatory Visit: Payer: Self-pay | Admitting: Otolaryngology

## 2017-11-24 DIAGNOSIS — E041 Nontoxic single thyroid nodule: Secondary | ICD-10-CM

## 2017-11-30 ENCOUNTER — Other Ambulatory Visit: Payer: Medicare Other

## 2018-03-19 HISTORY — PX: OTHER SURGICAL HISTORY: SHX169

## 2018-04-04 ENCOUNTER — Ambulatory Visit (INDEPENDENT_AMBULATORY_CARE_PROVIDER_SITE_OTHER): Payer: Medicare Other | Admitting: Internal Medicine

## 2018-04-04 ENCOUNTER — Encounter: Payer: Self-pay | Admitting: Internal Medicine

## 2018-04-04 VITALS — BP 132/60 | HR 97 | Ht 64.0 in | Wt 168.0 lb

## 2018-04-04 DIAGNOSIS — L723 Sebaceous cyst: Secondary | ICD-10-CM | POA: Diagnosis not present

## 2018-04-04 NOTE — Progress Notes (Signed)
Date:  04/04/2018   Name:  Monique Hall   DOB:  1940-11-03   MRN:  [id number]   Chief Complaint: Neck Pain (Noticed knot on neck about 2 days ago, Painful to move neck side to side. Spoke to after hours nurse line on Sunday and the told her to see PCP in 24 hours for evaluation. )  Neck Pain   This is a new problem. The current episode started in the past 7 days. The problem occurs constantly. The problem has been unchanged. Associated symptoms include headaches. Pertinent negatives include no chest pain, fever, numbness or weakness.  The lump has just become apparent but she has had intermittent neck tightness and headaches for some time.  Discomfort is much worse over the past 2-3 days. She has no injury, weakness in arms, fever or drainage from the lump.   Review of Systems  Constitutional: Negative for chills, fatigue and fever.  Respiratory: Negative for chest tightness and wheezing.   Cardiovascular: Negative for chest pain.  Musculoskeletal: Positive for neck pain and neck stiffness.  Skin:       Cyst swelling  Neurological: Positive for headaches. Negative for dizziness, weakness and numbness.  Hematological: Does not bruise/bleed easily.    Patient Active Problem List   Diagnosis Date Noted  . Thyroid nodule 10/06/2017  . Gastroesophageal reflux disease without esophagitis 09/02/2016  . Essential hypertension 07/29/2015  . Mixed hyperlipidemia 07/29/2015  . Menopausal symptoms 07/29/2015  . Tobacco use disorder, moderate, in sustained remission 07/29/2015  . Degeneration of intervertebral disc of lumbar region 06/17/2015  . 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 Sacroiliac strain 01/28/2015    Allergies  Allergen Reactions  . Baclofen     Other reaction(s): Unknown  . Etodolac Nausea Only    Past Surgical History:  Procedure Laterality Date  . ABDOMINAL HYSTERECTOMY  1985   total for bleeding  . APPENDECTOMY    .  BLADDER SUSPENSION  2004    Social History   Tobacco Use  . Smoking status: Former Smoker    Packs/day: 1.00    Years: 35.00    Pack years: 35.00    Types: Cigarettes    Last attempt to quit: 09/01/2002    Years since quitting: 15.6  . Smokeless tobacco: Never Used  . Tobacco comment: smoking cessation materials not required  Substance Use Topics  . Alcohol use: No    Alcohol/week: 0.0 standard drinks  . Drug use: No     Medication list has been reviewed and updated.  Current Meds  Medication Sig  . Black Cohosh 200 MG CAPS Take 1 tablet by mouth daily. Reported on 08/20/2015  . cetirizine (ZYRTEC) 10 MG tablet Take 10 mg by mouth daily. Reported on 08/20/2015  . ezetimibe (ZETIA) 10 MG tablet Take 1 tablet (10 mg total) by mouth daily.  Marland Kitchen losartan-hydrochlorothiazide (HYZAAR) 100-12.5 MG tablet TAKE 1 TABLET EVERY DAY  . omeprazole (PRILOSEC) 20 MG capsule Take 20 mg by mouth as needed. Reported on 07/24/2015    PHQ 2/9 Scores 09/05/2017 09/05/2017 09/02/2016 08/29/2015  PHQ - 2 Score 0 0 0 0  PHQ- 9 Score 0 - - -    Physical Exam  Constitutional: She is oriented to person, place, and time. She appears well-developed. No distress.  HENT:  Head: Normocephalic and atraumatic.  Pulmonary/Chest: Effort normal. No respiratory distress.  Musculoskeletal: Normal range of motion.       Arms:  Neurological: She is alert and oriented to person, place, and time. She has normal strength and normal reflexes. No cranial nerve deficit or sensory deficit.  Skin: Skin is warm and dry. No rash noted.  Psychiatric: She has a normal mood and affect. Her behavior is normal. Thought content normal.  Nursing note and vitals reviewed.   BP 132/60 (BP Location: Right Arm, Patient Position: Sitting, Cuff Size: Normal)   Pulse 97   Ht 5\' 4"  (1.626 m)   Wt 168 lb (76.2 kg)   SpO2 95%   BMI 28.84 kg/m   Assessment and Plan: 1. Sebaceous cyst Likely needs excision Take Aleve as needed for  discomfort - Ambulatory referral to General Surgery   No orders of the defined types were placed in this encounter.   Partially dictated using Editor, commissioning. Any errors are unintentional.  Halina Maidens, MD [facility]  04/04/2018

## 2018-04-06 ENCOUNTER — Ambulatory Visit (INDEPENDENT_AMBULATORY_CARE_PROVIDER_SITE_OTHER): Payer: Medicare Other | Admitting: Surgery

## 2018-04-06 ENCOUNTER — Encounter: Payer: Self-pay | Admitting: Surgery

## 2018-04-06 VITALS — BP 168/73 | HR 86 | Temp 97.7°F | Ht 64.5 in | Wt 166.0 lb

## 2018-04-06 DIAGNOSIS — L723 Sebaceous cyst: Secondary | ICD-10-CM

## 2018-04-06 NOTE — Progress Notes (Signed)
Surgical Clinic History and Physical  Referring provider:  Glean Hess, MD [address] [place],  [id number]  HISTORY OF PRESENT ILLNESS (HPI):  77 y.o. female presents for evaluation of posterior cervical mid-neck mass. Patient reports she experienced a severe headache and upper back/neck pain this past weekend, for which she attempted to massage her posterior neck/upper back for relieve, at which time she noticed a small lump along the back of her neck/upper back. She denies having previously been aware of this mass and denies any associated redness, drainage, fever/chills, or prior surgical procedures for it. She describes that the mass itself is only mildly sore, particularly when she touches. She requests that it be removed even if for the possibility that it could be contributing to her headaches and cervical neck/back pain. Patient otherwise denies and N/V, CP, or SOB.  PAST MEDICAL HISTORY (PMH):  Past Medical History:  Diagnosis Date  . Esophagitis   . Gastritis   . Hypercholesteremia   . Hypertension   . Sciatica   . Tendonitis, Achilles 2016   both      PAST SURGICAL HISTORY ([place]):  Past Surgical History:  Procedure Laterality Date  . ABDOMINAL HYSTERECTOMY  1985   total for bleeding  . APPENDECTOMY    . BLADDER SUSPENSION  2004     MEDICATIONS:  Prior to Admission medications   Medication Sig Start Date End Date Taking? Authorizing Provider  Black Cohosh 200 MG CAPS Take 1 tablet by mouth daily. Reported on 08/20/2015   Yes [provider]  cetirizine (ZYRTEC) 10 MG tablet Take 10 mg by mouth daily. Reported on 08/20/2015   Yes [provider]  ezetimibe (ZETIA) 10 MG tablet Take 1 tablet (10 mg total) by mouth daily. 09/12/17  Yes Glean Hess, MD  losartan-hydrochlorothiazide [facility]) 100-12.5 MG tablet TAKE 1 TABLET EVERY DAY 07/15/17  Yes Glean Hess, MD  omeprazole (PRILOSEC) 20 MG capsule Take 20 mg by mouth as  needed. Reported on 07/24/2015   Yes [provider]     ALLERGIES:  Allergies  Allergen Reactions  . Baclofen     Other reaction(s): Unknown  . Etodolac Nausea Only     SOCIAL HISTORY:  Social History   Socioeconomic History  . Marital status: Widowed    Spouse name: Not on file  . Number of children: 3  . Years of education: Not on file  . Highest education level: 12th grade  Occupational History  . Occupation: Retired  Scientific laboratory technician  . Financial resource strain: Not hard at all  . Food insecurity:    Worry: Never true    Inability: Never true  . Transportation needs:    Medical: No    Non-medical: No  Tobacco Use  . Smoking status: Former Smoker    Packs/day: 1.00    Years: 35.00    Pack years: 35.00    Types: Cigarettes    Last attempt to quit: 09/01/2002    Years since quitting: 15.6  . Smokeless tobacco: Never Used  . Tobacco comment: smoking cessation materials not required  Substance and Sexual Activity  . Alcohol use: No    Alcohol/week: 0.0 standard drinks  . Drug use: No  . Sexual activity: Never  Lifestyle  . Physical activity:    Days per week: 0 days    Minutes per session: 0 min  . Stress: Not at all  Relationships  . Social connections:    Talks on phone:  Patient refused    Gets together: Patient refused    Attends religious service: Patient refused    Active member of club or organization: Patient refused    Attends meetings of clubs or organizations: Patient refused    Relationship status: Widowed  . Intimate partner violence:    Fear of current or ex partner: No    Emotionally abused: No    Physically abused: No    Forced sexual activity: No  Other Topics Concern  . Not on file  Social History Narrative  . Not on file    The patient currently resides (home / rehab facility / nursing home): Home The patient normally is (ambulatory / bedbound): Ambulatory  FAMILY HISTORY:  Family History  Problem Relation Age of Onset  .  CAD Mother   . Diabetes Mother   . Parkinson's disease Father   . COPD Father   . Breast cancer Neg Hx     Otherwise negative/non-contributory.  REVIEW OF SYSTEMS:  Constitutional: denies any other weight loss, fever, chills, or sweats  Eyes: denies any other vision changes, history of eye injury  ENT: denies sore throat, hearing problems  Respiratory: denies shortness of breath, wheezing  Cardiovascular: denies chest pain, palpitations  Gastrointestinal: denies abdominal pain, N/V, or diarrhea Musculoskeletal: denies any other joint pains or cramps  Skin: Denies any other rashes or skin discolorations except as per HPI Neurological: denies any other headache, dizziness, weakness  Psychiatric: Denies any other depression, anxiety   All other review of systems were otherwise negative   VITAL SIGNS:  BP (!) 168/73   Pulse 86   Temp 97.7 F (36.5 C) (Skin)   Ht 5' 4.5" (1.638 m)   Wt 166 lb (75.3 kg)   BMI 28.05 kg/m   PHYSICAL EXAM:  Constitutional:  -- Normal body habitus  -- Awake, alert, and oriented x3  Eyes:  -- Pupils equally round and reactive to light  -- No scleral icterus  Ear, nose, throat:  -- No jugular venous distension -- No nasal drainage, bleeding Pulmonary:  -- No crackles  -- Equal breath sounds bilaterally -- Breathing non-labored at rest Cardiovascular:  -- S1, S2 present  -- No pericardial rubs  Gastrointestinal:  -- Abdomen soft, nontender, non-distended, no guarding/rebound  -- No abdominal masses appreciated, pulsatile or otherwise  Musculoskeletal and Integumentary:  -- Wounds or skin discoloration: 2 cm firm minimally tender to palpation mobile mid-posterior neck subcutaneous mass, initially indiscernible from the underlying vertebrae without any overlying erythema or fluctuance or drainage -- Extremities: B/L UE and LE FROM, hands and feet warm, no edema  Neurologic:  -- Motor function: Intact and symmetric -- Sensation: Intact and  symmetric  Labs:  CBC:  Lab Results  Component Value Date   WBC 6.8 09/08/2017   RBC 4.02 09/08/2017   RBC 2 (A) 01/22/2015   BMP:  Lab Results  Component Value Date   GLUCOSE 95 09/08/2017   CO2 20 09/08/2017   BUN 16 09/08/2017   CREATININE 0.84 09/08/2017   CALCIUM 10.1 09/08/2017     Imaging studies: No new pertinent imaging studies available for review   Assessment/Plan:  77 y.o. female with subtle but tender non-infected sebaceous cyst at the interface of patient's posterior neck and upper back overlying her prominent cervical spine, complicated by co-morbidities including HTN, hypercholesterolemia, GERD with gastritis, and chronic headaches and lower back pain.   - discussed with patient differential diagnoses and +/- relationships to headaches, if any   -  all risks, benefits, and alternatives to excision of mid-posterior neck mass were discussed with the patient, all of her questions were answered to her expressed satisfaction, patient expresses she wishes to proceed, and informed consent was obtained.  - will plan for in-office excision of mid-posterior subcutaneous neck mass, most likely sebaceous cyst, though unclear if contributing to patient's chronic headaches   - anticipate return to clinic 2 weeks following above planned procedure  - instructed to call if any questions or concerns  All of the above recommendations were discussed with the patient and patient's family, and all of patient's and family's questions were answered to her expressed satisfaction.  Thank you for the opportunity to participate in this patient's care.  -- Marilynne Drivers Rosana Hoes, MD, [place]: [facility] - Partnering for exceptional care. Office: [phone]

## 2018-04-06 NOTE — Patient Instructions (Signed)
Patient would like to have cyst removed. Please schedule patient with Dr.Davis for next week to have sebaceous cyst removed.

## 2018-04-07 ENCOUNTER — Encounter: Payer: Self-pay | Admitting: Surgery

## 2018-04-13 ENCOUNTER — Ambulatory Visit: Payer: Medicare Other | Admitting: Surgery

## 2018-04-13 ENCOUNTER — Encounter: Payer: Self-pay | Admitting: Surgery

## 2018-04-13 VITALS — BP 162/88 | HR 92 | Temp 97.8°F | Ht 64.5 in | Wt 166.0 lb

## 2018-04-13 DIAGNOSIS — D170 Benign lipomatous neoplasm of skin and subcutaneous tissue of head, face and neck: Secondary | ICD-10-CM | POA: Diagnosis not present

## 2018-04-13 DIAGNOSIS — L723 Sebaceous cyst: Secondary | ICD-10-CM

## 2018-04-13 DIAGNOSIS — D210 Benign neoplasm of connective and other soft tissue of head, face and neck: Secondary | ICD-10-CM | POA: Diagnosis not present

## 2018-04-13 NOTE — Patient Instructions (Signed)
You may shower 04/15/18.  Please do not submerge in water.  Please see your follow up appointment listed below.   No heavy lifting greater than 20 pounds for 2 weeks.

## 2018-04-13 NOTE — Procedures (Signed)
SURGICAL OPERATIVE REPORT  DATE OF PROCEDURE: 04/13/2018  ATTENDING: Corene Cornea E. Rosana Hoes, MD  ANESTHESIA: Local  PRE-OPERATIVE DIAGNOSIS: Enlarging and increasingly symptomatic (painful with headaches) sebaceous cyst of the midline lower posterior neck (icd-10: L72.3)   POST-OPERATIVE DIAGNOSIS: Enlarging and increasingly symptomatic (painful with headaches) sebaceous cyst of the midline lower posterior neck (icd-10: L72.3)   PROCEDURE(S):  1.) Excision of symptomatic (painful) 3 cm sebaceous cyst of the posterior neck (cpt: 53748)   INTRAOPERATIVE FINDINGS: 3 cm x 3 cm non-infected subcutaneous mass of the posterior neck (most likely a sebaceous cyst), removed intact  INTRAVENOUS FLUIDS: 0 mL crystalloid   ESTIMATED BLOOD LOSS: Minimal (< 20 mL)  URINE OUTPUT: No Foley  SPECIMENS: 3 cm x 3 cm non-infected subcutaneous mass of the mid-posterior neck overlying cervical spine (most likely a sebaceous cyst), removed intact  IMPLANTS: None  DRAINS: None  COMPLICATIONS: None apparent  CONDITION AT END OF PROCEDURE: Hemodynamically stable and awake  DISPOSITION OF PATIENT: PACU  INDICATIONS FOR PROCEDURE:  Patient is a 77 y.o. female who presented for an enlarging increasingly symptomatic (painful with headaches) sebaceous cyst of the midline lower posterior neck. Patient reports it's been present/growing for an unknown amount of time, but has recently become larger and increasingly associated with neck pain and headaches due to its location. Patient requested that it be removed so activities can be performed with less discomfort, and patient was accordingly referred for surgical evaluation and management. All risks, benefits, and alternatives to above procedure were discussed with the patient, all of patient's questions were answered to his expressed satisfaction, and informed consent was obtained and documented.   DETAILS OF PROCEDURE: Patient was brought to the procedure suite and  was appropriately identified. Laying on the procedural table with her Left side down, operative site was prepped and draped in the usual sterile fashion, and following a brief time out, a 3.5 cm long and 1 cm wide transverse elliptical incision was made using a #15 blade scalpel, and incision was extended deep around subcutaneous mass to fascia using sharp + blunt dissection. During the course of dissecting free the cyst, it was not disrupted and was removed intact. Direct pressure was applied for hemostasis. Further examination did not reveal any additional cyst. Hemostasis was achieved, and the wound was copiously irrigated with unused local anesthetic. Deep tissue and dermis were re-approximated using buried interrupted 3-0 Vicryl suture, and 4-0 Monocryl suture was used to re-approximate epidermis. Skin was then cleaned and dried, and sterile Dermabond skin glue was applied to the wound.  I was present for all aspects of the above procedure, and no operative complications were apparent.

## 2018-04-20 ENCOUNTER — Telehealth: Payer: Self-pay | Admitting: *Deleted

## 2018-04-20 ENCOUNTER — Telehealth: Payer: Self-pay

## 2018-04-20 NOTE — Telephone Encounter (Signed)
Patient notified of pathology.

## 2018-04-20 NOTE — Telephone Encounter (Signed)
Notified patient as instructed, patient pleased. Discussed follow-up appointments, patient agrees  

## 2018-04-20 NOTE — Telephone Encounter (Signed)
-----   Message from Vickie Epley, MD sent at 04/19/2018  7:04 PM EDT ----- We expected it to be benign, and it is, so I am okay with discussing at her follow-up or can call her to reassure. Either seems okay.  Thank you!         - Corene Cornea   ----- Message ----- From: Carson Myrtle, RN Sent: 04/19/2018   2:08 PM EDT To: Vickie Epley, MD  Hello, Her pathology results are in from the neck mass, can I call her with results or wait until her f/u appointment on 04-27-18. Thanks Rosann Auerbach

## 2018-04-27 ENCOUNTER — Encounter: Payer: Self-pay | Admitting: Surgery

## 2018-04-27 ENCOUNTER — Ambulatory Visit (INDEPENDENT_AMBULATORY_CARE_PROVIDER_SITE_OTHER): Payer: Medicare Other | Admitting: Surgery

## 2018-04-27 VITALS — BP 144/80 | HR 88 | Resp 13 | Ht 66.0 in | Wt 167.0 lb

## 2018-04-27 DIAGNOSIS — D170 Benign lipomatous neoplasm of skin and subcutaneous tissue of head, face and neck: Secondary | ICD-10-CM

## 2018-04-27 DIAGNOSIS — Z4889 Encounter for other specified surgical aftercare: Secondary | ICD-10-CM

## 2018-04-27 HISTORY — DX: Benign lipomatous neoplasm of skin and subcutaneous tissue of head, face and neck: D17.0

## 2018-04-27 NOTE — Patient Instructions (Signed)
Return as needed.The patient is aware to call back for any questions or concerns.  

## 2018-04-27 NOTE — Progress Notes (Signed)
Surgical Clinic Progress/Follow-up Note   HPI:  77 y.o. Female presents to clinic for post-op follow-up 2 weeks s/p excision of posterior neck subcutaneous mass Rosana Hoes, 04/13/2018). Patient reports complete resolution of pre-operative pain, says her headaches have much improved and she can now easily move her neck whereas it was pre-procedurally painful. She otherwise denies N/V, fever/chills, CP, or SOB.  Review of Systems:  Constitutional: denies fever/chills  Respiratory: denies shortness of breath, wheezing  Cardiovascular: denies chest pain, palpitations  Skin: Denies any other rashes or skin discolorations except post-surgical wounds as per interval history  Vital Signs:  BP (!) 144/80   Pulse 88   Resp 13   Ht 5\' 6"  (1.676 m)   Wt 167 lb (75.8 kg)   BMI 26.95 kg/m    Physical Exam:  Constitutional:  -- Normal body habitus  -- Awake, alert, and oriented x3  Pulmonary:  -- No crackles -- Equal breath sounds bilaterally -- Breathing non-labored at rest Cardiovascular:  -- S1, S2 present  -- No pericardial rubs  Musculoskeletal / Integumentary:  -- Wounds or skin discoloration: Posterior neck post-surgical incision well-approximated without any peri-incisional erythema or drainage -- Extremities: B/L UE and LE FROM, hands and feet warm, no edema   Imaging: No new pertinent imaging available for review  Assessment:  77 y.o. yo Female with a problem list including...  Patient Active Problem List   Diagnosis Date Noted  . Thyroid nodule 10/06/2017  . Gastroesophageal reflux disease without esophagitis 09/02/2016  . Essential hypertension 07/29/2015  . Mixed hyperlipidemia 07/29/2015  . Menopausal symptoms 07/29/2015  . Tobacco use disorder, moderate, in sustained remission 07/29/2015  . Degeneration of intervertebral disc of lumbar region 06/17/2015  . 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 Sacroiliac strain 01/28/2015    presents to clinic for post-op follow-up evaluation, doing well 2 weeks s/p excision of increasingly symptomatic posterior neck subcutaneous mass Rosana Hoes, 04/13/2018).  Plan:              - pathology results discussed with patient              - okay to submerge incisions under water (baths, swimming) prn             - gradually resume all activities without restrictions over next 2 weeks             - apply sunblock particularly to incisions with sun exposure to reduce pigmentation of scars             - return to clinic as needed, instructed to call office if any questions or concerns  All of the above recommendations were discussed with the patient, and all of patient's questions were answered to her expressed satisfaction.  -- Marilynne Drivers Rosana Hoes, MD, [place]: [facility] - Partnering for exceptional care. Office: [phone]

## 2018-05-09 DIAGNOSIS — Z23 Encounter for immunization: Secondary | ICD-10-CM | POA: Diagnosis not present

## 2018-07-31 ENCOUNTER — Other Ambulatory Visit: Payer: Self-pay | Admitting: Internal Medicine

## 2018-07-31 DIAGNOSIS — Z1231 Encounter for screening mammogram for malignant neoplasm of breast: Secondary | ICD-10-CM

## 2018-08-03 ENCOUNTER — Other Ambulatory Visit: Payer: Self-pay | Admitting: Internal Medicine

## 2018-08-03 DIAGNOSIS — I10 Essential (primary) hypertension: Secondary | ICD-10-CM

## 2018-08-03 DIAGNOSIS — E782 Mixed hyperlipidemia: Secondary | ICD-10-CM

## 2018-09-06 ENCOUNTER — Ambulatory Visit: Payer: Medicare Other

## 2018-09-07 ENCOUNTER — Ambulatory Visit: Payer: Medicare Other

## 2018-09-11 ENCOUNTER — Ambulatory Visit (INDEPENDENT_AMBULATORY_CARE_PROVIDER_SITE_OTHER): Payer: Medicare Other

## 2018-09-11 VITALS — BP 132/70 | HR 90 | Temp 98.0°F | Resp 16 | Ht 66.0 in | Wt 171.8 lb

## 2018-09-11 DIAGNOSIS — Z0000 Encounter for general adult medical examination without abnormal findings: Secondary | ICD-10-CM | POA: Diagnosis not present

## 2018-09-11 NOTE — Patient Instructions (Signed)
Monique Hall , Thank you for taking time to come for your Medicare Wellness Visit. I appreciate your ongoing commitment to your health goals. Please review the following plan we discussed and let me know if I can assist you in the future.   Screening recommendations/referrals: Colonoscopy: done 2012. No longer required.  Mammogram: done 09/12/17. Scheduled for 09/14/18.  Bone Density: done 2010. Please notify us if you would like to repeat this screening.  Recommended yearly ophthalmology/optometry visit for glaucoma screening and checkup Recommended yearly dental visit for hygiene and checkup  Vaccinations: Influenza vaccine: done 05/11/18 Pneumococcal vaccine: done 06/24/14. Tdap vaccine: done 08/29/15. Shingles vaccine: Shingrix discussed. Please contact your pharmacy for coverage information.     Advanced directives: Please bring a copy of your health care power of attorney and living will to the office at your convenience once you complete those documents.   Conditions/risks identified: Keep up the great work!  Next appointment: Please follow up in one year for your Medicare Annual Wellness visit.     Preventive Care 42 Years and Older, Female Preventive care refers to lifestyle choices and visits with your health care provider that can promote health and wellness. What does preventive care include?  A yearly physical exam. This is also called an annual well check.  Dental exams once or twice a year.  Routine eye exams. Ask your health care provider how often you should have your eyes checked.  Personal lifestyle choices, including:  Daily care of your teeth and gums.  Regular physical activity.  Eating a healthy diet.  Avoiding tobacco and drug use.  Limiting alcohol use.  Practicing safe sex.  Taking low-dose aspirin every day.  Taking vitamin and mineral supplements as recommended by your health care provider. What happens during an annual well check? The  services and screenings done by your health care provider during your annual well check will depend on your age, overall health, lifestyle risk factors, and family history of disease. Counseling  Your health care provider may ask you questions about your:  Alcohol use.  Tobacco use.  Drug use.  Emotional well-being.  Home and relationship well-being.  Sexual activity.  Eating habits.  History of falls.  Memory and ability to understand (cognition).  Work and work Statistician.  Reproductive health. Screening  You may have the following tests or measurements:  Height, weight, and BMI.  Blood pressure.  Lipid and cholesterol levels. These may be checked every 5 years, or more frequently if you are over 43 years old.  Skin check.  Lung cancer screening. You may have this screening every year starting at age 33 if you have a 30-pack-year history of smoking and currently smoke or have quit within the past 15 years.  Fecal occult blood test (FOBT) of the stool. You may have this test every year starting at age 42.  Flexible sigmoidoscopy or colonoscopy. You may have a sigmoidoscopy every 5 years or a colonoscopy every 10 years starting at age 37.  Hepatitis C blood test.  Hepatitis B blood test.  Sexually transmitted disease (STD) testing.  Diabetes screening. This is done by checking your blood sugar (glucose) after you have not eaten for a while (fasting). You may have this done every 1-3 years.  Bone density scan. This is done to screen for osteoporosis. You may have this done starting at age 39.  Mammogram. This may be done every 1-2 years. Talk to your health care provider about how often you should have  regular mammograms. Talk with your health care provider about your test results, treatment options, and if necessary, the need for more tests. Vaccines  Your health care provider may recommend certain vaccines, such as:  Influenza vaccine. This is recommended  every year.  Tetanus, diphtheria, and acellular pertussis (Tdap, Td) vaccine. You may need a Td booster every 10 years.  Zoster vaccine. You may need this after age 89.  Pneumococcal 13-valent conjugate (PCV13) vaccine. One dose is recommended after age 36.  Pneumococcal polysaccharide (PPSV23) vaccine. One dose is recommended after age 74. Talk to your health care provider about which screenings and vaccines you need and how often you need them. This information is not intended to replace advice given to you by your health care provider. Make sure you discuss any questions you have with your health care provider. Document Released: 08/01/2015 Document Revised: 03/24/2016 Document Reviewed: 05/06/2015 Elsevier Interactive Patient Education  2017 [place] [employer] Prevention in the Home Falls can cause injuries. They can happen to people of all ages. There are many things you can do to make your home safe and to help prevent falls. What can I do on the outside of my home?  Regularly fix the edges of walkways and driveways and fix any cracks.  Remove anything that might make you trip as you walk through a door, such as a raised step or threshold.  Trim any bushes or trees on the path to your home.  Use bright outdoor lighting.  Clear any walking paths of anything that might make someone trip, such as rocks or tools.  Regularly check to see if handrails are loose or broken. Make sure that both sides of any steps have handrails.  Any raised decks and porches should have guardrails on the edges.  Have any leaves, snow, or ice cleared regularly.  Use sand or salt on walking paths during winter.  Clean up any spills in your garage right away. This includes oil or grease spills. What can I do in the bathroom?  Use night lights.  Install grab bars by the toilet and in the tub and shower. Do not use towel bars as grab bars.  Use non-skid mats or decals in the tub or shower.  If  you need to sit down in the shower, use a plastic, non-slip stool.  Keep the floor dry. Clean up any water that spills on the floor as soon as it happens.  Remove soap buildup in the tub or shower regularly.  Attach bath mats securely with double-sided non-slip rug tape.  Do not have throw rugs and other things on the floor that can make you trip. What can I do in the bedroom?  Use night lights.  Make sure that you have a light by your bed that is easy to reach.  Do not use any sheets or blankets that are too big for your bed. They should not hang down onto the floor.  Have a firm chair that has side arms. You can use this for support while you get dressed.  Do not have throw rugs and other things on the floor that can make you trip. What can I do in the kitchen?  Clean up any spills right away.  Avoid walking on wet floors.  Keep items that you use a lot in easy-to-reach places.  If you need to reach something above you, use a strong step stool that has a grab bar.  Keep electrical cords out of the  way.  Do not use floor polish or wax that makes floors slippery. If you must use wax, use non-skid floor wax.  Do not have throw rugs and other things on the floor that can make you trip. What can I do with my stairs?  Do not leave any items on the stairs.  Make sure that there are handrails on both sides of the stairs and use them. Fix handrails that are broken or loose. Make sure that handrails are as long as the stairways.  Check any carpeting to make sure that it is firmly attached to the stairs. Fix any carpet that is loose or worn.  Avoid having throw rugs at the top or bottom of the stairs. If you do have throw rugs, attach them to the floor with carpet tape.  Make sure that you have a light switch at the top of the stairs and the bottom of the stairs. If you do not have them, ask someone to add them for you. What else can I do to help prevent falls?  Wear shoes  that:  Do not have high heels.  Have rubber bottoms.  Are comfortable and fit you well.  Are closed at the toe. Do not wear sandals.  If you use a stepladder:  Make sure that it is fully opened. Do not climb a closed stepladder.  Make sure that both sides of the stepladder are locked into place.  Ask someone to hold it for you, if possible.  Clearly mark and make sure that you can see:  Any grab bars or handrails.  First and last steps.  Where the edge of each step is.  Use tools that help you move around (mobility aids) if they are needed. These include:  Canes.  Walkers.  Scooters.  Crutches.  Turn on the lights when you go into a dark area. Replace any light bulbs as soon as they burn out.  Set up your furniture so you have a clear path. Avoid moving your furniture around.  If any of your floors are uneven, fix them.  If there are any pets around you, be aware of where they are.  Review your medicines with your doctor. Some medicines can make you feel dizzy. This can increase your chance of falling. Ask your doctor what other things that you can do to help prevent falls. This information is not intended to replace advice given to you by your health care provider. Make sure you discuss any questions you have with your health care provider. Document Released: 05/01/2009 Document Revised: 12/11/2015 Document Reviewed: 08/09/2014 Elsevier Interactive Patient Education  2017 [employer].

## 2018-09-11 NOTE — Progress Notes (Signed)
Subjective:   Monique Hall is a 78 y.o. female who presents for Medicare Annual (Subsequent) preventive examination.  Review of Systems:    Cardiac Risk Factors include: advanced age (>83men, >30 women);hypertension;dyslipidemia     Objective:     Vitals: BP 132/70 (BP Location: Left Arm, Patient Position: Sitting, Cuff Size: Normal)   Pulse 90   Temp 98 F (36.7 C) (Oral)   Resp 16   Ht 5\' 6"  (1.676 m)   Wt 171 lb 12.8 oz (77.9 kg)   SpO2 95%   BMI 27.73 kg/m   Body mass index is 27.73 kg/m.  Advanced Directives 09/11/2018 09/05/2017 09/02/2016 04/10/2016 08/29/2015 07/24/2015  Does Patient Have a Medical Advance Directive? No No No No No No  Would patient like information on creating a medical advance directive? No - Patient declined Yes (MAU/Ambulatory/Procedural Areas - Information given) - No - patient declined information No - patient declined information No - patient declined information    Tobacco Social History   Tobacco Use  Smoking Status Former Smoker  . Packs/day: 1.00  . Years: 35.00  . Pack years: 35.00  . Types: Cigarettes  . Last attempt to quit: 09/01/2002  . Years since quitting: 16.0  Smokeless Tobacco Never Used  Tobacco Comment   smoking cessation materials not required     Counseling given: Not Answered Comment: smoking cessation materials not required   Clinical Intake:  Pre-visit preparation completed: Yes  Pain : 0-10 Pain Score: 5  Pain Type: Acute pain Pain Location: (ear, head, neck) Pain Orientation: Right, Left, Posterior Pain Descriptors / Indicators: Aching, Discomfort Pain Onset: 1 to 4 weeks ago Pain Frequency: Intermittent     BMI - recorded: 27.73 Nutritional Status: BMI 25 -29 Overweight Nutritional Risks: None Diabetes: No  How often do you need to have someone help you when you read instructions, pamphlets, or other written materials from your doctor or pharmacy?: 1 - Never What is the last grade level you  completed in school?: 12th grade  Interpreter Needed?: No  Information entered by :: Clemetine Marker LPN  Past Medical History:  Diagnosis Date  . Allergy   . Emphysema lung ([place])   . Esophagitis   . Gastritis   . Hypercholesteremia   . Hypertension   . Lipoma of neck 04/27/2018  . Sciatica   . Tendonitis, Achilles 2016   both   . Thyroid nodule 2019   Past Surgical History:  Procedure Laterality Date  . ABDOMINAL HYSTERECTOMY  1985   total for bleeding  . APPENDECTOMY    . BIOPSY THYROID  2019  . BLADDER SUSPENSION  2004  . lipoma excision  03/2018   posterior neck   Family History  Problem Relation Age of Onset  . CAD Mother   . Diabetes Mother   . Parkinson's disease Father   . COPD Father   . Breast cancer Neg Hx    Social History   Socioeconomic History  . Marital status: Widowed    Spouse name: Not on file  . Number of children: 3  . Years of education: Not on file  . Highest education level: 12th grade  Occupational History  . Occupation: Retired  Scientific laboratory technician  . Financial resource strain: Not hard at all  . Food insecurity:    Worry: Never true    Inability: Never true  . Transportation needs:    Medical: No    Non-medical: No  Tobacco Use  . Smoking status:  Former Smoker    Packs/day: 1.00    Years: 35.00    Pack years: 35.00    Types: Cigarettes    Last attempt to quit: 09/01/2002    Years since quitting: 16.0  . Smokeless tobacco: Never Used  . Tobacco comment: smoking cessation materials not required  Substance and Sexual Activity  . Alcohol use: No    Alcohol/week: 0.0 standard drinks  . Drug use: No  . Sexual activity: Not Currently  Lifestyle  . Physical activity:    Days per week: 0 days    Minutes per session: 0 min  . Stress: Not at all  Relationships  . Social connections:    Talks on phone: More than three times a week    Gets together: More than three times a week    Attends religious service: More than 4 times per year      Active member of club or organization: No    Attends meetings of clubs or organizations: Never    Relationship status: Widowed  Other Topics Concern  . Not on file  Social History Narrative  . Not on file    Outpatient Encounter Medications as of 09/11/2018  Medication Sig  . Black Cohosh 200 MG CAPS Take 1 tablet by mouth daily. Reported on 08/20/2015  . Black Elderberry (SAMBUCUS ELDERBERRY PO) Take by mouth. Chew two gummies daily  . ezetimibe (ZETIA) 10 MG tablet TAKE 1 TABLET EVERY DAY  . loratadine (CLARITIN) 10 MG tablet Take 10 mg by mouth daily.  Marland Kitchen losartan-hydrochlorothiazide (HYZAAR) 100-12.5 MG tablet TAKE 1 TABLET EVERY DAY  . omeprazole (PRILOSEC) 20 MG capsule Take 20 mg by mouth as needed. Reported on 07/24/2015  . triamcinolone (NASACORT ALLERGY 24HR) 55 MCG/ACT AERO nasal inhaler Place 1 spray into the nose at bedtime. 1 spray each nostril QHS  . [DISCONTINUED] cetirizine (ZYRTEC) 10 MG tablet Take 10 mg by mouth daily. Reported on 08/20/2015   No facility-administered encounter medications on file as of 09/11/2018.     Activities of Daily Living In your present state of health, do you have any difficulty performing the following activities: 09/11/2018  Hearing? N  Comment declines hearing aids  Vision? N  Comment wears glasses  Difficulty concentrating or making decisions? N  Walking or climbing stairs? N  Dressing or bathing? N  Doing errands, shopping? N  Preparing Food and eating ? N  Using the Toilet? N  In the past six months, have you accidently leaked urine? N  Do you have problems with loss of bowel control? N  Managing your Medications? N  Managing your Finances? N  Housekeeping or managing your Housekeeping? N  Some recent data might be hidden    [name], MD as PCP - General (Family Medicine)    Assessment:   This is a routine wellness examination for [facility].  Exercise Activities and Dietary recommendations Current  Exercise Habits: The patient does not participate in regular exercise at present, Exercise limited by: None identified  Goals    . DIET - INCREASE WATER INTAKE     Recommend to drink at least 6-8 8oz glasses of water per day.       Fall Risk Fall Risk  09/11/2018 09/05/2017 09/02/2016 08/29/2015 01/22/2015  Falls in the past year? 0 No No No No  Number falls in past yr: 0 - - - -  Injury with Fall? 0 - - - -  Follow up Falls prevention discussed - - - -  FALL RISK PREVENTION PERTAINING TO THE HOME:  Any stairs in or around the home? Yes  If so, do they handrails? Yes   Home free of loose throw rugs in walkways, pet beds, electrical cords, etc? Yes  Adequate lighting in your home to reduce risk of falls? Yes   ASSISTIVE DEVICES UTILIZED TO PREVENT FALLS:  Life alert? No  Use of a cane, walker or w/c? No  Grab bars in the bathroom? Yes  Shower chair or bench in shower? Yes  Elevated toilet seat or a handicapped toilet? Yes   DME ORDERS:  DME order needed?  No   TIMED UP AND GO:  Was the test performed? Yes .  Length of time to ambulate 10 feet: 5 sec.   GAIT:  Appearance of gait: Gait stead-fast and without the use of an assistive device.    Education: Fall risk prevention has been discussed.  Intervention(s) required? No   Depression Screen PHQ 2/9 Scores 09/11/2018 09/05/2017 09/05/2017 09/02/2016  PHQ - 2 Score 0 0 0 0  PHQ- 9 Score - 0 - -     Cognitive Function     6CIT Screen 09/11/2018 09/05/2017 09/02/2016  What Year? 0 points 0 points 0 points  What month? 0 points 0 points 0 points  What time? 0 points 0 points 0 points  Count back from 20 0 points 0 points 0 points  Months in reverse 0 points 0 points 0 points  Repeat phrase 0 points 6 points 0 points  Total Score 0 6 0    Immunization History  Administered Date(s) Administered  . Influenza-Unspecified 04/22/2015, 04/28/2017, 05/11/2018  . Pneumococcal Conjugate-13 06/24/2014  . Pneumococcal  Polysaccharide-23 07/21/2011  . Tdap 07/20/2005, 08/29/2015  . Zoster 07/20/2000    Qualifies for Shingles Vaccine? Yes  Zostavax completed 2002. Due for Shingrix. Education has been provided regarding the importance of this vaccine. Pt has been advised to call insurance company to determine out of pocket expense. Advised may also receive vaccine at local pharmacy or Health Dept. Verbalized acceptance and understanding.  Tdap: Up to date  Flu Vaccine: Up to date  Pneumococcal Vaccine: Up to date   Screening Tests Health Maintenance  Topic Date Due  . MAMMOGRAM  09/12/2018  . TETANUS/TDAP  08/28/2025  . INFLUENZA VACCINE  Completed  . PNA vac Low Risk Adult  Completed  . DEXA SCAN  Addressed    Cancer Screenings:  Colorectal Screening: Completed 2012. No longer required.  Mammogram: Completed 09/12/17. Repeat every year. Scheduled for 09/14/18.  Bone Density: Completed 2010. Results reflect  OSTEOPENIA. Repeat every 2 years. Pt declined repeat screening at this time.   Lung Cancer Screening: (Low Dose CT Chest recommended if Age 1-80 years, 30 pack-year currently smoking OR have quit w/in 15years.) does qualify. Pt being managed by cancer center for thyroid nodule.   Additional Screening:  Hepatitis C Screening: no longer required  Vision Screening: Recommended annual ophthalmology exams for early detection of glaucoma and other disorders of the eye. Is the patient up to date with their annual eye exam?  Yes  Who is the provider or what is the name of the office in which the pt attends annual eye exams? Dr. Ellin Mayhew  Dental Screening: Recommended annual dental exams for proper oral hygiene  Community Resource Referral:  CRR required this visit?  No       Plan:    I have personally reviewed and addressed the Medicare Annual Wellness questionnaire and have  noted the following in the patient's chart:  A. Medical and social history B. Use of alcohol, tobacco or illicit  drugs  C. Current medications and supplements D. Functional ability and status E.  Nutritional status F.  Physical activity G. Advance directives H. List of other physicians I.  Hospitalizations, surgeries, and ER visits in previous 12 months J.  [place] such as hearing and vision if needed, cognitive and depression L. Referrals and appointments   In addition, I have reviewed and discussed with patient certain preventive protocols, quality metrics, and best practice recommendations. A written personalized care plan for preventive services as well as general preventive health recommendations were provided to patient.   Signed,  Clemetine Marker, LPN Nurse Health Advisor   Nurse Notes: pt c/o head congestion and ear pain. She has appt for physical with Dr. Army Melia on Friday 2/28 and plans to discuss at that visit. Sample of zyrtec and norel AD given for patient to try prior to next visit.

## 2018-09-13 ENCOUNTER — Ambulatory Visit: Payer: Medicare Other

## 2018-09-14 ENCOUNTER — Ambulatory Visit
Admission: RE | Admit: 2018-09-14 | Discharge: 2018-09-14 | Disposition: A | Discharge status: Home / Self Care | Payer: Medicare Other | Source: Ambulatory Visit | Attending: Internal Medicine | Admitting: Internal Medicine

## 2018-09-14 DIAGNOSIS — Z1231 Encounter for screening mammogram for malignant neoplasm of breast: Secondary | ICD-10-CM | POA: Diagnosis not present

## 2018-09-15 ENCOUNTER — Ambulatory Visit (INDEPENDENT_AMBULATORY_CARE_PROVIDER_SITE_OTHER): Payer: Medicare Other | Admitting: Internal Medicine

## 2018-09-15 ENCOUNTER — Encounter: Payer: Self-pay | Admitting: Internal Medicine

## 2018-09-15 ENCOUNTER — Other Ambulatory Visit: Payer: Self-pay

## 2018-09-15 VITALS — BP 142/80 | HR 93 | Resp 16 | Ht 66.0 in | Wt 173.0 lb

## 2018-09-15 DIAGNOSIS — I2510 Atherosclerotic heart disease of native coronary artery without angina pectoris: Secondary | ICD-10-CM | POA: Diagnosis not present

## 2018-09-15 DIAGNOSIS — I700 Atherosclerosis of aorta: Secondary | ICD-10-CM | POA: Diagnosis not present

## 2018-09-15 DIAGNOSIS — E782 Mixed hyperlipidemia: Secondary | ICD-10-CM

## 2018-09-15 DIAGNOSIS — J432 Centrilobular emphysema: Secondary | ICD-10-CM | POA: Insufficient documentation

## 2018-09-15 DIAGNOSIS — E041 Nontoxic single thyroid nodule: Secondary | ICD-10-CM

## 2018-09-15 DIAGNOSIS — K219 Gastro-esophageal reflux disease without esophagitis: Secondary | ICD-10-CM

## 2018-09-15 DIAGNOSIS — I10 Essential (primary) hypertension: Secondary | ICD-10-CM

## 2018-09-15 DIAGNOSIS — I25118 Atherosclerotic heart disease of native coronary artery with other forms of angina pectoris: Secondary | ICD-10-CM | POA: Diagnosis not present

## 2018-09-15 NOTE — Patient Instructions (Addendum)
Aspirin 81 mg daily - enteric coated will be easier on the stomach  Dermatology evaluation recommended

## 2018-09-15 NOTE — Progress Notes (Signed)
Date:  09/15/2018   Name:  Monique Hall   DOB:  1940/12/05   MRN:  [id number]   Chief Complaint: Annual Exam and Ear Pain (L and R ear pain x 3 weeks ) Monique Hall is a 78 y.o. female who presents today for her Complete Annual Exam. She feels fairly well. 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 She reports she is sleeping well.  She had her mammogram yesterday - normal.    Hypertension  This is a chronic problem. The problem is controlled. Pertinent negatives include no chest pain, headaches, palpitations or shortness of breath (on exertion). Risk factors for coronary artery disease include dyslipidemia. Past treatments include angiotensin blockers and diuretics. The current treatment provides significant improvement. Identifiable causes of hypertension include a thyroid problem.  Gastroesophageal Reflux  She complains of heartburn. She reports no chest pain, no coughing or no sore throat. This is a recurrent problem. The problem occurs rarely. Pertinent negatives include no fatigue. She has tried a PPI (omeprazole as needed only) for the symptoms.  Hyperlipidemia  This is a chronic problem. The problem is resistant. Pertinent negatives include no chest pain or shortness of breath (on exertion). Current antihyperlipidemic treatment includes ezetimibe. The current treatment provides mild improvement of lipids. Compliance problems include medication side effects.   Thyroid Problem  Presents for follow-up visit. Patient reports no diarrhea, fatigue, palpitations or tremors. The symptoms have been stable (followed by Dr. Kathyrn Sheriff; had bx with repeat US scheduled). Her past medical history is significant for hyperlipidemia.  Otalgia   There is pain in both ears. This is a recurrent problem. The patient is experiencing no pain (just fullness and pressure). Pertinent negatives include no coughing, diarrhea, ear discharge, headaches, rhinorrhea or sore throat.  Aortic atherosclerosis - has not  been able to take several statins, only on zetia.  Findings from CT last year.  CAD - seen on ct scan last year.  She denies chest pain or pressure but she does get winded quickly if she climbs stairs or has to rush somewhere.  Her last cardiology evaluation was with Dr. Ubaldo Glassing years ago - it sounds like she has a treadmill stress test only.  Lab Results  Component Value Date   CREATININE 0.84 09/08/2017   BUN 16 09/08/2017   NA 141 09/08/2017   K 4.5 09/08/2017   CL 100 09/08/2017   CO2 20 09/08/2017   Lab Results  Component Value Date   CHOL 295 (H) 09/08/2017   HDL 57 09/08/2017   LDLCALC 206 (H) 09/08/2017   TRIG 160 (H) 09/08/2017   CHOLHDL 5.2 (H) 09/08/2017     Review of Systems  Constitutional: Negative for chills, fatigue, fever and unexpected weight change.  HENT: Positive for congestion and ear pain. Negative for ear discharge, rhinorrhea, sore throat and trouble swallowing.   Eyes: Negative for visual disturbance.  Respiratory: Positive for chest tightness. Negative for cough and shortness of breath (on exertion).   Cardiovascular: Negative for chest pain, palpitations and leg swelling.  Gastrointestinal: Positive for heartburn. Negative for diarrhea.  Musculoskeletal: Positive for arthralgias and back pain.  Allergic/Immunologic: Negative for environmental allergies.  Neurological: Negative for dizziness, tremors, weakness and headaches.  Hematological: Negative for adenopathy.  Psychiatric/Behavioral: Negative for dysphoric mood and sleep disturbance.    Patient Active Problem List   Diagnosis Date Noted  . Aortic atherosclerosis ([place]) 09/15/2018  . Centrilobular emphysema ([place]) 09/15/2018  . Coronary artery disease 09/15/2018  .  Thyroid nodule 10/06/2017  . Gastroesophageal reflux disease without esophagitis 09/02/2016  . Essential hypertension 07/29/2015  . Mixed hyperlipidemia 07/29/2015  . Menopausal symptoms 07/29/2015  . Tobacco use disorder, moderate,  in sustained remission 07/29/2015  . Degeneration of intervertebral disc of lumbar region 06/17/2015  . 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 Sacroiliac strain 01/28/2015    Allergies  Allergen Reactions  . Baclofen     Other reaction(s): Unknown  . Etodolac Nausea Only    Past Surgical History:  Procedure Laterality Date  . ABDOMINAL HYSTERECTOMY  1985   total for bleeding  . APPENDECTOMY    . BIOPSY THYROID  2019  . BLADDER SUSPENSION  2004  . lipoma excision  03/2018   posterior neck    Social History   Tobacco Use  . Smoking status: Former Smoker    Packs/day: 1.00    Years: 35.00    Pack years: 35.00    Types: Cigarettes    Last attempt to quit: 09/01/2002    Years since quitting: 16.0  . Smokeless tobacco: Never Used  . Tobacco comment: smoking cessation materials not required  Substance Use Topics  . Alcohol use: No    Alcohol/week: 0.0 standard drinks  . Drug use: No     Medication list has been reviewed and updated.  Current Meds  Medication Sig  . Black Cohosh 200 MG CAPS Take 1 tablet by mouth daily. Reported on 08/20/2015  . Black Elderberry (SAMBUCUS ELDERBERRY PO) Take by mouth. Chew two gummies daily  . ezetimibe (ZETIA) 10 MG tablet TAKE 1 TABLET EVERY DAY  . loratadine (CLARITIN) 10 MG tablet Take 10 mg by mouth daily.  Marland Kitchen losartan-hydrochlorothiazide (HYZAAR) 100-12.5 MG tablet TAKE 1 TABLET EVERY DAY  . omeprazole (PRILOSEC) 20 MG capsule Take 20 mg by mouth as needed. Reported on 07/24/2015  . triamcinolone (NASACORT ALLERGY 24HR) 55 MCG/ACT AERO nasal inhaler Place 1 spray into the nose at bedtime. 1 spray each nostril QHS    PHQ 2/9 Scores 09/15/2018 09/11/2018 09/05/2017 09/05/2017  PHQ - 2 Score 0 0 0 0  PHQ- 9 Score 0 - 0 -    Physical Exam Vitals signs and nursing note reviewed.  Constitutional:      General: She is not in acute distress.    Appearance: She is well-developed.  HENT:      Head: Normocephalic and atraumatic.     Right Ear: Tympanic membrane and ear canal normal.     Left Ear: Tympanic membrane and ear canal normal.     Nose:     Right Sinus: No maxillary sinus tenderness.     Left Sinus: No maxillary sinus tenderness.     Mouth/Throat:     Pharynx: Uvula midline.  Eyes:     General: No scleral icterus.       Right eye: No discharge.        Left eye: No discharge.     Conjunctiva/sclera: Conjunctivae normal.  Neck:     Musculoskeletal: Normal range of motion. No erythema.     Thyroid: No thyromegaly.     Vascular: No carotid bruit.  Cardiovascular:     Rate and Rhythm: Normal rate and regular rhythm.     Pulses: Normal pulses.     Heart sounds: Normal heart sounds.  Pulmonary:     Effort: Pulmonary effort is normal. No respiratory distress.     Breath sounds: No wheezing.  Chest:  Breasts:        Right: No mass, nipple discharge, skin change or tenderness.        Left: No mass, nipple discharge, skin change or tenderness.  Abdominal:     General: Bowel sounds are normal.     Palpations: Abdomen is soft.     Tenderness: There is no abdominal tenderness.  Musculoskeletal:     Right knee: She exhibits decreased range of motion (mild crepitus). She exhibits no swelling and no effusion.     Right lower leg: No edema.     Left lower leg: No edema.  Lymphadenopathy:     Cervical: No cervical adenopathy.  Skin:    General: Skin is warm and dry.     Findings: No rash.  Neurological:     Mental Status: She is alert and oriented to person, place, and time.     Cranial Nerves: No cranial nerve deficit.     Sensory: No sensory deficit.     Deep Tendon Reflexes: Reflexes are normal and symmetric.  Psychiatric:        Attention and Perception: Attention normal.        Mood and Affect: Mood normal.        Speech: Speech normal.        Behavior: Behavior normal.        Thought Content: Thought content normal.     BP (!) 142/80   Pulse 93    Resp 16   Ht 5\' 6"  (1.676 m)   Wt 173 lb (78.5 kg)   SpO2 95%   BMI 27.92 kg/m   Assessment and Plan: 1. Essential hypertension Fair control - Comprehensive metabolic panel  2. Gastroesophageal reflux disease without esophagitis Stable, rarely uses PPI now - CBC with Differential/Platelet  3. Mixed hyperlipidemia On zetia with minimal benefit - Lipid panel  4. Thyroid nodule Being followed by ENT - TSH  5. Aortic atherosclerosis ([place]) - Ambulatory referral to Cardiology  6. Coronary artery disease involving native coronary artery of native heart with other form of angina pectoris ([place]) Concern that SOB is anginal Begin ASA 81 mg daily Refer for further evaluation and risk assessment - Lipid panel - Ambulatory referral to Cardiology  7. Centrilobular emphysema ([place]) From previous tobacco use She is a candidate for one more LDCT screening this year   Partially dictated using Editor, commissioning. Any errors are unintentional.  Halina Maidens, MD [facility]  09/15/2018

## 2018-09-16 LAB — CBC WITH DIFFERENTIAL/PLATELET
BASOS ABS: 0.1 10*3/uL (ref 0.0–0.2)
BASOS: 1 %
EOS (ABSOLUTE): 0.2 10*3/uL (ref 0.0–0.4)
Eos: 3 %
Hematocrit: 39.4 % (ref 34.0–46.6)
Hemoglobin: 13.3 g/dL (ref 11.1–15.9)
Immature Grans (Abs): 0 10*3/uL (ref 0.0–0.1)
Immature Granulocytes: 0 %
LYMPHS ABS: 3.6 10*3/uL — AB (ref 0.7–3.1)
Lymphs: 51 %
MCH: 30.5 pg (ref 26.6–33.0)
MCHC: 33.8 g/dL (ref 31.5–35.7)
MCV: 90 fL (ref 79–97)
MONOS ABS: 0.7 10*3/uL (ref 0.1–0.9)
Monocytes: 10 %
NEUTROS ABS: 2.4 10*3/uL (ref 1.4–7.0)
Neutrophils: 35 %
PLATELETS: 392 10*3/uL (ref 150–450)
RBC: 4.36 x10E6/uL (ref 3.77–5.28)
RDW: 13.3 % (ref 11.7–15.4)
WBC: 7 10*3/uL (ref 3.4–10.8)

## 2018-09-16 LAB — LIPID PANEL
CHOLESTEROL TOTAL: 264 mg/dL — AB (ref 100–199)
Chol/HDL Ratio: 5 ratio — ABNORMAL HIGH (ref 0.0–4.4)
HDL: 53 mg/dL (ref >39)
LDL Calculated: 165 mg/dL — ABNORMAL HIGH (ref 0–99)
Triglycerides: 230 mg/dL — ABNORMAL HIGH (ref 0–149)
VLDL CHOLESTEROL CAL: 46 mg/dL — AB (ref 5–40)

## 2018-09-16 LAB — COMPREHENSIVE METABOLIC PANEL
A/G RATIO: 1.5 (ref 1.2–2.2)
ALK PHOS: 52 IU/L (ref 39–117)
ALT: 15 IU/L (ref 0–32)
AST: 20 IU/L (ref 0–40)
Albumin: 4.4 g/dL (ref 3.7–4.7)
BUN / CREAT RATIO: 16 (ref 12–28)
BUN: 14 mg/dL (ref 8–27)
Bilirubin Total: 0.7 mg/dL (ref 0.0–1.2)
CO2: 22 mmol/L (ref 20–29)
Calcium: 9.9 mg/dL (ref 8.7–10.3)
Chloride: 102 mmol/L (ref 96–106)
Creatinine, Ser: 0.88 mg/dL (ref 0.57–1.00)
GFR calc non Af Amer: 64 mL/min/{1.73_m2} (ref >59)
GFR, EST AFRICAN AMERICAN: 73 mL/min/{1.73_m2} (ref >59)
GLUCOSE: 95 mg/dL (ref 65–99)
Globulin, Total: 2.9 g/dL (ref 1.5–4.5)
POTASSIUM: 4.6 mmol/L (ref 3.5–5.2)
Sodium: 139 mmol/L (ref 134–144)
TOTAL PROTEIN: 7.3 g/dL (ref 6.0–8.5)

## 2018-09-16 LAB — TSH: TSH: 2.1 u[IU]/mL (ref 0.450–4.500)

## 2018-09-19 DIAGNOSIS — I700 Atherosclerosis of aorta: Secondary | ICD-10-CM | POA: Diagnosis not present

## 2018-09-19 DIAGNOSIS — I2510 Atherosclerotic heart disease of native coronary artery without angina pectoris: Secondary | ICD-10-CM | POA: Diagnosis not present

## 2018-09-19 DIAGNOSIS — I10 Essential (primary) hypertension: Secondary | ICD-10-CM | POA: Diagnosis not present

## 2018-09-19 DIAGNOSIS — R0602 Shortness of breath: Secondary | ICD-10-CM | POA: Diagnosis not present

## 2018-09-19 DIAGNOSIS — E782 Mixed hyperlipidemia: Secondary | ICD-10-CM | POA: Diagnosis not present

## 2018-09-21 ENCOUNTER — Encounter: Payer: Self-pay | Admitting: *Deleted

## 2018-09-22 ENCOUNTER — Telehealth: Payer: Self-pay

## 2018-09-22 NOTE — Telephone Encounter (Signed)
Call pt regarding lung screening. Pt is a former smoker. Pt would like scan to be in the a.m on a Thursday or Friday only. Pt is have echo and stress test on March 27 th. Pt daughter is having test on 31 st. Any other day will work. Pt denies any other health issues.

## 2018-09-26 ENCOUNTER — Telehealth: Payer: Self-pay | Admitting: *Deleted

## 2018-09-26 ENCOUNTER — Other Ambulatory Visit: Payer: Self-pay | Admitting: Oncology

## 2018-09-26 DIAGNOSIS — Z87891 Personal history of nicotine dependence: Secondary | ICD-10-CM

## 2018-09-26 DIAGNOSIS — R911 Solitary pulmonary nodule: Secondary | ICD-10-CM

## 2018-09-26 NOTE — Telephone Encounter (Signed)
Sounds great!  Faythe Casa, NP 09/26/2018 2:17 PM

## 2018-09-26 NOTE — Telephone Encounter (Signed)
Please note, patient is referred to and will be followed by lung nodule program, not lung cancer screening program.

## 2018-09-26 NOTE — Telephone Encounter (Signed)
Patient is notified that she was contacted in error and is not a candidate for lung cancer screening due to quit date >15 years. Encouraged to call for any questions.

## 2018-09-26 NOTE — Progress Notes (Signed)
  Pulmonary Nodule Clinic Telephone Note  Received referral from PCP, Dr. Derrel Nip.   Per most recent guidelines and recommendations from [place] (2017), this patient requires a CT scan without contrast, 12 months from last scan and follow-up visit in the pulmonary nodule clinic a few days later.    I have personally reviewed all patient's previous imaging. Last CT chest lung cancer screening scan completed on 09/29/2017 revealed several tiny bilateral pulmonary nodules measuring up to maximum volume equivalent in diameter to 2.7 mm.  No previous imaging available for review. Per Fleischner guidelines (2017), scan without contrast is recommended in 12 months and if stable repeat CT scan at 18 to 24 months (from first scan) to ensure stability in high risk patients.  Unfortunately, patient does not meet low-dose lung screening program guidelines given she has quit smoking greater than 15 years.  The pulmonary lung nodule clinic would be happy to take over routine imaging.  High risk factors include: History of heavy smoking, exposure to asbestos, radium or uranium, personal family history of lung cancer, older age, sex (females greater than males), race (black and native [place] greater than weight), marginal speculation, upper lobe location, multiplicity (less than 5 nodules increases risk for malignancy) and emphysema and/or pulmonary fibrosis.   This recommendation follows the consensus statement: Guidelines for Management of Incidental Pulmonary Nodules Detected on CT Images: From the Fleischner Society 2017; Radiology 2017; 284:228-243.    I have placed order for CT scan without contrast to be completed in the next several days.  I would like her to see me in our Pulmonary Nodule Clinic after her CT scan on scheduled clinic day (Friday Morning).   Scheduling has been notified of appointment request and will call patient with appointment   Faythe Casa, NP 08/30/2018 2:22  PM

## 2018-09-26 NOTE — Telephone Encounter (Signed)
Contacted patient and discussed patient not eligible for continued lung cancer screening due to quit date. However, stressed the importance of follow up for small lung nodule seen on last years CT scan. Patient is agreeable with recommendation to be followed in lung cancer screening program with timing of scan after 10/17/18 due to patient's daughters health appointments.

## 2018-10-24 ENCOUNTER — Ambulatory Visit: Admission: RE | Admit: 2018-10-24 | Payer: Medicare Other | Source: Ambulatory Visit

## 2018-10-24 ENCOUNTER — Ambulatory Visit: Payer: Medicare Other | Admitting: Oncology

## 2018-11-05 DIAGNOSIS — R55 Syncope and collapse: Secondary | ICD-10-CM | POA: Diagnosis not present

## 2018-11-05 DIAGNOSIS — T679XXA Effect of heat and light, unspecified, initial encounter: Secondary | ICD-10-CM | POA: Diagnosis not present

## 2018-11-09 DIAGNOSIS — I2510 Atherosclerotic heart disease of native coronary artery without angina pectoris: Secondary | ICD-10-CM | POA: Diagnosis not present

## 2018-11-09 DIAGNOSIS — R0602 Shortness of breath: Secondary | ICD-10-CM | POA: Diagnosis not present

## 2018-11-16 DIAGNOSIS — I2510 Atherosclerotic heart disease of native coronary artery without angina pectoris: Secondary | ICD-10-CM | POA: Diagnosis not present

## 2018-11-16 DIAGNOSIS — I10 Essential (primary) hypertension: Secondary | ICD-10-CM | POA: Diagnosis not present

## 2018-11-16 DIAGNOSIS — E782 Mixed hyperlipidemia: Secondary | ICD-10-CM | POA: Diagnosis not present

## 2018-11-16 DIAGNOSIS — I700 Atherosclerosis of aorta: Secondary | ICD-10-CM | POA: Diagnosis not present

## 2018-11-20 DIAGNOSIS — R42 Dizziness and giddiness: Secondary | ICD-10-CM | POA: Diagnosis not present

## 2018-11-20 DIAGNOSIS — H6123 Impacted cerumen, bilateral: Secondary | ICD-10-CM | POA: Diagnosis not present

## 2018-11-20 DIAGNOSIS — H903 Sensorineural hearing loss, bilateral: Secondary | ICD-10-CM | POA: Diagnosis not present

## 2018-11-23 ENCOUNTER — Other Ambulatory Visit: Payer: Self-pay

## 2018-11-24 ENCOUNTER — Ambulatory Visit
Admission: RE | Admit: 2018-11-24 | Discharge: 2018-11-24 | Disposition: A | Discharge status: Home / Self Care | Payer: Medicare Other | Source: Ambulatory Visit | Attending: Oncology | Admitting: Oncology

## 2018-11-24 ENCOUNTER — Ambulatory Visit
Admission: RE | Admit: 2018-11-24 | Discharge: 2018-11-24 | Disposition: A | Discharge status: Home / Self Care | Payer: Medicare Other | Source: Ambulatory Visit | Attending: Otolaryngology | Admitting: Otolaryngology

## 2018-11-24 ENCOUNTER — Inpatient Hospital Stay: Payer: Medicare Other | Attending: Oncology | Admitting: Oncology

## 2018-11-24 DIAGNOSIS — R911 Solitary pulmonary nodule: Secondary | ICD-10-CM | POA: Diagnosis not present

## 2018-11-24 DIAGNOSIS — E041 Nontoxic single thyroid nodule: Secondary | ICD-10-CM | POA: Insufficient documentation

## 2018-11-24 DIAGNOSIS — Z87891 Personal history of nicotine dependence: Secondary | ICD-10-CM | POA: Diagnosis not present

## 2018-11-24 DIAGNOSIS — I25118 Atherosclerotic heart disease of native coronary artery with other forms of angina pectoris: Secondary | ICD-10-CM

## 2018-11-24 DIAGNOSIS — E042 Nontoxic multinodular goiter: Secondary | ICD-10-CM | POA: Diagnosis not present

## 2018-11-27 ENCOUNTER — Other Ambulatory Visit: Payer: Self-pay | Admitting: Oncology

## 2018-11-27 DIAGNOSIS — R911 Solitary pulmonary nodule: Secondary | ICD-10-CM

## 2018-11-27 NOTE — Progress Notes (Signed)
Pulmonary Nodule Clinic Consult note [facility]  [phone] [phone]  [name], MD as PCP - General (Family Medicine)   Name of the patient: Monique Hall  [id number]  February 08, 1941   Date of visit: 11/24/2018   I connected with Monique Hall on 11/24/18 at  2:30 PM EDT by telephone visit and verified that I am speaking with the correct person using two identifiers.   I discussed the limitations, risks, security and privacy concerns of performing an evaluation and management service by telemedicine and the availability of in-person appointments. I also discussed with the patient that there may be a patient responsible charge related to this service. The patient expressed understanding and agreed to proceed.   Other persons participating in the visit and their role in the encounter: None  Patients location: Home Providers location: Home  Diagnosis- Lung Nodule  Chief complaint/ Reason for visit- Pulmonary Nodule Clinic Initial Visit  Past Medical History:  Patient is managed/referred from LDCT screening program. She does not meet criteria for the LDCT screening program at this time.  Lung screening from 09/29/2017 revealed tiny bilateral pulmonary nodules measuring up to a maximum volume of 2.7 mm.  Continued annual screening recommended.  Interval history-Monique Hall presents to the lung nodule clinic by telephone for assessment and results from recent CT scan.  Patient was referred by PCP initially to lung screening program by Dr. Army Melia.  Based on low-dose CT screening guidelines she has quit smoking greater than 15 years ago which makes her ineligible for continued LDCT screening.  Given she has persistent bilateral pulmonary nodules she meets criteria for the pulmonary nodule clinic.  Per medical record review, patient has past medical history of hypertension, GERD, hyperlipidemia, thyroid problem, otalgia,  aortic arthrosclerosis and CAD.  She currently does not smoke cigarettes.  Quit date greater than 15 years ago.  She is retired.   She is widowed.  She does not have any children.   She denies any occupational exposure.   Familycancerhistory include: Mother has history of CAD and diabetes. Father has history of Parkinson's disease. No history of cancer per patient.  During the interim, patient has been doing well.  She denies any respiratory concerns at this time.  She has chronic back pain.  She has had physical therapy and steroid injections with some relief. Based on lung screening risk assessment, patient is atHIGH risk for the development of lung cancer.She deniesany concerns at this time.Denies any neurologic complaints. Denies recent fevers or illnesses. Denies any easy bleeding or bruising. Reports good appetite and denies weight loss. Denies chest pain. Denies any nausea, vomiting, constipation, or diarrhea. Denies urinary complaints.   ECOG FS:1 - Symptomatic but completely ambulatory  Review of systems- Review of Systems  Constitutional: Negative.  Negative for chills, fever, malaise/fatigue and weight loss.  HENT: Negative for congestion, ear pain and tinnitus.   Eyes: Negative.  Negative for blurred vision and double vision.  Respiratory: Negative.  Negative for cough, sputum production and shortness of breath.   Cardiovascular: Negative.  Negative for chest pain, palpitations and leg swelling.  Gastrointestinal: Negative.  Negative for abdominal pain, constipation, diarrhea, nausea and vomiting.  Genitourinary: Negative for dysuria, frequency and urgency.  Musculoskeletal: Positive for back pain. Negative for falls.  Skin: Negative.  Negative for rash.  Neurological: Negative.  Negative for weakness and headaches.  Endo/Heme/Allergies: Negative.  Does not bruise/bleed easily.  Psychiatric/Behavioral: Negative.  Negative for  depression. The patient is not  nervous/anxious and does not have insomnia.      Allergies  Allergen Reactions   Baclofen     Other reaction(s): Unknown   Etodolac Nausea Only     Past Medical History:  Diagnosis Date   Allergy    Emphysema lung ([place])    Esophagitis    Gastritis    Hypercholesteremia    Hypertension    Lipoma of neck 04/27/2018   Sciatica    Tendonitis, Achilles 2016   both    Thyroid nodule 2019     Past Surgical History:  Procedure Laterality Date   ABDOMINAL HYSTERECTOMY  1985   total for bleeding   APPENDECTOMY     BIOPSY THYROID  2019   BLADDER SUSPENSION  2004   lipoma excision  03/2018   posterior neck    Social History   Socioeconomic History   Marital status: Widowed    Spouse name: Not on file   Number of children: 3   Years of education: Not on file   Highest education level: 12th grade  Occupational History   Occupation: Retired  Scientist, product/process development strain: Not hard at [employer] insecurity    Worry: Never true    Inability: Never true   Transportation needs    Medical: No    Non-medical: No  Tobacco Use   Smoking status: Former Smoker    Packs/day: 1.00    Years: 35.00    Pack years: 35.00    Types: Cigarettes    Quit date: 09/01/2002    Years since quitting: 16.5   Smokeless tobacco: Never Used   Tobacco comment: smoking cessation materials not required  Substance and Sexual Activity   Alcohol use: No    Alcohol/week: 0.0 standard drinks   Drug use: No   Sexual activity: Not Currently  Lifestyle   Physical activity    Days per week: 0 days    Minutes per session: 0 min   Stress: Not at all  Relationships   Social connections    Talks on phone: More than three times a week    Gets together: More than three times a week    Attends religious service: More than 4 times per year    Active member of club or organization: No    Attends meetings of clubs or organizations: Never    Relationship  status: Widowed   Intimate partner violence    Fear of current or ex partner: No    Emotionally abused: No    Physically abused: No    Forced sexual activity: No  Other Topics Concern   Not on file  Social History Narrative   Not on file    Family History  Problem Relation Age of Onset   CAD Mother    Diabetes Mother    Parkinson's disease Father    COPD Father    Breast cancer Neg Hx      Current Outpatient Medications:    Black Cohosh 200 MG CAPS, Take 1 tablet by mouth daily. Reported on 08/20/2015, Disp: , Rfl:    Black Elderberry (SAMBUCUS ELDERBERRY PO), Take by mouth. Chew two gummies daily, Disp: , Rfl:    ezetimibe (ZETIA) 10 MG tablet, TAKE 1 TABLET EVERY DAY, Disp: 90 tablet, Rfl: 3   loratadine (CLARITIN) 10 MG tablet, Take 10 mg by mouth daily., Disp: , Rfl:    losartan-hydrochlorothiazide (HYZAAR) 100-12.5 MG tablet, TAKE 1 TABLET  EVERY DAY, Disp: 90 tablet, Rfl: 3   omeprazole (PRILOSEC) 20 MG capsule, Take 20 mg by mouth as needed. Reported on 07/24/2015, Disp: , Rfl:    triamcinolone (NASACORT ALLERGY 24HR) 55 MCG/ACT AERO nasal inhaler, Place 1 spray into the nose at bedtime. 1 spray each nostril QHS, Disp: , Rfl:   Physical exam: There were no vitals filed for this visit.  Limited d/t telephone visit.     CMP Latest Ref Rng & Units 09/15/2018  Glucose 65 - 99 mg/dL 95  BUN 8 - 27 mg/dL 14  Creatinine 0.57 - 1.00 mg/dL 0.88  Sodium 134 - 144 mmol/L 139  Potassium 3.5 - 5.2 mmol/L 4.6  Chloride 96 - 106 mmol/L 102  CO2 20 - 29 mmol/L 22  Calcium 8.7 - 10.3 mg/dL 9.9  Total Protein 6.0 - 8.5 g/dL 7.3  Total Bilirubin 0.0 - 1.2 mg/dL 0.7  Alkaline Phos 39 - 117 IU/L 52  AST 0 - 40 IU/L 20  ALT 0 - 32 IU/L 15   CBC Latest Ref Rng & Units 09/15/2018  WBC 3.4 - 10.8 x10E3/uL 7.0  Hemoglobin 11.1 - 15.9 g/dL 13.3  Hematocrit 34.0 - 46.6 % 39.4  Platelets 150 - 450 x10E3/uL 392    No images are attached to the encounter.  No results  found.  Assessment and plan- Patient is a 78 y.o. female who presents to pulmonary nodule clinic for assessment and review of recent CT scan.   CT scan without contrast revealed scattered stable small sub-solid pulmonary nodules.  Nodules measure up to approximately 6 mm.  Repeat CT is recommended every 2 years until 5 years of stability has been established per Fleischner guidelines.  Compared low-dose CT screening (09/29/17) to current revealing an increase of 2.7 mm to approximately 6 mm lung nodule .  Given, lung nodules have increased in size, characterized as sub-solid and the location of several nodules(upper lobes), would recommend repeat imaging in 1 year versus 2 years.   She has a 6 mm sub-solid nodule in the left upper lobe, 4 mm sub-solid nodule in apex segment of the right lower lobe and scattered 5 mm sub-solid nodules to left lower lobe.  Calculating malignancy probability of a pulmonary nodule: Risk factors include: 1. Age. 2. Cancer history. 3. Diameter of pulmonary nodule and mm 4. Location 5. Smoking history 6. Spiculation present  Based on risk factors, this patient is high risk for the development of lung cancer.    I recommend she return to clinic in approximately 1 year for CT scan without contrast.  If stable at that time would recommend every 2-year follow-up until 5 years of stability is noted.  Patient in agreement with plan.  During our visit, we discussed pulmonary nodules are a common incidental finding and are often how lung cancer is discovered. Lung cancer survival is directly related to the stage at diagnosis. We discussed that nodules can vary in presentation from solitary pulmonary nodules to masses, 2 groundglass opacities and multiple nodules. Pulmonary nodules in the majority of cases are benign but the probability of these becoming malignant cannot be undermined. Early identification of malignant nodules could lead to early diagnosis and  increased survival.  We discussed the probability of pulmonary nodules becoming malignant increase with age, pack years of tobacco use, size/characteristics of the nodule and location; with upper lobe involvement being most worrisome.  We discussed the goal of our clinic is to thoroughly evaluate each nodule, developed a comprehensive,  individualized plan of care utilizing the most advanced technology and significantly reduce the time from detection to treatment.A dedicated pulmonary nodule clinic has proven to indeed expedite the detection and treatment of lung cancer.  Patient education in fact sheet provided along with most recent CT scans.  Disposition:  Return to clinic in approximately 1 year for repeat imaging with CT chest without contrast. Next scan is scheduled for 12/04/2019.   Visit Diagnosis 1. Personal history of tobacco use, presenting hazards to health    Patient expressed understanding and was in agreement with this plan. She also understands that She can call clinic at any time with any questions, concerns, or complaints.   I provided 25 minutes of non-face-to-face time during this encounter.;  Greater than 50% was spent in counseling and coordination of care with this patient including but not limited to discussion of the relevant topics above (See A&P) including, but not limited to diagnosis and management of acute and chronic medical conditions.   Thank you for allowing me to participate in the care of this very pleasant patient.   Jacquelin Hawking, NP [place] [facility] Cell - [phone] Pager- [phone] 03/13/2019 12:09 PM

## 2018-11-27 NOTE — Progress Notes (Signed)
Orders placed for Ct chest wo contrast in one year. Scheduled at [facility].   Faythe Casa, NP 11/27/2018 11:38 AM

## 2018-11-30 DIAGNOSIS — R42 Dizziness and giddiness: Secondary | ICD-10-CM | POA: Diagnosis not present

## 2018-11-30 DIAGNOSIS — E041 Nontoxic single thyroid nodule: Secondary | ICD-10-CM | POA: Diagnosis not present

## 2018-11-30 DIAGNOSIS — H90A21 Sensorineural hearing loss, unilateral, right ear, with restricted hearing on the contralateral side: Secondary | ICD-10-CM | POA: Diagnosis not present

## 2018-11-30 DIAGNOSIS — H903 Sensorineural hearing loss, bilateral: Secondary | ICD-10-CM | POA: Diagnosis not present

## 2018-12-01 DIAGNOSIS — H8111 Benign paroxysmal vertigo, right ear: Secondary | ICD-10-CM | POA: Diagnosis not present

## 2019-01-25 DIAGNOSIS — H401431 Capsular glaucoma with pseudoexfoliation of lens, bilateral, mild stage: Secondary | ICD-10-CM | POA: Diagnosis not present

## 2019-01-25 DIAGNOSIS — H2513 Age-related nuclear cataract, bilateral: Secondary | ICD-10-CM | POA: Diagnosis not present

## 2019-02-16 DIAGNOSIS — I700 Atherosclerosis of aorta: Secondary | ICD-10-CM | POA: Diagnosis not present

## 2019-02-16 DIAGNOSIS — F17201 Nicotine dependence, unspecified, in remission: Secondary | ICD-10-CM | POA: Diagnosis not present

## 2019-02-16 DIAGNOSIS — J432 Centrilobular emphysema: Secondary | ICD-10-CM | POA: Diagnosis not present

## 2019-02-16 DIAGNOSIS — I2510 Atherosclerotic heart disease of native coronary artery without angina pectoris: Secondary | ICD-10-CM | POA: Diagnosis not present

## 2019-02-16 DIAGNOSIS — E782 Mixed hyperlipidemia: Secondary | ICD-10-CM | POA: Diagnosis not present

## 2019-02-16 DIAGNOSIS — I10 Essential (primary) hypertension: Secondary | ICD-10-CM | POA: Diagnosis not present

## 2019-03-20 ENCOUNTER — Telehealth: Payer: Self-pay | Admitting: *Deleted

## 2019-03-20 NOTE — Telephone Encounter (Signed)
Pt has been made aware of upcoming appts for follow up CT scan and follow up appt with Jennifer Burns, NP in the Lung Nodule Clinic. Pt verbalized understanding. Nothing further needed at this time. 

## 2019-05-11 DIAGNOSIS — Z23 Encounter for immunization: Secondary | ICD-10-CM | POA: Diagnosis not present

## 2019-06-01 DIAGNOSIS — M5416 Radiculopathy, lumbar region: Secondary | ICD-10-CM | POA: Diagnosis not present

## 2019-06-01 DIAGNOSIS — M48062 Spinal stenosis, lumbar region with neurogenic claudication: Secondary | ICD-10-CM | POA: Diagnosis not present

## 2019-06-01 DIAGNOSIS — M5136 Other intervertebral disc degeneration, lumbar region: Secondary | ICD-10-CM | POA: Diagnosis not present

## 2019-06-08 DIAGNOSIS — M5136 Other intervertebral disc degeneration, lumbar region: Secondary | ICD-10-CM | POA: Diagnosis not present

## 2019-06-08 DIAGNOSIS — M48062 Spinal stenosis, lumbar region with neurogenic claudication: Secondary | ICD-10-CM | POA: Diagnosis not present

## 2019-06-08 DIAGNOSIS — M5416 Radiculopathy, lumbar region: Secondary | ICD-10-CM | POA: Diagnosis not present

## 2019-07-17 ENCOUNTER — Ambulatory Visit: Payer: Medicare Other | Attending: Internal Medicine

## 2019-07-17 DIAGNOSIS — Z20822 Contact with and (suspected) exposure to covid-19: Secondary | ICD-10-CM

## 2019-07-18 LAB — NOVEL CORONAVIRUS, NAA: SARS-CoV-2, NAA: DETECTED — AB

## 2019-07-19 ENCOUNTER — Telehealth: Payer: Self-pay | Admitting: Nurse Practitioner

## 2019-07-19 NOTE — Telephone Encounter (Signed)
Called to Discuss with patient about Covid symptoms and the use of bamlanivimab, a monoclonal antibody infusion for those with mild to moderate Covid symptoms and at a high risk of hospitalization.     Pt is qualified for this infusion at the [facility] infusion center due to co-morbid conditions and/or a member of an at-risk group.     Patient Active Problem List   Diagnosis Date Noted  . Aortic atherosclerosis ([place]) 09/15/2018  . Centrilobular emphysema ([place]) 09/15/2018  . Coronary artery disease 09/15/2018  . Thyroid nodule 10/06/2017  . Gastroesophageal reflux disease without esophagitis 09/02/2016  . Essential hypertension 07/29/2015  . Mixed hyperlipidemia 07/29/2015  . Menopausal symptoms 07/29/2015  . Tobacco use disorder, moderate, in sustained remission 07/29/2015  . Degeneration of intervertebral disc of lumbar region 06/17/2015  . 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 Sacroiliac strain 01/28/2015    Patient declines infusion at this time. Symptoms tier reviewed as well as criteria for ending isolation. Preventative practices reviewed. Patient verbalized understanding.    Patient advised to call back if he decides that he does want to get infusion. Callback number to the infusion center given. Patient advised to go to Urgent care or ED with severe symptoms.   Symptoms started 07/14/19

## 2019-07-23 ENCOUNTER — Other Ambulatory Visit: Payer: Self-pay | Admitting: Internal Medicine

## 2019-07-23 DIAGNOSIS — E782 Mixed hyperlipidemia: Secondary | ICD-10-CM

## 2019-07-23 DIAGNOSIS — I10 Essential (primary) hypertension: Secondary | ICD-10-CM

## 2019-08-03 ENCOUNTER — Other Ambulatory Visit: Payer: Self-pay | Admitting: Internal Medicine

## 2019-08-03 DIAGNOSIS — Z1231 Encounter for screening mammogram for malignant neoplasm of breast: Secondary | ICD-10-CM

## 2019-08-09 DIAGNOSIS — I2510 Atherosclerotic heart disease of native coronary artery without angina pectoris: Secondary | ICD-10-CM | POA: Diagnosis not present

## 2019-08-09 DIAGNOSIS — F17201 Nicotine dependence, unspecified, in remission: Secondary | ICD-10-CM | POA: Diagnosis not present

## 2019-08-09 DIAGNOSIS — E782 Mixed hyperlipidemia: Secondary | ICD-10-CM | POA: Diagnosis not present

## 2019-08-09 DIAGNOSIS — I10 Essential (primary) hypertension: Secondary | ICD-10-CM | POA: Diagnosis not present

## 2019-08-09 DIAGNOSIS — I700 Atherosclerosis of aorta: Secondary | ICD-10-CM | POA: Diagnosis not present

## 2019-08-21 DIAGNOSIS — M5136 Other intervertebral disc degeneration, lumbar region: Secondary | ICD-10-CM | POA: Diagnosis not present

## 2019-08-21 DIAGNOSIS — M48062 Spinal stenosis, lumbar region with neurogenic claudication: Secondary | ICD-10-CM | POA: Diagnosis not present

## 2019-08-21 DIAGNOSIS — M5416 Radiculopathy, lumbar region: Secondary | ICD-10-CM | POA: Diagnosis not present

## 2019-09-17 ENCOUNTER — Ambulatory Visit (INDEPENDENT_AMBULATORY_CARE_PROVIDER_SITE_OTHER): Payer: Medicare Other

## 2019-09-17 VITALS — BP 127/70 | Temp 97.9°F | Ht 66.0 in | Wt 168.0 lb

## 2019-09-17 DIAGNOSIS — Z0000 Encounter for general adult medical examination without abnormal findings: Secondary | ICD-10-CM

## 2019-09-17 NOTE — Patient Instructions (Signed)
Monique Hall , Thank you for taking time to come for your Medicare Wellness Visit. I appreciate your ongoing commitment to your health goals. Please review the following plan we discussed and let me know if I can assist you in the future.   Screening recommendations/referrals: Colonoscopy: no longer required Mammogram: done 09/14/18. Scheduled for 09/20/19 Bone Density: done 2010 Recommended yearly ophthalmology/optometry visit for glaucoma screening and checkup Recommended yearly dental visit for hygiene and checkup  Vaccinations: Influenza vaccine: done 05/11/19 Pneumococcal vaccine: done 06/24/14 Tdap vaccine: done 08/29/15 Shingles vaccine: Shingrix discussed. Please contact your pharmacy for coverage information.   Advanced directives: Please bring a copy of your health care power of attorney and living will to the office at your convenience once you have completed that paperwork.   Conditions/risks identified: Recommend increasing physical activity as tolerated.   Next appointment: Please follow up in one year for your Medicare Annual Wellness visit.     Preventive Care 78 Years and Older, Female Preventive care refers to lifestyle choices and visits with your health care provider that can promote health and wellness. What does preventive care include?  A yearly physical exam. This is also called an annual well check.  Dental exams once or twice a year.  Routine eye exams. Ask your health care provider how often you should have your eyes checked.  Personal lifestyle choices, including:  Daily care of your teeth and gums.  Regular physical activity.  Eating a healthy diet.  Avoiding tobacco and drug use.  Limiting alcohol use.  Practicing safe sex.  Taking low-dose aspirin every day.  Taking vitamin and mineral supplements as recommended by your health care provider. What happens during an annual well check? The services and screenings done by your health care  provider during your annual well check will depend on your age, overall health, lifestyle risk factors, and family history of disease. Counseling  Your health care provider may ask you questions about your:  Alcohol use.  Tobacco use.  Drug use.  Emotional well-being.  Home and relationship well-being.  Sexual activity.  Eating habits.  History of falls.  Memory and ability to understand (cognition).  Work and work Statistician.  Reproductive health. Screening  You may have the following tests or measurements:  Height, weight, and BMI.  Blood pressure.  Lipid and cholesterol levels. These may be checked every 5 years, or more frequently if you are over 3 years old.  Skin check.  Lung cancer screening. You may have this screening every year starting at age 23 if you have a 30-pack-year history of smoking and currently smoke or have quit within the past 15 years.  Fecal occult blood test (FOBT) of the stool. You may have this test every year starting at age 24.  Flexible sigmoidoscopy or colonoscopy. You may have a sigmoidoscopy every 5 years or a colonoscopy every 10 years starting at age 59.  Hepatitis C blood test.  Hepatitis B blood test.  Sexually transmitted disease (STD) testing.  Diabetes screening. This is done by checking your blood sugar (glucose) after you have not eaten for a while (fasting). You may have this done every 1-3 years.  Bone density scan. This is done to screen for osteoporosis. You may have this done starting at age 66.  Mammogram. This may be done every 1-2 years. Talk to your health care provider about how often you should have regular mammograms. Talk with your health care provider about your test results, treatment options, and  if necessary, the need for more tests. Vaccines  Your health care provider may recommend certain vaccines, such as:  Influenza vaccine. This is recommended every year.  Tetanus, diphtheria, and acellular  pertussis (Tdap, Td) vaccine. You may need a Td booster every 10 years.  Zoster vaccine. You may need this after age 40.  Pneumococcal 13-valent conjugate (PCV13) vaccine. One dose is recommended after age 57.  Pneumococcal polysaccharide (PPSV23) vaccine. One dose is recommended after age 3. Talk to your health care provider about which screenings and vaccines you need and how often you need them. This information is not intended to replace advice given to you by your health care provider. Make sure you discuss any questions you have with your health care provider. Document Released: 08/01/2015 Document Revised: 03/24/2016 Document Reviewed: 05/06/2015 Elsevier Interactive Patient Education  2017 [place] Prevention in the Home Falls can cause injuries. They can happen to people of all ages. There are many things you can do to make your home safe and to help prevent falls. What can I do on the outside of my home?  Regularly fix the edges of walkways and driveways and fix any cracks.  Remove anything that might make you trip as you walk through a door, such as a raised step or threshold.  Trim any bushes or trees on the path to your home.  Use bright outdoor lighting.  Clear any walking paths of anything that might make someone trip, such as rocks or tools.  Regularly check to see if handrails are loose or broken. Make sure that both sides of any steps have handrails.  Any raised decks and porches should have guardrails on the edges.  Have any leaves, snow, or ice cleared regularly.  Use sand or salt on walking paths during winter.  Clean up any spills in your garage right away. This includes oil or grease spills. What can I do in the bathroom?  Use night lights.  Install grab bars by the toilet and in the tub and shower. Do not use towel bars as grab bars.  Use non-skid mats or decals in the tub or shower.  If you need to sit down in the shower, use a plastic,  non-slip stool.  Keep the floor dry. Clean up any water that spills on the floor as soon as it happens.  Remove soap buildup in the tub or shower regularly.  Attach bath mats securely with double-sided non-slip rug tape.  Do not have throw rugs and other things on the floor that can make you trip. What can I do in the bedroom?  Use night lights.  Make sure that you have a light by your bed that is easy to reach.  Do not use any sheets or blankets that are too big for your bed. They should not hang down onto the floor.  Have a firm chair that has side arms. You can use this for support while you get dressed.  Do not have throw rugs and other things on the floor that can make you trip. What can I do in the kitchen?  Clean up any spills right away.  Avoid walking on wet floors.  Keep items that you use a lot in easy-to-reach places.  If you need to reach something above you, use a strong step stool that has a grab bar.  Keep electrical cords out of the way.  Do not use floor polish or wax that makes floors slippery. If you  must use wax, use non-skid floor wax.  Do not have throw rugs and other things on the floor that can make you trip. What can I do with my stairs?  Do not leave any items on the stairs.  Make sure that there are handrails on both sides of the stairs and use them. Fix handrails that are broken or loose. Make sure that handrails are as long as the stairways.  Check any carpeting to make sure that it is firmly attached to the stairs. Fix any carpet that is loose or worn.  Avoid having throw rugs at the top or bottom of the stairs. If you do have throw rugs, attach them to the floor with carpet tape.  Make sure that you have a light switch at the top of the stairs and the bottom of the stairs. If you do not have them, ask someone to add them for you. What else can I do to help prevent falls?  Wear shoes that:  Do not have high heels.  Have rubber  bottoms.  Are comfortable and fit you well.  Are closed at the toe. Do not wear sandals.  If you use a stepladder:  Make sure that it is fully opened. Do not climb a closed stepladder.  Make sure that both sides of the stepladder are locked into place.  Ask someone to hold it for you, if possible.  Clearly mark and make sure that you can see:  Any grab bars or handrails.  First and last steps.  Where the edge of each step is.  Use tools that help you move around (mobility aids) if they are needed. These include:  Canes.  Walkers.  Scooters.  Crutches.  Turn on the lights when you go into a dark area. Replace any light bulbs as soon as they burn out.  Set up your furniture so you have a clear path. Avoid moving your furniture around.  If any of your floors are uneven, fix them.  If there are any pets around you, be aware of where they are.  Review your medicines with your doctor. Some medicines can make you feel dizzy. This can increase your chance of falling. Ask your doctor what other things that you can do to help prevent falls. This information is not intended to replace advice given to you by your health care provider. Make sure you discuss any questions you have with your health care provider. Document Released: 05/01/2009 Document Revised: 12/11/2015 Document Reviewed: 08/09/2014 Elsevier Interactive Patient Education  2017 [employer].

## 2019-09-17 NOTE — Progress Notes (Signed)
Subjective:   Monique Hall is a 79 y.o. female who presents for Medicare Annual (Subsequent) preventive examination.  Virtual Visit via Telephone Note  I connected with Monique Hall on 09/17/19 at  3:20 PM EST by telephone and verified that I am speaking with the correct person using two identifiers.  Medicare Annual Wellness visit completed telephonically due to Covid-19 pandemic.   Location: Patient: home Provider: office   I discussed the limitations, risks, security and privacy concerns of performing an evaluation and management service by telephone and the availability of in person appointments. The patient expressed understanding and agreed to proceed.  Some vital signs may be absent or patient reported.   Clemetine Marker, LPN     Review of Systems:   Cardiac Risk Factors include: advanced age (>8men, >81 women);hypertension;dyslipidemia     Objective:     Vitals: BP 127/70   Temp 97.9 F (36.6 C)   Ht 5\' 6"  (1.676 m)   Wt 168 lb (76.2 kg)   BMI 27.12 kg/m   Body mass index is 27.12 kg/m.  Advanced Directives 09/17/2019 09/11/2018 09/05/2017 09/02/2016 04/10/2016 08/29/2015 07/24/2015  Does Patient Have a Medical Advance Directive? No No No No No No No  Would patient like information on creating a medical advance directive? No - Patient declined No - Patient declined Yes (MAU/Ambulatory/Procedural Areas - Information given) - No - patient declined information No - patient declined information No - patient declined information    Tobacco Social History   Tobacco Use  Smoking Status Former Smoker  . Packs/day: 1.00  . Years: 35.00  . Pack years: 35.00  . Types: Cigarettes  . Quit date: 09/01/2002  . Years since quitting: 17.0  Smokeless Tobacco Never Used  Tobacco Comment   smoking cessation materials not required     Counseling given: Not Answered Comment: smoking cessation materials not required   Clinical Intake:  Pre-visit preparation completed:  Yes  Pain : 0-10 Pain Score: 3  Pain Type: Chronic pain Pain Location: Back Pain Orientation: Right, Lower Pain Descriptors / Indicators: Aching, Sore Pain Onset: More than a month ago Pain Frequency: Constant     BMI - recorded: 27.12 Nutritional Status: BMI 25 -29 Overweight Nutritional Risks: None Diabetes: No  How often do you need to have someone help you when you read instructions, pamphlets, or other written materials from your doctor or pharmacy?: 1 - Never  Interpreter Needed?: No  Information entered by :: Clemetine Marker LPN  Past Medical History:  Diagnosis Date  . Allergy   . Arthritis   . Emphysema lung ([place])   . Esophagitis   . Gastritis   . Gastroesophageal reflux disease without esophagitis   . Hypercholesteremia   . Hypertension   . Lipoma of neck 04/27/2018  . Sciatica   . Tendonitis, Achilles 2016   both   . Thyroid nodule 2019   Past Surgical History:  Procedure Laterality Date  . ABDOMINAL HYSTERECTOMY  1985   total for bleeding  . APPENDECTOMY    . BIOPSY THYROID  2019  . BLADDER SUSPENSION  2004  . lipoma excision  03/2018   posterior neck   Family History  Problem Relation Age of Onset  . CAD Mother   . Diabetes Mother   . Arthritis Mother   . Hypertension Mother   . Parkinson's disease Father   . COPD Father   . Hearing loss Father   . Breast cancer Neg Hx  Social History   Socioeconomic History  . Marital status: Widowed    Spouse name: Not on file  . Number of children: 3  . Years of education: Not on file  . Highest education level: 12th grade  Occupational History  . Occupation: Retired  Tobacco Use  . Smoking status: Former Smoker    Packs/day: 1.00    Years: 35.00    Pack years: 35.00    Types: Cigarettes    Quit date: 09/01/2002    Years since quitting: 17.0  . Smokeless tobacco: Never Used  . Tobacco comment: smoking cessation materials not required  Substance and Sexual Activity  . Alcohol use: No     Alcohol/week: 0.0 standard drinks  . Drug use: No  . Sexual activity: Not Currently  Other Topics Concern  . Not on file  Social History Narrative  . Not on file   Social Determinants of Health   Financial Resource Strain: Low Risk   . Difficulty of Paying Living Expenses: Not hard at all  Food Insecurity: No Food Insecurity  . Worried About Charity fundraiser in the Last Year: Never true  . Ran Out of Food in the Last Year: Never true  Transportation Needs: No Transportation Needs  . Lack of Transportation (Medical): No  . Lack of Transportation (Non-Medical): No  Physical Activity: Inactive  . Days of Exercise per Week: 0 days  . Minutes of Exercise per Session: 0 min  Stress: No Stress Concern Present  . Feeling of Stress : Not at all  Social Connections: Somewhat Isolated  . Frequency of Communication with Friends and Family: More than three times a week  . Frequency of Social Gatherings with Friends and Family: More than three times a week  . Attends Religious Services: More than 4 times per year  . Active Member of Clubs or Organizations: No  . Attends Archivist Meetings: Never  . Marital Status: Widowed    Outpatient Encounter Medications as of 09/17/2019  Medication Sig  . aspirin EC 81 MG tablet Take 81 mg by mouth daily.  . Black Cohosh 200 MG CAPS Take 1 tablet by mouth daily. Reported on 08/20/2015  . Black Elderberry (SAMBUCUS ELDERBERRY PO) Take by mouth. Chew two gummies daily  . ezetimibe (ZETIA) 10 MG tablet TAKE 1 TABLET EVERY DAY  . loratadine (CLARITIN) 10 MG tablet Take 10 mg by mouth daily.  Marland Kitchen losartan-hydrochlorothiazide (HYZAAR) 100-12.5 MG tablet TAKE 1 TABLET EVERY DAY  . triamcinolone (NASACORT ALLERGY 24HR) 55 MCG/ACT AERO nasal inhaler Place 1 spray into the nose at bedtime. 1 spray each nostril QHS  . [DISCONTINUED] omeprazole (PRILOSEC) 20 MG capsule Take 20 mg by mouth as needed. Reported on 07/24/2015   No facility-administered  encounter medications on file as of 09/17/2019.    Activities of Daily Living In your present state of health, do you have any difficulty performing the following activities: 09/17/2019  Hearing? Y  Comment wears hearing aids  Vision? N  Difficulty concentrating or making decisions? N  Walking or climbing stairs? N  Dressing or bathing? N  Doing errands, shopping? N  Preparing Food and eating ? N  Using the Toilet? N  In the past six months, have you accidently leaked urine? N  Do you have problems with loss of bowel control? N  Managing your Medications? N  Managing your Finances? N  Housekeeping or managing your Housekeeping? N  Some recent data might be hidden  [name], MD as PCP - General (Family Medicine)    Assessment:   This is a routine wellness examination for [facility].  Exercise Activities and Dietary recommendations Current Exercise Habits: The patient does not participate in regular exercise at present, Exercise limited by: orthopedic condition(s)  Goals    . DIET - INCREASE WATER INTAKE     Recommend to drink at least 6-8 8oz glasses of water per day.       Fall Risk Fall Risk  09/17/2019 09/15/2018 09/11/2018 09/05/2017 09/02/2016  Falls in the past year? 0 0 0 No No  Number falls in past yr: 0 0 0 - -  Injury with Fall? 0 0 0 - -  Risk for fall due to : Orthopedic patient - - - -  Follow up Falls prevention discussed - Falls prevention discussed - -   FALL RISK PREVENTION PERTAINING TO THE HOME:  Any stairs in or around the home? Yes  If so, do they handrails? Yes   Home free of loose throw rugs in walkways, pet beds, electrical cords, etc? Yes  Adequate lighting in your home to reduce risk of falls? Yes   ASSISTIVE DEVICES UTILIZED TO PREVENT FALLS:  Life alert? No  Use of a cane, walker or w/c? No  Grab bars in the bathroom? Yes  Shower chair or bench in shower? Yes  Elevated toilet seat or a handicapped toilet? Yes  DME  ORDERS:  DME order needed?  No   TIMED UP AND GO:  Was the test performed? No . Telephonic visit.   Education: Fall risk prevention has been discussed.  Intervention(s) required? No    Depression Screen PHQ 2/9 Scores 09/17/2019 09/15/2018 09/11/2018 09/05/2017  PHQ - 2 Score 0 0 0 0  PHQ- 9 Score - 0 - 0     Cognitive Function     6CIT Screen 09/17/2019 09/11/2018 09/05/2017 09/02/2016  What Year? 0 points 0 points 0 points 0 points  What month? 0 points 0 points 0 points 0 points  What time? 0 points 0 points 0 points 0 points  Count back from 20 0 points 0 points 0 points 0 points  Months in reverse 0 points 0 points 0 points 0 points  Repeat phrase 0 points 0 points 6 points 0 points  Total Score 0 0 6 0    Immunization History  Administered Date(s) Administered  . Influenza-Unspecified 04/28/2017, 05/11/2018, 05/11/2019  . Pneumococcal Conjugate-13 06/24/2014  . Pneumococcal Polysaccharide-23 07/21/2011  . Tdap 07/20/2005, 08/29/2015  . Zoster 07/20/2000    Qualifies for Shingles Vaccine? Yes . Due for Shingrix. Education has been provided regarding the importance of this vaccine. Pt has been advised to call insurance company to determine out of pocket expense. Advised may also receive vaccine at local pharmacy or Health Dept. Verbalized acceptance and understanding.  Tdap: Up to date  Flu Vaccine: Up to date  Pneumococcal Vaccine: Up to date    Screening Tests Health Maintenance  Topic Date Due  . MAMMOGRAM  09/15/2019  . TETANUS/TDAP  08/28/2025  . INFLUENZA VACCINE  Completed  . DEXA SCAN  Completed  . PNA vac Low Risk Adult  Completed    Cancer Screenings:  Colorectal Screening: Completed 2012.  No longer required.   Mammogram: Completed 09/14/18. Scheduled for 09/20/19.   Bone Density: Completed 01/15/2009. Results reflect  OSTEOPENIA. Repeat every 2 years. Pt requests to postpone at this time.   Lung Cancer Screening: (Low  Dose CT Chest recommended if  Age 73-80 years, 30 pack-year currently smoking OR have quit w/in 15years.) does qualify. Chest CT scheduled for 12/06/19.    Additional Screening:  Hepatitis C Screening: no longer required  Vision Screening: Recommended annual ophthalmology exams for early detection of glaucoma and other disorders of the eye. Is the patient up to date with their annual eye exam?  Yes  Who is the provider or what is the name of the office in which the pt attends annual eye exams? Dr. Ellin Mayhew  Dental Screening: Recommended annual dental exams for proper oral hygiene  Community Resource Referral:  CRR required this visit?  No      Plan:     I have personally reviewed and addressed the Medicare Annual Wellness questionnaire and have noted the following in the patient's chart:  A. Medical and social history B. Use of alcohol, tobacco or illicit drugs  C. Current medications and supplements D. Functional ability and status E.  Nutritional status F.  Physical activity G. Advance directives H. List of other physicians I.  Hospitalizations, surgeries, and ER visits in previous 12 months J.  [place] such as hearing and vision if needed, cognitive and depression L. Referrals and appointments   In addition, I have reviewed and discussed with patient certain preventive protocols, quality metrics, and best practice recommendations. A written personalized care plan for preventive services as well as general preventive health recommendations were provided to patient.   Signed,  Clemetine Marker, LPN Nurse Health Advisor   Nurse Notes:

## 2019-09-20 ENCOUNTER — Ambulatory Visit (INDEPENDENT_AMBULATORY_CARE_PROVIDER_SITE_OTHER): Payer: Medicare Other | Admitting: Internal Medicine

## 2019-09-20 ENCOUNTER — Encounter: Payer: Self-pay | Admitting: Internal Medicine

## 2019-09-20 ENCOUNTER — Ambulatory Visit
Admission: RE | Admit: 2019-09-20 | Discharge: 2019-09-20 | Disposition: A | Discharge status: Home / Self Care | Payer: Medicare Other | Source: Ambulatory Visit | Attending: Internal Medicine | Admitting: Internal Medicine

## 2019-09-20 ENCOUNTER — Other Ambulatory Visit: Payer: Self-pay

## 2019-09-20 VITALS — BP 136/76 | HR 90 | Temp 97.9°F | Ht 66.0 in | Wt 175.0 lb

## 2019-09-20 DIAGNOSIS — Z1231 Encounter for screening mammogram for malignant neoplasm of breast: Secondary | ICD-10-CM | POA: Diagnosis not present

## 2019-09-20 DIAGNOSIS — E782 Mixed hyperlipidemia: Secondary | ICD-10-CM

## 2019-09-20 DIAGNOSIS — I10 Essential (primary) hypertension: Secondary | ICD-10-CM

## 2019-09-20 DIAGNOSIS — F17201 Nicotine dependence, unspecified, in remission: Secondary | ICD-10-CM | POA: Diagnosis not present

## 2019-09-20 DIAGNOSIS — E041 Nontoxic single thyroid nodule: Secondary | ICD-10-CM

## 2019-09-20 LAB — POCT URINALYSIS DIPSTICK
Bilirubin, UA: NEGATIVE
Blood, UA: NEGATIVE
Glucose, UA: NEGATIVE
Ketones, UA: NEGATIVE
Leukocytes, UA: NEGATIVE
Nitrite, UA: NEGATIVE
Protein, UA: NEGATIVE
Spec Grav, UA: 1.015 (ref 1.010–1.025)
Urobilinogen, UA: 0.2 E.U./dL
pH, UA: 6 (ref 5.0–8.0)

## 2019-09-20 NOTE — Progress Notes (Signed)
Patient: Monique Hall, Female    DOB: [birth year], 79 y.o.   MRN: [id number] Visit Date: 09/20/2019  Today's Provider: Halina Maidens, MD   Chief Complaint  Patient presents with  . Annual Exam    Breast Exam. No pap- aged out.    Subjective:    Annual wellness visit Monique Hall is a 79 y.o. female who presents today for her Subsequent Annual Wellness Visit. She feels well. She reports exercising some carry for a 4 year great grandson. She reports she is sleeping well. She denies breast issues.  Mammogram - scheduled today DEXA  12/2008 - pt does not wish to repeat Colonoscopy  07/2010 - no more needed LDCT screening scheduled 11/2019 Immunization History  Administered Date(s) Administered  . Influenza-Unspecified 04/28/2017, 05/11/2018, 05/11/2019  . Pneumococcal Conjugate-13 06/24/2014  . Pneumococcal Polysaccharide-23 07/21/2011  . Tdap 07/20/2005, 08/29/2015  . Zoster 07/20/2000    ----------------------------------------------------------- Hypertension This is a chronic problem. The problem is unchanged. The problem is controlled (at home 137/78). Pertinent negatives include no chest pain, headaches, palpitations or shortness of breath. Past treatments include ACE inhibitors and diuretics. The current treatment provides significant improvement.  Hyperlipidemia The problem is resistant. Pertinent negatives include no chest pain or shortness of breath. Current antihyperlipidemic treatment includes ezetimibe. The current treatment provides mild improvement of lipids.   Lab Results  Component Value Date   CREATININE 0.88 09/15/2018   BUN 14 09/15/2018   NA 139 09/15/2018   K 4.6 09/15/2018   CL 102 09/15/2018   CO2 22 09/15/2018   Lab Results  Component Value Date   CHOL 264 (H) 09/15/2018   HDL 53 09/15/2018   LDLCALC 165 (H) 09/15/2018   TRIG 230 (H) 09/15/2018   CHOLHDL 5.0 (H) 09/15/2018   Lab Results  Component Value Date   TSH 2.100 09/15/2018   Lab  Results  Component Value Date   WBC 7.0 09/15/2018   HGB 13.3 09/15/2018   HCT 39.4 09/15/2018   MCV 90 09/15/2018   PLT 392 09/15/2018    Review of Systems  Constitutional: Negative for chills, fatigue and fever.  HENT: Positive for hearing loss (has aids). Negative for congestion, tinnitus, trouble swallowing and voice change.   Eyes: Negative for visual disturbance.  Respiratory: Negative for cough, chest tightness, shortness of breath and wheezing.   Cardiovascular: Negative for chest pain, palpitations and leg swelling.  Gastrointestinal: Negative for abdominal pain, constipation, diarrhea and vomiting.  Endocrine: Negative for polydipsia and polyuria.  Genitourinary: Negative for dysuria, frequency, genital sores, vaginal bleeding and vaginal discharge.  Musculoskeletal: Negative for arthralgias, gait problem and joint swelling.  Skin: Negative for color change and rash.  Allergic/Immunologic: Positive for environmental allergies.  Neurological: Negative for dizziness, tremors, light-headedness and headaches.  Hematological: Negative for adenopathy. Does not bruise/bleed easily.  Psychiatric/Behavioral: Negative for dysphoric mood and sleep disturbance. The patient is not nervous/anxious.     Social History   Socioeconomic History  . Marital status: Widowed    Spouse name: Not on file  . Number of children: 3  . Years of education: Not on file  . Highest education level: 12th grade  Occupational History  . Occupation: Retired  Tobacco Use  . Smoking status: Former Smoker    Packs/day: 1.00    Years: 35.00    Pack years: 35.00    Types: Cigarettes    Quit date: 09/01/2002    Years since quitting: 17.0  . Smokeless tobacco: Never Used  .  Tobacco comment: smoking cessation materials not required  Substance and Sexual Activity  . Alcohol use: No    Alcohol/week: 0.0 standard drinks  . Drug use: No  . Sexual activity: Not Currently  Other Topics Concern  . Not on  file  Social History Narrative  . Not on file   Social Determinants of Health   Financial Resource Strain: Low Risk   . Difficulty of Paying Living Expenses: Not hard at all  Food Insecurity: No Food Insecurity  . Worried About Charity fundraiser in the Last Year: Never true  . Ran Out of Food in the Last Year: Never true  Transportation Needs: No Transportation Needs  . Lack of Transportation (Medical): No  . Lack of Transportation (Non-Medical): No  Physical Activity: Inactive  . Days of Exercise per Week: 0 days  . Minutes of Exercise per Session: 0 min  Stress: No Stress Concern Present  . Feeling of Stress : Not at all  Social Connections: Somewhat Isolated  . Frequency of Communication with Friends and Family: More than three times a week  . Frequency of Social Gatherings with Friends and Family: More than three times a week  . Attends Religious Services: More than 4 times per year  . Active Member of Clubs or Organizations: No  . Attends Archivist Meetings: Never  . Marital Status: Widowed  Intimate Partner Violence: Not At Risk  . Fear of Current or Ex-Partner: No  . Emotionally Abused: No  . Physically Abused: No  . Sexually Abused: No    Patient Active Problem List   Diagnosis Date Noted  . Aortic atherosclerosis ([place]) 09/15/2018  . Centrilobular emphysema ([place]) 09/15/2018  . Coronary artery disease 09/15/2018  . Thyroid nodule 10/06/2017  . Gastroesophageal reflux disease without esophagitis 09/02/2016  . Essential hypertension 07/29/2015  . Mixed hyperlipidemia 07/29/2015  . Menopausal symptoms 07/29/2015  . Tobacco use disorder, moderate, in sustained remission 07/29/2015  . Degeneration of intervertebral disc of lumbar region 06/17/2015  . 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 Sacroiliac strain 01/28/2015    Past Surgical History:  Procedure Laterality Date  . ABDOMINAL HYSTERECTOMY  1985    total for bleeding  . APPENDECTOMY    . BIOPSY THYROID  2019  . BLADDER SUSPENSION  2004  . lipoma excision  03/2018   posterior neck    Her family history includes Arthritis in her mother; CAD in her mother; COPD in her father; Diabetes in her mother; Hearing loss in her father; Hypertension in her mother; Parkinson's disease in her father.     Current Meds  Medication Sig  . aspirin EC 81 MG tablet Take 81 mg by mouth daily.  . Black Cohosh 200 MG CAPS Take 1 tablet by mouth daily. Reported on 08/20/2015  . Black Elderberry (SAMBUCUS ELDERBERRY PO) Take by mouth. Chew two gummies daily  . ezetimibe (ZETIA) 10 MG tablet TAKE 1 TABLET EVERY DAY  . loratadine (CLARITIN) 10 MG tablet Take 10 mg by mouth daily.  Marland Kitchen losartan-hydrochlorothiazide (HYZAAR) 100-12.5 MG tablet TAKE 1 TABLET EVERY DAY  . triamcinolone (NASACORT ALLERGY 24HR) 55 MCG/ACT AERO nasal inhaler Place 1 spray into the nose at bedtime. 1 spray each nostril QHS    [name], MD as PCP - General (Internal Medicine)       Objective:   Wt Readings from Last 3 Encounters:  09/20/19 175 lb (79.4 kg)  09/17/19 168 lb (76.2 kg)  09/15/18 173 lb (78.5 kg)    Vitals: BP 136/76   Pulse 90   Temp 97.9 F (36.6 C) (Temporal)   Ht 5\' 6"  (1.676 m)   Wt 175 lb (79.4 kg)   SpO2 95%   BMI 28.25 kg/m   Physical Exam Vitals and nursing note reviewed.  Constitutional:      General: She is not in acute distress.    Appearance: She is well-developed.  HENT:     Head: Normocephalic and atraumatic.     Right Ear: Tympanic membrane and ear canal normal.     Left Ear: Tympanic membrane and ear canal normal.     Nose:     Right Sinus: No maxillary sinus tenderness.     Left Sinus: No maxillary sinus tenderness.  Eyes:     General: No scleral icterus.       Right eye: No discharge.        Left eye: No discharge.     Conjunctiva/sclera: Conjunctivae normal.  Neck:     Thyroid: No thyromegaly.      Vascular: No carotid bruit.  Cardiovascular:     Rate and Rhythm: Normal rate and regular rhythm.     Pulses: Normal pulses.     Heart sounds: Normal heart sounds.  Pulmonary:     Effort: Pulmonary effort is normal. No respiratory distress.     Breath sounds: No wheezing.  Chest:     Breasts:        Right: No mass, nipple discharge, skin change or tenderness.        Left: No mass, nipple discharge, skin change or tenderness.  Abdominal:     General: Bowel sounds are normal.     Palpations: Abdomen is soft.     Tenderness: There is no abdominal tenderness.  Musculoskeletal:        General: Normal range of motion.     Cervical back: Normal range of motion. No erythema.  Lymphadenopathy:     Cervical: No cervical adenopathy.  Skin:    General: Skin is warm and dry.     Findings: No rash.  Neurological:     Mental Status: She is alert and oriented to person, place, and time.     Cranial Nerves: No cranial nerve deficit.     Sensory: No sensory deficit.     Deep Tendon Reflexes: Reflexes are normal and symmetric.  Psychiatric:        Speech: Speech normal.        Behavior: Behavior normal.        Thought Content: Thought content normal.     Activities of Daily Living In your present state of health, do you have any difficulty performing the following activities: 09/20/2019 09/17/2019  Hearing? Tempie Donning  Comment hearing aids in both ears wears hearing aids  Vision? N N  Comment wears glasses -  Difficulty concentrating or making decisions? N N  Walking or climbing stairs? N N  Dressing or bathing? N N  Doing errands, shopping? N N  Preparing Food and eating ? - N  Using the Toilet? - N  In the past six months, have you accidently leaked urine? - N  Do you have problems with loss of bowel control? - N  Managing your Medications? - N  Managing your Finances? - N  Housekeeping or managing your Housekeeping? - N  Some recent data might be hidden    Fall Risk Assessment  Fall  Risk  09/20/2019 09/17/2019 09/15/2018 09/11/2018 09/05/2017  Falls in the past year? 0 0 0 0 No  Number falls in past yr: 0 0 0 0 -  Injury with Fall? 0 0 0 0 -  Risk for fall due to : - Orthopedic patient - - -  Follow up Falls evaluation completed Falls prevention discussed - Falls prevention discussed -     Depression Screen PHQ 2/9 Scores 09/20/2019 09/17/2019 09/15/2018 09/11/2018  PHQ - 2 Score 0 0 0 0  PHQ- 9 Score - - 0 -    6CIT Screen 09/20/2019 09/17/2019 09/11/2018 09/05/2017 09/02/2016  What Year? 0 points 0 points 0 points 0 points 0 points  What month? 0 points 0 points 0 points 0 points 0 points  What time? 0 points 0 points 0 points 0 points 0 points  Count back from 20 0 points 0 points 0 points 0 points 0 points  Months in reverse 0 points 0 points 0 points 0 points 0 points  Repeat phrase 2 points 0 points 0 points 6 points 0 points  Total Score 2 0 0 6 0    Medicare Annual Wellness Visit Summary:  Reviewed patient's Family Medical History Reviewed and updated list of patient's medical providers Assessment of cognitive impairment was done Assessed patient's functional ability Established a written schedule for health screening [place] Completed and Reviewed  Exercise Activities and Dietary recommendations Goals    . DIET - INCREASE WATER INTAKE     Recommend to drink at least 6-8 8oz glasses of water per day.       Immunization History  Administered Date(s) Administered  . Influenza-Unspecified 04/28/2017, 05/11/2018, 05/11/2019  . Pneumococcal Conjugate-13 06/24/2014  . Pneumococcal Polysaccharide-23 07/21/2011  . Tdap 07/20/2005, 08/29/2015  . Zoster 07/20/2000    Health Maintenance  Topic Date Due  . MAMMOGRAM  09/15/2019  . TETANUS/TDAP  08/28/2025  . INFLUENZA VACCINE  Completed  . DEXA SCAN  Completed  . PNA vac Low Risk Adult  Completed    Discussed health benefits of physical activity, and encouraged her to engage in regular  exercise appropriate for her age and condition.    ------------------------------------------------------------------------------------------------------------  Assessment & Plan:  1. Essential hypertension Clinically stable exam with well controlled BP on losartan hct. Tolerating medications without side effects at this time. Pt to continue current regimen and low sodium diet; benefits of regular exercise as able discussed. - CBC with Differential/Platelet - Comprehensive metabolic panel - POCT urinalysis dipstick  2. Mixed hyperlipidemia Intolerant of statins Taking Zetia daily without complications - Lipid panel  3. Thyroid nodule Followed by ENT with negative bx Repeat US done last year - nodules stable Pt has no symptoms of thyroid imbalance - TSH + free T4  4. Tobacco use disorder, moderate, in sustained remission Scheduled for LDCT in May   Partially dictated using Dragon software. Any errors are unintentional.  Halina Maidens, MD [facility]  09/20/2019

## 2019-09-20 NOTE — Patient Instructions (Signed)
Covid Vaccine locations: [employer] [facility] Dept. [place]  [email or web address]

## 2019-09-21 LAB — CBC WITH DIFFERENTIAL/PLATELET
Basophils Absolute: 0.1 10*3/uL (ref 0.0–0.2)
Basos: 1 %
EOS (ABSOLUTE): 0.3 10*3/uL (ref 0.0–0.4)
Eos: 3 %
Hematocrit: 36.7 % (ref 34.0–46.6)
Hemoglobin: 12.5 g/dL (ref 11.1–15.9)
Immature Grans (Abs): 0 10*3/uL (ref 0.0–0.1)
Immature Granulocytes: 0 %
Lymphocytes Absolute: 3.5 10*3/uL — ABNORMAL HIGH (ref 0.7–3.1)
Lymphs: 44 %
MCH: 31.3 pg (ref 26.6–33.0)
MCHC: 34.1 g/dL (ref 31.5–35.7)
MCV: 92 fL (ref 79–97)
Monocytes Absolute: 0.7 10*3/uL (ref 0.1–0.9)
Monocytes: 9 %
Neutrophils Absolute: 3.4 10*3/uL (ref 1.4–7.0)
Neutrophils: 43 %
Platelets: 422 10*3/uL (ref 150–450)
RBC: 3.99 x10E6/uL (ref 3.77–5.28)
RDW: 14.2 % (ref 11.7–15.4)
WBC: 8 10*3/uL (ref 3.4–10.8)

## 2019-09-21 LAB — COMPREHENSIVE METABOLIC PANEL
ALT: 21 IU/L (ref 0–32)
AST: 20 IU/L (ref 0–40)
Albumin/Globulin Ratio: 1.7 (ref 1.2–2.2)
Albumin: 4.8 g/dL — ABNORMAL HIGH (ref 3.7–4.7)
Alkaline Phosphatase: 44 IU/L (ref 39–117)
BUN/Creatinine Ratio: 18 (ref 12–28)
BUN: 18 mg/dL (ref 8–27)
Bilirubin Total: 0.6 mg/dL (ref 0.0–1.2)
CO2: 21 mmol/L (ref 20–29)
Calcium: 10.5 mg/dL — ABNORMAL HIGH (ref 8.7–10.3)
Chloride: 102 mmol/L (ref 96–106)
Creatinine, Ser: 0.99 mg/dL (ref 0.57–1.00)
GFR calc Af Amer: 63 mL/min/{1.73_m2} (ref >59)
GFR calc non Af Amer: 55 mL/min/{1.73_m2} — ABNORMAL LOW (ref >59)
Globulin, Total: 2.8 g/dL (ref 1.5–4.5)
Glucose: 105 mg/dL — ABNORMAL HIGH (ref 65–99)
Potassium: 4.8 mmol/L (ref 3.5–5.2)
Sodium: 140 mmol/L (ref 134–144)
Total Protein: 7.6 g/dL (ref 6.0–8.5)

## 2019-09-21 LAB — TSH+FREE T4
Free T4: 1.05 ng/dL (ref 0.82–1.77)
TSH: 1.98 u[IU]/mL (ref 0.450–4.500)

## 2019-09-21 LAB — LIPID PANEL
Chol/HDL Ratio: 4.4 ratio (ref 0.0–4.4)
Cholesterol, Total: 268 mg/dL — ABNORMAL HIGH (ref 100–199)
HDL: 61 mg/dL (ref >39)
LDL Chol Calc (NIH): 162 mg/dL — ABNORMAL HIGH (ref 0–99)
Triglycerides: 242 mg/dL — ABNORMAL HIGH (ref 0–149)
VLDL Cholesterol Cal: 45 mg/dL — ABNORMAL HIGH (ref 5–40)

## 2019-09-24 ENCOUNTER — Telehealth: Payer: Self-pay

## 2019-09-24 NOTE — Telephone Encounter (Signed)
Pt called office this morning questions when her next appointment was. Pt called and informed that she was to follow up in 6 months with Dr. Army Melia for HTN. Pt voiced that she understood.

## 2019-12-04 ENCOUNTER — Other Ambulatory Visit: Payer: Medicare Other

## 2019-12-05 ENCOUNTER — Ambulatory Visit: Payer: Medicare Other | Admitting: Oncology

## 2019-12-06 ENCOUNTER — Ambulatory Visit
Admission: RE | Admit: 2019-12-06 | Discharge: 2019-12-06 | Disposition: A | Discharge status: Home / Self Care | Payer: Medicare Other | Source: Ambulatory Visit | Attending: Oncology | Admitting: Oncology

## 2019-12-06 ENCOUNTER — Other Ambulatory Visit: Payer: Self-pay

## 2019-12-06 DIAGNOSIS — R911 Solitary pulmonary nodule: Secondary | ICD-10-CM | POA: Insufficient documentation

## 2019-12-06 DIAGNOSIS — J439 Emphysema, unspecified: Secondary | ICD-10-CM | POA: Diagnosis not present

## 2019-12-07 ENCOUNTER — Inpatient Hospital Stay: Payer: Medicare Other | Attending: Oncology | Admitting: Oncology

## 2019-12-07 DIAGNOSIS — Z90710 Acquired absence of both cervix and uterus: Secondary | ICD-10-CM | POA: Diagnosis not present

## 2019-12-07 DIAGNOSIS — E041 Nontoxic single thyroid nodule: Secondary | ICD-10-CM | POA: Diagnosis not present

## 2019-12-07 DIAGNOSIS — E7800 Pure hypercholesterolemia, unspecified: Secondary | ICD-10-CM | POA: Insufficient documentation

## 2019-12-07 DIAGNOSIS — Z7982 Long term (current) use of aspirin: Secondary | ICD-10-CM | POA: Insufficient documentation

## 2019-12-07 DIAGNOSIS — Z87891 Personal history of nicotine dependence: Secondary | ICD-10-CM | POA: Diagnosis not present

## 2019-12-07 DIAGNOSIS — Z79899 Other long term (current) drug therapy: Secondary | ICD-10-CM | POA: Insufficient documentation

## 2019-12-07 DIAGNOSIS — E785 Hyperlipidemia, unspecified: Secondary | ICD-10-CM | POA: Diagnosis not present

## 2019-12-07 DIAGNOSIS — R911 Solitary pulmonary nodule: Secondary | ICD-10-CM

## 2019-12-07 DIAGNOSIS — I10 Essential (primary) hypertension: Secondary | ICD-10-CM | POA: Diagnosis not present

## 2019-12-07 DIAGNOSIS — Z8249 Family history of ischemic heart disease and other diseases of the circulatory system: Secondary | ICD-10-CM | POA: Diagnosis not present

## 2019-12-07 DIAGNOSIS — Z833 Family history of diabetes mellitus: Secondary | ICD-10-CM | POA: Insufficient documentation

## 2019-12-07 DIAGNOSIS — Z8261 Family history of arthritis: Secondary | ICD-10-CM | POA: Insufficient documentation

## 2019-12-07 DIAGNOSIS — R918 Other nonspecific abnormal finding of lung field: Secondary | ICD-10-CM | POA: Insufficient documentation

## 2019-12-07 NOTE — Progress Notes (Addendum)
Pulmonary Nodule Clinic Consult note [facility]  [phone] [phone]  [name], [name], MD as Referring Physician (Physical Medicine and Rehabilitation)   Name of the patient: Monique Hall  TG:[id number]  [birth year]   Date of visit: 12/07/2019   Diagnosis- Lung Nodule  Chief complaint/ Reason for visit- Pulmonary Nodule Clinic Initial Visit  Past Medical History:  Patient is managed/referred from LDCT screening program. She does not meet criteria for the LDCT screening program at this time.  Lung screening from 09/29/2017 revealed tiny bilateral pulmonary nodules measuring up to a maximum volume of 2.7 mm.  Continued annual screening recommended.  She was referred to the pulmonary nodule clinic and repeat Ct scan was ordered. She was evaluated after her scan on 11/24/2018 to discuss findings.  CT scan showed stable small subsolid pulmonary nodules with onew easuring up to 6 mm in size.  When compared to low-dose CT screening from 09/29/2017 there was an increase from 2.7 to 6 mm in LUL. Follow-up in 1 year recommended.   Interval history-Monique Hall presents to the lung nodule clinic to discuss most recent CT chest without contrast.  Patient was referred by PCP initially to lung screening program by Dr. Army Melia.  Based on low-dose CT screening guidelines she has quit smoking greater than 15 years ago which makes her ineligible for continued LDCT screening.  Given she has persistent bilateral pulmonary nodules she meets criteria for the pulmonary nodule clinic.  Per medical record review, patient has past medical history of hypertension, GERD, hyperlipidemia, thyroid problem, otalgia, aortic arthrosclerosis and CAD.  She currently does not smoke cigarettes.  Quit date greater than 15 years ago.  She is retired.   She is widowed.  She does not have any children.    She denies any occupational exposure.   Familycancerhistory include: Mother has history of CAD and diabetes. Father has history of Parkinson's disease. No history of cancer per patient.  During the interim, patient has been doing well.  She and her family developed Covid back in December.  She thinks her son was infected and unfortunately gave it to the rest of the family.  She states she really did not have many symptoms of Covid except for a low-grade fever.  She has not been vaccinated.  She denies any respiratory concerns at this time.  She continues to have chronic lower back pain.  She has had physical therapy and steroid injections in the past with some relief.  She states today "I am due for another but have not made my appointment yet".  She uses Tylenol for temporary relief of back pain.   Based on lung screening risk assessment, patient is atHIGH risk for the development of lung cancer.She deniesany concerns at this time.Denies any neurologic complaints. Denies recent fevers or illnesses. Denies any easy bleeding or bruising. Reports good appetite and denies weight loss. Denies chest pain. Denies any nausea, vomiting, constipation, or diarrhea. Denies urinary complaints.   ECOG FS:1 - Symptomatic but completely ambulatory  Review of systems- Review of Systems  Constitutional: Negative.  Negative for chills, fever, malaise/fatigue and weight loss.  HENT: Negative for congestion, ear pain and tinnitus.   Eyes: Negative.  Negative for blurred vision and double vision.  Respiratory: Negative.  Negative for cough, sputum production and shortness of breath.   Cardiovascular: Negative.  Negative for chest pain, palpitations and leg swelling.  Gastrointestinal:  Negative.  Negative for abdominal pain, constipation, diarrhea, nausea and vomiting.  Genitourinary: Negative for dysuria, frequency and urgency.  Musculoskeletal: Positive for back pain. Negative for falls.  Skin:  Negative.  Negative for rash.  Neurological: Negative.  Negative for weakness and headaches.  Endo/Heme/Allergies: Negative.  Does not bruise/bleed easily.  Psychiatric/Behavioral: Negative.  Negative for depression. The patient is not nervous/anxious and does not have insomnia.      Allergies  Allergen Reactions  . Baclofen     Other reaction(s): Unknown  . Etodolac Nausea Only     Past Medical History:  Diagnosis Date  . Allergy   . Arthritis   . Emphysema lung ([place])   . Esophagitis   . Gastritis   . Gastroesophageal reflux disease without esophagitis   . Hypercholesteremia   . Hypertension   . Lipoma of neck 04/27/2018  . Sciatica   . Tendonitis, Achilles 2016   both   . Thyroid nodule 2019     Past Surgical History:  Procedure Laterality Date  . ABDOMINAL HYSTERECTOMY  1985   total for bleeding  . APPENDECTOMY    . BIOPSY THYROID  2019  . BLADDER SUSPENSION  2004  . lipoma excision  03/2018   posterior neck    Social History   Socioeconomic History  . Marital status: Widowed    Spouse name: Not on file  . Number of children: 3  . Years of education: Not on file  . Highest education level: 12th grade  Occupational History  . Occupation: Retired  Tobacco Use  . Smoking status: Former Smoker    Packs/day: 1.00    Years: 35.00    Pack years: 35.00    Types: Cigarettes    Quit date: 09/01/2002    Years since quitting: 17.2  . Smokeless tobacco: Never Used  . Tobacco comment: smoking cessation materials not required  Substance and Sexual Activity  . Alcohol use: No    Alcohol/week: 0.0 standard drinks  . Drug use: No  . Sexual activity: Not Currently  Other Topics Concern  . Not on file  Social History Narrative  . Not on file   Social Determinants of Health   Financial Resource Strain: Low Risk   . Difficulty of Paying Living Expenses: Not hard at all  Food Insecurity: No Food Insecurity  . Worried About Charity fundraiser in the Last Year:  Never true  . Ran Out of Food in the Last Year: Never true  Transportation Needs: No Transportation Needs  . Lack of Transportation (Medical): No  . Lack of Transportation (Non-Medical): No  Physical Activity: Inactive  . Days of Exercise per Week: 0 days  . Minutes of Exercise per Session: 0 min  Stress: No Stress Concern Present  . Feeling of Stress : Not at all  Social Connections: Somewhat Isolated  . Frequency of Communication with Friends and Family: More than three times a week  . Frequency of Social Gatherings with Friends and Family: More than three times a week  . Attends Religious Services: More than 4 times per year  . Active Member of Clubs or Organizations: No  . Attends Archivist Meetings: Never  . Marital Status: Widowed  Intimate Partner Violence: Not At Risk  . Fear of Current or Ex-Partner: No  . Emotionally Abused: No  . Physically Abused: No  . Sexually Abused: No    Family History  Problem Relation Age of Onset  . CAD Mother   .  Diabetes Mother   . Arthritis Mother   . Hypertension Mother   . Parkinson's disease Father   . COPD Father   . Hearing loss Father   . Breast cancer Neg Hx      Current Outpatient Medications:  .  aspirin EC 81 MG tablet, Take 81 mg by mouth daily., Disp: , Rfl:  .  Black Cohosh 200 MG CAPS, Take 1 tablet by mouth daily. Reported on 08/20/2015, Disp: , Rfl:  .  Black Elderberry (SAMBUCUS ELDERBERRY PO), Take by mouth. Chew two gummies daily, Disp: , Rfl:  .  ezetimibe (ZETIA) 10 MG tablet, TAKE 1 TABLET EVERY DAY, Disp: 90 tablet, Rfl: 3 .  loratadine (CLARITIN) 10 MG tablet, Take 10 mg by mouth daily., Disp: , Rfl:  .  losartan-hydrochlorothiazide (HYZAAR) 100-12.5 MG tablet, TAKE 1 TABLET EVERY DAY, Disp: 90 tablet, Rfl: 3 .  triamcinolone (NASACORT ALLERGY 24HR) 55 MCG/ACT AERO nasal inhaler, Place 1 spray into the nose at bedtime. 1 spray each nostril QHS, Disp: , Rfl:   Physical exam: There were no vitals  filed for this visit.  Physical Exam Constitutional:      Appearance: Normal appearance.  HENT:     Head: Normocephalic and atraumatic.  Eyes:     Pupils: Pupils are equal, round, and reactive to light.  Cardiovascular:     Rate and Rhythm: Normal rate and regular rhythm.     Heart sounds: Normal heart sounds. No murmur.  Pulmonary:     Effort: Pulmonary effort is normal.     Breath sounds: Normal breath sounds. No wheezing.  Abdominal:     General: Bowel sounds are normal. There is no distension.     Palpations: Abdomen is soft.     Tenderness: There is no abdominal tenderness.  Musculoskeletal:        General: Normal range of motion.     Cervical back: Normal range of motion.  Skin:    General: Skin is warm and dry.     Findings: No rash.  Neurological:     Mental Status: She is alert and oriented to person, place, and time.  Psychiatric:        Judgment: Judgment normal.       CMP Latest Ref Rng & Units 09/20/2019  Glucose 65 - 99 mg/dL 105(H)  BUN 8 - 27 mg/dL 18  Creatinine 0.57 - 1.00 mg/dL 0.99  Sodium 134 - 144 mmol/L 140  Potassium 3.5 - 5.2 mmol/L 4.8  Chloride 96 - 106 mmol/L 102  CO2 20 - 29 mmol/L 21  Calcium 8.7 - 10.3 mg/dL 10.5(H)  Total Protein 6.0 - 8.5 g/dL 7.6  Total Bilirubin 0.0 - 1.2 mg/dL 0.6  Alkaline Phos 39 - 117 IU/L 44  AST 0 - 40 IU/L 20  ALT 0 - 32 IU/L 21   CBC Latest Ref Rng & Units 09/20/2019  WBC 3.4 - 10.8 x10E3/uL 8.0  Hemoglobin 11.1 - 15.9 g/dL 12.5  Hematocrit 34.0 - 46.6 % 36.7  Platelets 150 - 450 x10E3/uL 422    No images are attached to the encounter.  CT Chest Wo Contrast  Result Date: 12/06/2019 CLINICAL DATA:  Follow-up lung nodule. EXAM: CT CHEST WITHOUT CONTRAST TECHNIQUE: Multidetector CT imaging of the chest was performed following the standard protocol without IV contrast. COMPARISON:  Nov 24, 2018 FINDINGS: Cardiovascular: There is mild to moderate severity calcification of the thoracic aorta. Normal heart size.  No pericardial effusion. Mediastinum/Nodes: There is no evidence  of hilar, mediastinal or axillary lymphadenopathy. A stable 3.6 cm thyroid nodule is seen along the inferior aspect of the right lobe. Lungs/Pleura: Mild scarring and/or atelectasis is seen within the bilateral apices. There is mild to moderate severity bilateral emphysematous lung disease. A stable ill-defined 6 mm noncalcified lung nodule is seen within the lateral aspect of the left upper lobe (axial CT image 22, CT series number 3). The ill-defined subcentimeter noncalcified lung nodule seen within the posterior aspect of the right lower lobe on the prior study is no longer visualized. Very mild atelectasis is seen along the posterior aspect of the right lung base. There is no evidence of a pleural effusion or pneumothorax. Upper Abdomen: No acute abnormality. Musculoskeletal: Multilevel degenerative changes seen throughout the thoracic spine. IMPRESSION: 1. Stable 6 mm noncalcified lung nodule within the left upper lobe. 2. 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 3. Stable 3.6 cm thyroid nodule along the inferior aspect of the right lobe. 4. Emphysema and aortic atherosclerosis. Aortic Atherosclerosis (ICD10-I70.0) and Emphysema (ICD10-J43.9). Electronically Signed   By: Virgina Norfolk M.D.   On: 12/06/2019 15:45    Assessment and plan- Patient is a 79 y.o. female who presents to pulmonary nodule clinic for assessment and review of recent CT scan.   CT chest from 12/06/2019 reveals a stable 6 mm noncalcified lung nodule within the left upper lobe.  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  Stable 3.6 cm thyroid nodule along the end inferior aspect of the right lobe.  Emphysema and aortic sclerosis.  Given she had fairly significant growth of left upper lobe pulmonary nodule last year, would recommend 1 additional CT chest without contrast in 1 year to assess stability.  Patient in agreement  with plan.  Calculating malignancy probability of a pulmonary nodule: Risk factors include: 1. Age. 2. Cancer history. 3. Diameter of pulmonary nodule and mm 4. Location 5. Smoking history 6. Spiculation present  Based on risk factors, this patient is high risk for the development of lung cancer.    I recommend she return to clinic in approximately 1 year for CT scan without contrast.  If imaging is stable at that time, patient likely can be discharged from our clinic and referred back to her PCP.  During our visit, we discussed pulmonary nodules are a common incidental finding and are often how lung cancer is discovered. Lung cancer survival is directly related to the stage at diagnosis. We discussed that nodules can vary in presentation from solitary pulmonary nodules to masses, 2 groundglass opacities and multiple nodules. Pulmonary nodules in the majority of cases are benign but the probability of these becoming malignant cannot be undermined. Early identification of malignant nodules could lead to early diagnosis and increased survival.  We discussed the probability of pulmonary nodules becoming malignant increase with age, pack years of tobacco use, size/characteristics of the nodule and location; with upper lobe involvement being most worrisome.  We discussed the goal of our clinic is to thoroughly evaluate each nodule, developed a comprehensive, individualized plan of care utilizing the most advanced technology and significantly reduce the time from detection to treatment.A dedicated pulmonary nodule clinic has proven to indeed expedite the detection and treatment of lung cancer.  Patient education in fact sheet provided along with most recent CT scans.  Plan: Review most recent CT chest.   Disposition:  Return to clinic in approximately 1 year for repeat imaging with CT chest without contrast.   Visit Diagnosis 1.  Personal history of tobacco use, presenting  hazards to health   2. Lung nodule    Patient expressed understanding and was in agreement with this plan. She also understands that She can call clinic at any time with any questions, concerns, or complaints.   Greater than 50% was spent in counseling and coordination of care with this patient including but not limited to discussion of the relevant topics above (See A&P) including, but not limited to diagnosis and management of acute and chronic medical conditions.   Thank you for allowing me to participate in the care of this very pleasant patient.   Jacquelin Hawking, NP [place] [facility] Cell - [phone] Pager- [phone] 12/10/2019 1:13 PM CC: Dr. Army Melia

## 2019-12-25 ENCOUNTER — Encounter: Payer: Self-pay | Admitting: *Deleted

## 2020-01-24 DIAGNOSIS — M25511 Pain in right shoulder: Secondary | ICD-10-CM | POA: Insufficient documentation

## 2020-01-24 DIAGNOSIS — M25512 Pain in left shoulder: Secondary | ICD-10-CM | POA: Insufficient documentation

## 2020-01-31 DIAGNOSIS — H401431 Capsular glaucoma with pseudoexfoliation of lens, bilateral, mild stage: Secondary | ICD-10-CM | POA: Diagnosis not present

## 2020-01-31 DIAGNOSIS — H2513 Age-related nuclear cataract, bilateral: Secondary | ICD-10-CM | POA: Diagnosis not present

## 2020-02-07 DIAGNOSIS — M48062 Spinal stenosis, lumbar region with neurogenic claudication: Secondary | ICD-10-CM | POA: Diagnosis not present

## 2020-02-07 DIAGNOSIS — M25511 Pain in right shoulder: Secondary | ICD-10-CM | POA: Diagnosis not present

## 2020-02-07 DIAGNOSIS — M5416 Radiculopathy, lumbar region: Secondary | ICD-10-CM | POA: Diagnosis not present

## 2020-02-07 DIAGNOSIS — M5136 Other intervertebral disc degeneration, lumbar region: Secondary | ICD-10-CM | POA: Diagnosis not present

## 2020-02-07 DIAGNOSIS — M25512 Pain in left shoulder: Secondary | ICD-10-CM | POA: Diagnosis not present

## 2020-02-14 DIAGNOSIS — M7541 Impingement syndrome of right shoulder: Secondary | ICD-10-CM | POA: Diagnosis not present

## 2020-02-14 DIAGNOSIS — M25512 Pain in left shoulder: Secondary | ICD-10-CM | POA: Diagnosis not present

## 2020-02-14 DIAGNOSIS — M25511 Pain in right shoulder: Secondary | ICD-10-CM | POA: Diagnosis not present

## 2020-02-14 DIAGNOSIS — M7552 Bursitis of left shoulder: Secondary | ICD-10-CM | POA: Diagnosis not present

## 2020-02-14 DIAGNOSIS — M7542 Impingement syndrome of left shoulder: Secondary | ICD-10-CM | POA: Diagnosis not present

## 2020-03-13 DIAGNOSIS — M25512 Pain in left shoulder: Secondary | ICD-10-CM | POA: Diagnosis not present

## 2020-03-13 DIAGNOSIS — M7541 Impingement syndrome of right shoulder: Secondary | ICD-10-CM | POA: Diagnosis not present

## 2020-03-13 DIAGNOSIS — M25511 Pain in right shoulder: Secondary | ICD-10-CM | POA: Diagnosis not present

## 2020-03-13 DIAGNOSIS — M7552 Bursitis of left shoulder: Secondary | ICD-10-CM | POA: Diagnosis not present

## 2020-03-13 DIAGNOSIS — M7551 Bursitis of right shoulder: Secondary | ICD-10-CM | POA: Diagnosis not present

## 2020-03-13 DIAGNOSIS — M7542 Impingement syndrome of left shoulder: Secondary | ICD-10-CM | POA: Diagnosis not present

## 2020-03-14 ENCOUNTER — Other Ambulatory Visit: Payer: Self-pay | Admitting: Sports Medicine

## 2020-03-14 DIAGNOSIS — M7552 Bursitis of left shoulder: Secondary | ICD-10-CM

## 2020-03-14 DIAGNOSIS — M25512 Pain in left shoulder: Secondary | ICD-10-CM

## 2020-03-14 DIAGNOSIS — M7542 Impingement syndrome of left shoulder: Secondary | ICD-10-CM

## 2020-03-27 ENCOUNTER — Ambulatory Visit (INDEPENDENT_AMBULATORY_CARE_PROVIDER_SITE_OTHER): Payer: Medicare Other | Admitting: Internal Medicine

## 2020-03-27 ENCOUNTER — Other Ambulatory Visit: Payer: Self-pay

## 2020-03-27 ENCOUNTER — Encounter: Payer: Self-pay | Admitting: Internal Medicine

## 2020-03-27 VITALS — BP 130/66 | HR 85 | Temp 97.9°F | Ht 66.0 in | Wt 175.0 lb

## 2020-03-27 DIAGNOSIS — M25512 Pain in left shoulder: Secondary | ICD-10-CM

## 2020-03-27 DIAGNOSIS — I10 Essential (primary) hypertension: Secondary | ICD-10-CM

## 2020-03-27 DIAGNOSIS — Z23 Encounter for immunization: Secondary | ICD-10-CM

## 2020-03-27 DIAGNOSIS — G8929 Other chronic pain: Secondary | ICD-10-CM | POA: Diagnosis not present

## 2020-03-27 DIAGNOSIS — M25511 Pain in right shoulder: Secondary | ICD-10-CM | POA: Diagnosis not present

## 2020-03-27 NOTE — Progress Notes (Signed)
Date:  03/27/2020   Name:  Monique Hall   DOB:  September 10, 1940   MRN:  [id number]   Chief Complaint: Hypertension (follow up/flu shot )  Hypertension This is a chronic problem. The problem is controlled. Pertinent negatives include no chest pain, headaches or shortness of breath. Past treatments include ACE inhibitors and diuretics. The current treatment provides significant improvement.    Lab Results  Component Value Date   CREATININE 0.99 09/20/2019   BUN 18 09/20/2019   NA 140 09/20/2019   K 4.8 09/20/2019   CL 102 09/20/2019   CO2 21 09/20/2019   Lab Results  Component Value Date   CHOL 268 (H) 09/20/2019   HDL 61 09/20/2019   LDLCALC 162 (H) 09/20/2019   TRIG 242 (H) 09/20/2019   CHOLHDL 4.4 09/20/2019   Lab Results  Component Value Date   TSH 1.980 09/20/2019   No results found for: HGBA1C Lab Results  Component Value Date   WBC 8.0 09/20/2019   HGB 12.5 09/20/2019   HCT 36.7 09/20/2019   MCV 92 09/20/2019   PLT 422 09/20/2019   Lab Results  Component Value Date   ALT 21 09/20/2019   AST 20 09/20/2019   ALKPHOS 44 09/20/2019   BILITOT 0.6 09/20/2019     Review of Systems  Constitutional: Negative for diaphoresis and fever.  Respiratory: Negative for cough, chest tightness, shortness of breath and wheezing.   Cardiovascular: Negative for chest pain and leg swelling.  Musculoskeletal: Positive for arthralgias (shoulder pain L>R).  Neurological: Negative for dizziness and headaches.    Patient Active Problem List   Diagnosis Date Noted  . Aortic atherosclerosis ([place]) 09/15/2018  . Centrilobular emphysema ([place]) 09/15/2018  . Coronary artery disease 09/15/2018  . Thyroid nodule 10/06/2017  . Gastroesophageal reflux disease without esophagitis 09/02/2016  . Essential hypertension 07/29/2015  . Mixed hyperlipidemia 07/29/2015  . Menopausal symptoms 07/29/2015  . Tobacco use disorder, moderate, in sustained remission 07/29/2015  . Degeneration of  intervertebral disc of lumbar region 06/17/2015  . 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 Sacroiliac strain 01/28/2015    Allergies  Allergen Reactions  . Baclofen     Other reaction(s): Unknown  . Etodolac Nausea Only    Past Surgical History:  Procedure Laterality Date  . ABDOMINAL HYSTERECTOMY  1985   total for bleeding  . APPENDECTOMY    . BIOPSY THYROID  2019  . BLADDER SUSPENSION  2004  . lipoma excision  03/2018   posterior neck    Social History   Tobacco Use  . Smoking status: Former Smoker    Packs/day: 1.00    Years: 35.00    Pack years: 35.00    Types: Cigarettes    Quit date: 09/01/2002    Years since quitting: 17.5  . Smokeless tobacco: Never Used  . Tobacco comment: smoking cessation materials not required  Vaping Use  . Vaping Use: Never used  Substance Use Topics  . Alcohol use: No    Alcohol/week: 0.0 standard drinks  . Drug use: No     Medication list has been reviewed and updated.  Current Meds  Medication Sig  . ACETAMINOPHEN PO Take by mouth as needed. For shoulder pain  . aspirin EC 81 MG tablet Take 81 mg by mouth daily.  . Black Cohosh 200 MG CAPS Take 1 tablet by mouth daily. Reported on 08/20/2015  . Black Elderberry (SAMBUCUS ELDERBERRY PO) Take by mouth.  Chew two gummies daily  . ezetimibe (ZETIA) 10 MG tablet TAKE 1 TABLET EVERY DAY  . loratadine (CLARITIN) 10 MG tablet Take 10 mg by mouth daily.  Marland Kitchen losartan-hydrochlorothiazide (HYZAAR) 100-12.5 MG tablet TAKE 1 TABLET EVERY DAY  . triamcinolone (NASACORT ALLERGY 24HR) 55 MCG/ACT AERO nasal inhaler Place 1 spray into the nose at bedtime. 1 spray each nostril QHS    PHQ 2/9 Scores 09/20/2019 09/17/2019 09/15/2018 09/11/2018  PHQ - 2 Score 0 0 0 0  PHQ- 9 Score - - 0 -    No flowsheet data found.  BP Readings from Last 3 Encounters:  03/27/20 130/66  09/20/19 136/76  09/17/19 127/70    Physical Exam Constitutional:      Appearance:  Normal appearance.  Neck:     Vascular: No carotid bruit.  Cardiovascular:     Rate and Rhythm: Normal rate and regular rhythm.     Pulses: Normal pulses.     Heart sounds: No murmur heard.   Pulmonary:     Effort: Pulmonary effort is normal. No respiratory distress.     Breath sounds: No stridor. No wheezing or rhonchi.  Musculoskeletal:     Cervical back: Normal range of motion.     Right lower leg: No edema.     Left lower leg: No edema.  Lymphadenopathy:     Cervical: No cervical adenopathy.  Skin:    General: Skin is warm.  Neurological:     General: No focal deficit present.     Mental Status: She is alert.  Psychiatric:        Mood and Affect: Mood normal.        Behavior: Behavior normal.     Wt Readings from Last 3 Encounters:  03/27/20 175 lb (79.4 kg)  09/20/19 175 lb (79.4 kg)  09/17/19 168 lb (76.2 kg)    BP 130/66   Pulse 85   Temp 97.9 F (36.6 C) (Oral)   Ht 5\' 6"  (1.676 m)   Wt 175 lb (79.4 kg)   SpO2 94%   BMI 28.25 kg/m   Assessment and Plan: 1. Essential hypertension Clinically stable exam with well controlled BP. Tolerating medications without side effects at this time. Pt to continue current regimen and low sodium diet; benefits of regular exercise as able discussed.  2. Chronic pain of both shoulders Seeing Ortho; s/p injections with little benefit MRI pending  3. Need for immunization against influenza - Flu Vaccine QUAD High Dose(Fluad)   Partially dictated using Editor, commissioning. Any errors are unintentional.  Halina Maidens, MD [facility]  03/27/2020

## 2020-04-03 ENCOUNTER — Ambulatory Visit
Admission: RE | Admit: 2020-04-03 | Discharge: 2020-04-03 | Disposition: A | Discharge status: Home / Self Care | Payer: Medicare Other | Source: Ambulatory Visit | Attending: Sports Medicine | Admitting: Sports Medicine

## 2020-04-03 ENCOUNTER — Other Ambulatory Visit: Payer: Self-pay

## 2020-04-03 DIAGNOSIS — S43402A Unspecified sprain of left shoulder joint, initial encounter: Secondary | ICD-10-CM | POA: Diagnosis not present

## 2020-04-03 DIAGNOSIS — M7552 Bursitis of left shoulder: Secondary | ICD-10-CM | POA: Diagnosis not present

## 2020-04-03 DIAGNOSIS — M7542 Impingement syndrome of left shoulder: Secondary | ICD-10-CM | POA: Diagnosis not present

## 2020-04-03 DIAGNOSIS — M19012 Primary osteoarthritis, left shoulder: Secondary | ICD-10-CM | POA: Diagnosis not present

## 2020-04-03 DIAGNOSIS — M25512 Pain in left shoulder: Secondary | ICD-10-CM | POA: Diagnosis not present

## 2020-04-03 DIAGNOSIS — M6258 Muscle wasting and atrophy, not elsewhere classified, other site: Secondary | ICD-10-CM | POA: Diagnosis not present

## 2020-04-03 DIAGNOSIS — M25511 Pain in right shoulder: Secondary | ICD-10-CM | POA: Insufficient documentation

## 2020-04-03 DIAGNOSIS — M75122 Complete rotator cuff tear or rupture of left shoulder, not specified as traumatic: Secondary | ICD-10-CM | POA: Diagnosis not present

## 2020-04-10 DIAGNOSIS — M75122 Complete rotator cuff tear or rupture of left shoulder, not specified as traumatic: Secondary | ICD-10-CM | POA: Diagnosis not present

## 2020-05-07 DIAGNOSIS — M75122 Complete rotator cuff tear or rupture of left shoulder, not specified as traumatic: Secondary | ICD-10-CM | POA: Diagnosis not present

## 2020-05-07 DIAGNOSIS — M25512 Pain in left shoulder: Secondary | ICD-10-CM | POA: Diagnosis not present

## 2020-05-07 DIAGNOSIS — M6281 Muscle weakness (generalized): Secondary | ICD-10-CM | POA: Diagnosis not present

## 2020-05-07 DIAGNOSIS — M25612 Stiffness of left shoulder, not elsewhere classified: Secondary | ICD-10-CM | POA: Diagnosis not present

## 2020-06-30 ENCOUNTER — Other Ambulatory Visit: Payer: Self-pay | Admitting: Internal Medicine

## 2020-06-30 DIAGNOSIS — I10 Essential (primary) hypertension: Secondary | ICD-10-CM

## 2020-06-30 DIAGNOSIS — E782 Mixed hyperlipidemia: Secondary | ICD-10-CM

## 2020-06-30 NOTE — Telephone Encounter (Signed)
Requested Prescriptions  Pending Prescriptions Disp Refills   losartan-hydrochlorothiazide (HYZAAR) 100-12.5 MG tablet [Pharmacy Med Name: LOSARTAN POTASSIUM/HYDROCHLOROTHIAZIDE 100-12.5 MG Tablet] 90 tablet 0    Sig: TAKE 1 TABLET EVERY DAY     Cardiovascular: ARB + Diuretic Combos Failed - 06/30/2020 11:41 AM      Failed - K in normal range and within 180 days    Potassium  Date Value Ref Range Status  09/20/2019 4.8 3.5 - 5.2 mmol/L Final         Failed - Na in normal range and within 180 days    Sodium  Date Value Ref Range Status  09/20/2019 140 134 - 144 mmol/L Final         Failed - Cr in normal range and within 180 days    Creatinine, Ser  Date Value Ref Range Status  09/20/2019 0.99 0.57 - 1.00 mg/dL Final         Failed - Ca in normal range and within 180 days    Calcium  Date Value Ref Range Status  09/20/2019 10.5 (H) 8.7 - 10.3 mg/dL Final         Passed - Patient is not pregnant      Passed - Last BP in normal range    BP Readings from Last 1 Encounters:  03/27/20 130/66         Passed - Valid encounter within last 6 months    Recent Outpatient Visits          3 months ago Essential hypertension   [facility] Glean Hess, MD   9 months ago Essential hypertension   [facility] Glean Hess, MD   1 year ago Essential hypertension   [facility] Glean Hess, MD   2 years ago Sebaceous cyst   [facility] Glean Hess, MD   2 years ago Essential hypertension   [place], Laura H, MD      Future Appointments            In 2 months Glean Hess, MD [facility]            ezetimibe (ZETIA) 10 MG tablet [Pharmacy Med Name: EZETIMIBE 10 MG Tablet] 90 tablet 0    Sig: TAKE 1 TABLET EVERY DAY     Cardiovascular:  Antilipid - Sterol Transport Inhibitors Failed - 06/30/2020 11:41 AM      Failed - Total Cholesterol in normal range and within 360 days     Cholesterol, Total  Date Value Ref Range Status  09/20/2019 268 (H) 100 - 199 mg/dL Final         Failed - LDL in normal range and within 360 days    LDL Chol Calc (NIH)  Date Value Ref Range Status  09/20/2019 162 (H) 0 - 99 mg/dL Final         Failed - Triglycerides in normal range and within 360 days    Triglycerides  Date Value Ref Range Status  09/20/2019 242 (H) 0 - 149 mg/dL Final         Passed - HDL in normal range and within 360 days    HDL  Date Value Ref Range Status  09/20/2019 61 >39 mg/dL Final         Passed - Valid encounter within last 12 months    Recent Outpatient Visits          3 months ago  Essential hypertension   [facility] Glean Hess, MD   9 months ago Essential hypertension   [facility] Glean Hess, MD   1 year ago Essential hypertension   [facility] Glean Hess, MD   2 years ago Sebaceous cyst   [facility] Glean Hess, MD   2 years ago Essential hypertension   [facility] Glean Hess, MD      Future Appointments            In 2 months Army Melia Jesse Sans, MD [facility]

## 2020-07-17 DIAGNOSIS — M75122 Complete rotator cuff tear or rupture of left shoulder, not specified as traumatic: Secondary | ICD-10-CM | POA: Diagnosis not present

## 2020-07-30 DIAGNOSIS — M5136 Other intervertebral disc degeneration, lumbar region: Secondary | ICD-10-CM | POA: Diagnosis not present

## 2020-07-30 DIAGNOSIS — M48062 Spinal stenosis, lumbar region with neurogenic claudication: Secondary | ICD-10-CM | POA: Diagnosis not present

## 2020-07-30 DIAGNOSIS — M5416 Radiculopathy, lumbar region: Secondary | ICD-10-CM | POA: Diagnosis not present

## 2020-08-06 ENCOUNTER — Other Ambulatory Visit: Payer: Self-pay | Admitting: Internal Medicine

## 2020-08-06 DIAGNOSIS — Z1231 Encounter for screening mammogram for malignant neoplasm of breast: Secondary | ICD-10-CM

## 2020-08-07 DIAGNOSIS — J432 Centrilobular emphysema: Secondary | ICD-10-CM | POA: Diagnosis not present

## 2020-08-07 DIAGNOSIS — I2510 Atherosclerotic heart disease of native coronary artery without angina pectoris: Secondary | ICD-10-CM | POA: Diagnosis not present

## 2020-08-07 DIAGNOSIS — I10 Essential (primary) hypertension: Secondary | ICD-10-CM | POA: Diagnosis not present

## 2020-08-07 DIAGNOSIS — I700 Atherosclerosis of aorta: Secondary | ICD-10-CM | POA: Diagnosis not present

## 2020-08-07 DIAGNOSIS — F17201 Nicotine dependence, unspecified, in remission: Secondary | ICD-10-CM | POA: Diagnosis not present

## 2020-08-07 DIAGNOSIS — E782 Mixed hyperlipidemia: Secondary | ICD-10-CM | POA: Diagnosis not present

## 2020-08-22 DIAGNOSIS — M48062 Spinal stenosis, lumbar region with neurogenic claudication: Secondary | ICD-10-CM | POA: Diagnosis not present

## 2020-08-22 DIAGNOSIS — M5416 Radiculopathy, lumbar region: Secondary | ICD-10-CM | POA: Diagnosis not present

## 2020-08-22 DIAGNOSIS — M5136 Other intervertebral disc degeneration, lumbar region: Secondary | ICD-10-CM | POA: Diagnosis not present

## 2020-09-22 ENCOUNTER — Other Ambulatory Visit: Payer: Self-pay

## 2020-09-22 ENCOUNTER — Ambulatory Visit
Admission: RE | Admit: 2020-09-22 | Discharge: 2020-09-22 | Disposition: A | Discharge status: Home / Self Care | Payer: Medicare Other | Source: Ambulatory Visit | Attending: Internal Medicine | Admitting: Internal Medicine

## 2020-09-22 DIAGNOSIS — Z1231 Encounter for screening mammogram for malignant neoplasm of breast: Secondary | ICD-10-CM | POA: Diagnosis not present

## 2020-09-24 ENCOUNTER — Ambulatory Visit (INDEPENDENT_AMBULATORY_CARE_PROVIDER_SITE_OTHER): Payer: Medicare Other

## 2020-09-24 ENCOUNTER — Other Ambulatory Visit: Payer: Self-pay | Admitting: Internal Medicine

## 2020-09-24 DIAGNOSIS — R928 Other abnormal and inconclusive findings on diagnostic imaging of breast: Secondary | ICD-10-CM

## 2020-09-24 DIAGNOSIS — Z0000 Encounter for general adult medical examination without abnormal findings: Secondary | ICD-10-CM | POA: Diagnosis not present

## 2020-09-24 DIAGNOSIS — N6489 Other specified disorders of breast: Secondary | ICD-10-CM

## 2020-09-24 NOTE — Patient Instructions (Signed)
Monique Hall , Thank you for taking time to come for your Medicare Wellness Visit. I appreciate your ongoing commitment to your health goals. Please review the following plan we discussed and let me know if I can assist you in the future.   Screening recommendations/referrals: Colonoscopy: no longer required Mammogram: done 09/22/20 Bone Density: done 2010 Recommended yearly ophthalmology/optometry visit for glaucoma screening and checkup Recommended yearly dental visit for hygiene and checkup  Vaccinations: Influenza vaccine: done 03/27/20 Pneumococcal vaccine: done 06/24/14 Tdap vaccine: done 08/29/15 Shingles vaccine: Shingrix discussed. Please contact your pharmacy for coverage information.  Covid-19: done 04/10/20 & 05/01/20  Advanced directives: Please bring a copy of your health care power of attorney and living will to the office at your convenience once you have completed that paperwork   Conditions/risks identified: Recommend increasing physical activity as tolerated.   Next appointment: Follow up in one year for your annual wellness visit    Preventive Care 65 Years and Older, Female Preventive care refers to lifestyle choices and visits with your health care provider that can promote health and wellness. What does preventive care include?  A yearly physical exam. This is also called an annual well check.  Dental exams once or twice a year.  Routine eye exams. Ask your health care provider how often you should have your eyes checked.  Personal lifestyle choices, including:  Daily care of your teeth and gums.  Regular physical activity.  Eating a healthy diet.  Avoiding tobacco and drug use.  Limiting alcohol use.  Practicing safe sex.  Taking low-dose aspirin every day.  Taking vitamin and mineral supplements as recommended by your health care provider. What happens during an annual well check? The services and screenings done by your health care provider during  your annual well check will depend on your age, overall health, lifestyle risk factors, and family history of disease. Counseling  Your health care provider may ask you questions about your:  Alcohol use.  Tobacco use.  Drug use.  Emotional well-being.  Home and relationship well-being.  Sexual activity.  Eating habits.  History of falls.  Memory and ability to understand (cognition).  Work and work Statistician.  Reproductive health. Screening  You may have the following tests or measurements:  Height, weight, and BMI.  Blood pressure.  Lipid and cholesterol levels. These may be checked every 5 years, or more frequently if you are over 45 years old.  Skin check.  Lung cancer screening. You may have this screening every year starting at age 70 if you have a 30-pack-year history of smoking and currently smoke or have quit within the past 15 years.  Fecal occult blood test (FOBT) of the stool. You may have this test every year starting at age 8.  Flexible sigmoidoscopy or colonoscopy. You may have a sigmoidoscopy every 5 years or a colonoscopy every 10 years starting at age 32.  Hepatitis C blood test.  Hepatitis B blood test.  Sexually transmitted disease (STD) testing.  Diabetes screening. This is done by checking your blood sugar (glucose) after you have not eaten for a while (fasting). You may have this done every 1-3 years.  Bone density scan. This is done to screen for osteoporosis. You may have this done starting at age 14.  Mammogram. This may be done every 1-2 years. Talk to your health care provider about how often you should have regular mammograms. Talk with your health care provider about your test results, treatment options, and if  necessary, the need for more tests. Vaccines  Your health care provider may recommend certain vaccines, such as:  Influenza vaccine. This is recommended every year.  Tetanus, diphtheria, and acellular pertussis (Tdap,  Td) vaccine. You may need a Td booster every 10 years.  Zoster vaccine. You may need this after age 52.  Pneumococcal 13-valent conjugate (PCV13) vaccine. One dose is recommended after age 20.  Pneumococcal polysaccharide (PPSV23) vaccine. One dose is recommended after age 31. Talk to your health care provider about which screenings and vaccines you need and how often you need them. This information is not intended to replace advice given to you by your health care provider. Make sure you discuss any questions you have with your health care provider. Document Released: 08/01/2015 Document Revised: 03/24/2016 Document Reviewed: 05/06/2015 Elsevier Interactive Patient Education  2017 [place] Prevention in the Home Falls can cause injuries. They can happen to people of all ages. There are many things you can do to make your home safe and to help prevent falls. What can I do on the outside of my home?  Regularly fix the edges of walkways and driveways and fix any cracks.  Remove anything that might make you trip as you walk through a door, such as a raised step or threshold.  Trim any bushes or trees on the path to your home.  Use bright outdoor lighting.  Clear any walking paths of anything that might make someone trip, such as rocks or tools.  Regularly check to see if handrails are loose or broken. Make sure that both sides of any steps have handrails.  Any raised decks and porches should have guardrails on the edges.  Have any leaves, snow, or ice cleared regularly.  Use sand or salt on walking paths during winter.  Clean up any spills in your garage right away. This includes oil or grease spills. What can I do in the bathroom?  Use night lights.  Install grab bars by the toilet and in the tub and shower. Do not use towel bars as grab bars.  Use non-skid mats or decals in the tub or shower.  If you need to sit down in the shower, use a plastic, non-slip  stool.  Keep the floor dry. Clean up any water that spills on the floor as soon as it happens.  Remove soap buildup in the tub or shower regularly.  Attach bath mats securely with double-sided non-slip rug tape.  Do not have throw rugs and other things on the floor that can make you trip. What can I do in the bedroom?  Use night lights.  Make sure that you have a light by your bed that is easy to reach.  Do not use any sheets or blankets that are too big for your bed. They should not hang down onto the floor.  Have a firm chair that has side arms. You can use this for support while you get dressed.  Do not have throw rugs and other things on the floor that can make you trip. What can I do in the kitchen?  Clean up any spills right away.  Avoid walking on wet floors.  Keep items that you use a lot in easy-to-reach places.  If you need to reach something above you, use a strong step stool that has a grab bar.  Keep electrical cords out of the way.  Do not use floor polish or wax that makes floors slippery. If you must  use wax, use non-skid floor wax.  Do not have throw rugs and other things on the floor that can make you trip. What can I do with my stairs?  Do not leave any items on the stairs.  Make sure that there are handrails on both sides of the stairs and use them. Fix handrails that are broken or loose. Make sure that handrails are as long as the stairways.  Check any carpeting to make sure that it is firmly attached to the stairs. Fix any carpet that is loose or worn.  Avoid having throw rugs at the top or bottom of the stairs. If you do have throw rugs, attach them to the floor with carpet tape.  Make sure that you have a light switch at the top of the stairs and the bottom of the stairs. If you do not have them, ask someone to add them for you. What else can I do to help prevent falls?  Wear shoes that:  Do not have high heels.  Have rubber bottoms.  Are  comfortable and fit you well.  Are closed at the toe. Do not wear sandals.  If you use a stepladder:  Make sure that it is fully opened. Do not climb a closed stepladder.  Make sure that both sides of the stepladder are locked into place.  Ask someone to hold it for you, if possible.  Clearly mark and make sure that you can see:  Any grab bars or handrails.  First and last steps.  Where the edge of each step is.  Use tools that help you move around (mobility aids) if they are needed. These include:  Canes.  Walkers.  Scooters.  Crutches.  Turn on the lights when you go into a dark area. Replace any light bulbs as soon as they burn out.  Set up your furniture so you have a clear path. Avoid moving your furniture around.  If any of your floors are uneven, fix them.  If there are any pets around you, be aware of where they are.  Review your medicines with your doctor. Some medicines can make you feel dizzy. This can increase your chance of falling. Ask your doctor what other things that you can do to help prevent falls. This information is not intended to replace advice given to you by your health care provider. Make sure you discuss any questions you have with your health care provider. Document Released: 05/01/2009 Document Revised: 12/11/2015 Document Reviewed: 08/09/2014 Elsevier Interactive Patient Education  2017 [employer].

## 2020-09-24 NOTE — Progress Notes (Signed)
Subjective:   Monique Hall is a 80 y.o. female who presents for Medicare Annual (Subsequent) preventive examination.  Virtual Visit via Telephone Note  I connected with  Monique Hall on 09/24/20 at  3:20 PM EST by telephone and verified that I am speaking with the correct person using two identifiers.  Location: Patient: home Provider: [facility] Persons participating in the virtual visit: [place]   I discussed the limitations, risks, security and privacy concerns of performing an evaluation and management service by telephone and the availability of in person appointments. The patient expressed understanding and agreed to proceed.  Interactive audio and video telecommunications were attempted between this nurse and patient, however failed, due to patient having technical difficulties OR patient did not have access to video capability.  We continued and completed visit with audio only.  Some vital signs may be absent or patient reported.   Clemetine Marker, LPN    Review of Systems     Cardiac Risk Factors include: advanced age (>66men, >57 women);dyslipidemia;hypertension     Objective:    There were no vitals filed for this visit. There is no height or weight on file to calculate BMI.  Advanced Directives 09/24/2020 09/17/2019 09/11/2018 09/05/2017 09/02/2016 04/10/2016 08/29/2015  Does Patient Have a Medical Advance Directive? No No No No No No No  Would patient like information on creating a medical advance directive? No - Patient declined No - Patient declined No - Patient declined Yes (MAU/Ambulatory/Procedural Areas - Information given) - No - patient declined information No - patient declined information    Current Medications (verified) Outpatient Encounter Medications as of 09/24/2020  Medication Sig  . ACETAMINOPHEN PO Take by mouth as needed. For shoulder pain  . aspirin EC 81 MG tablet Take 81 mg by mouth daily.  . Black Cohosh 200 MG CAPS Take 1 tablet by  mouth daily. Reported on 08/20/2015  . Black Elderberry (SAMBUCUS ELDERBERRY PO) Take by mouth. Chew two gummies daily  . ezetimibe (ZETIA) 10 MG tablet TAKE 1 TABLET EVERY DAY  . loratadine (CLARITIN) 10 MG tablet Take 10 mg by mouth daily.  Marland Kitchen losartan-hydrochlorothiazide (HYZAAR) 100-12.5 MG tablet TAKE 1 TABLET EVERY DAY  . triamcinolone (NASACORT) 55 MCG/ACT AERO nasal inhaler Place 1 spray into the nose at bedtime. 1 spray each nostril QHS   No facility-administered encounter medications on file as of 09/24/2020.    Allergies (verified) Baclofen and Etodolac   History: Past Medical History:  Diagnosis Date  . Allergy   . Arthritis   . Emphysema lung ([place])   . Esophagitis   . Gastritis   . Gastroesophageal reflux disease without esophagitis   . Hypercholesteremia   . Hypertension   . Lipoma of neck 04/27/2018  . Sciatica   . Tendonitis, Achilles 2016   both   . Thyroid nodule 2019   Past Surgical History:  Procedure Laterality Date  . ABDOMINAL HYSTERECTOMY  1985   total for bleeding  . APPENDECTOMY    . BIOPSY THYROID  2019  . BLADDER SUSPENSION  2004  . lipoma excision  03/2018   posterior neck   Family History  Problem Relation Age of Onset  . CAD Mother   . Diabetes Mother   . Arthritis Mother   . Hypertension Mother   . Parkinson's disease Father   . COPD Father   . Hearing loss Father   . Breast cancer Neg Hx    Social History   Socioeconomic History  .  Marital status: Widowed    Spouse name: Not on file  . Number of children: 3  . Years of education: Not on file  . Highest education level: 12th grade  Occupational History  . Occupation: Retired  Tobacco Use  . Smoking status: Former Smoker    Packs/day: 1.00    Years: 35.00    Pack years: 35.00    Types: Cigarettes    Quit date: 09/01/2002    Years since quitting: 18.0  . Smokeless tobacco: Never Used  . Tobacco comment: smoking cessation materials not required  Vaping Use  . Vaping Use:  Never used  Substance and Sexual Activity  . Alcohol use: No    Alcohol/week: 0.0 standard drinks  . Drug use: No  . Sexual activity: Not Currently  Other Topics Concern  . Not on file  Social History Narrative  . Not on file   Social Determinants of Health   Financial Resource Strain: Low Risk   . Difficulty of Paying Living Expenses: Not very hard  Food Insecurity: No Food Insecurity  . Worried About Charity fundraiser in the Last Year: Never true  . Ran Out of Food in the Last Year: Never true  Transportation Needs: No Transportation Needs  . Lack of Transportation (Medical): No  . Lack of Transportation (Non-Medical): No  Physical Activity: Inactive  . Days of Exercise per Week: 0 days  . Minutes of Exercise per Session: 0 min  Stress: No Stress Concern Present  . Feeling of Stress : Not at all  Social Connections: Moderately Isolated  . Frequency of Communication with Friends and Family: More than three times a week  . Frequency of Social Gatherings with Friends and Family: More than three times a week  . Attends Religious Services: More than 4 times per year  . Active Member of Clubs or Organizations: No  . Attends Archivist Meetings: Never  . Marital Status: Widowed    Tobacco Counseling Counseling given: Not Answered Comment: smoking cessation materials not required   Clinical Intake:  Pre-visit preparation completed: Yes  Pain : No/denies pain     Diabetes: No  How often do you need to have someone help you when you read instructions, pamphlets, or other written materials from your doctor or pharmacy?: 1 - Never    Interpreter Needed?: No  Information entered by :: Clemetine Marker LPN   Activities of Daily Living In your present state of health, do you have any difficulty performing the following activities: 09/24/2020  Hearing? Y  Comment wears hearing aids  Vision? N  Difficulty concentrating or making decisions? N  Walking or climbing  stairs? N  Dressing or bathing? N  Doing errands, shopping? N  Preparing Food and eating ? N  Using the Toilet? N  In the past six months, have you accidently leaked urine? N  Do you have problems with loss of bowel control? N  Managing your Medications? N  Managing your Finances? N  Housekeeping or managing your Housekeeping? N  Some recent data might be hidden    [name], [name], MD as Referring Physician (Physical Medicine and Rehabilitation)  Indicate any recent Medical Services you may have received from other than [facility] providers in the past year (date may be approximate).     Assessment:   This is a routine wellness examination for [facility].  Hearing/Vision screen  Hearing Screening  125Hz  250Hz  500Hz  1000Hz  2000Hz  3000Hz  4000Hz  6000Hz  8000Hz   Right ear:           Left ear:           Comments: Patient wears hearing aids maintained by [facility]  Vision Screening Comments: Dr. Ellin Mayhew for annual eye exams  Dietary issues and exercise activities discussed: Current Exercise Habits: The patient does not participate in regular exercise at present, Exercise limited by: orthopedic condition(s)  Goals    . DIET - INCREASE WATER INTAKE     Recommend to drink at least 6-8 8oz glasses of water per day.      Depression Screen PHQ 2/9 Scores 09/24/2020 09/20/2019 09/17/2019 09/15/2018 09/11/2018 09/05/2017 09/05/2017  PHQ - 2 Score 0 0 0 0 0 0 0  PHQ- 9 Score - - - 0 - 0 -    Fall Risk Fall Risk  09/24/2020 09/20/2019 09/17/2019 09/15/2018 09/11/2018  Falls in the past year? 0 0 0 0 0  Number falls in past yr: 0 0 0 0 0  Injury with Fall? 0 0 0 0 0  Risk for fall due to : No Fall Risks - Orthopedic patient - -  Follow up Falls prevention discussed Falls evaluation completed Falls prevention discussed - Falls prevention discussed    FALL RISK PREVENTION PERTAINING TO THE HOME:  Any stairs in or around the  home? No  If so, are there any without handrails? No  Home free of loose throw rugs in walkways, pet beds, electrical cords, etc? Yes  Adequate lighting in your home to reduce risk of falls? Yes   ASSISTIVE DEVICES UTILIZED TO PREVENT FALLS:  Life alert? No  Use of a cane, walker or w/c? No  Grab bars in the bathroom? Yes  Shower chair or bench in shower? No  Elevated toilet seat or a handicapped toilet? No   TIMED UP AND GO:  Was the test performed? No . Telephonic visit.   Cognitive Function: Normal cognitive status assessed by direct observation by this Nurse Health Advisor. No abnormalities found.       6CIT Screen 09/20/2019 09/17/2019 09/11/2018 09/05/2017 09/02/2016  What Year? 0 points 0 points 0 points 0 points 0 points  What month? 0 points 0 points 0 points 0 points 0 points  What time? 0 points 0 points 0 points 0 points 0 points  Count back from 20 0 points 0 points 0 points 0 points 0 points  Months in reverse 0 points 0 points 0 points 0 points 0 points  Repeat phrase 2 points 0 points 0 points 6 points 0 points  Total Score 2 0 0 6 0    Immunizations Immunization History  Administered Date(s) Administered  . Fluad Quad(high Dose 65+) 03/27/2020  . Influenza-Unspecified 04/28/2017, 05/11/2018, 05/11/2019  . PFIZER(Purple Top)SARS-COV-2 Vaccination 04/10/2020, 05/01/2020  . Pneumococcal Conjugate-13 06/24/2014  . Pneumococcal Polysaccharide-23 07/21/2011  . Tdap 07/20/2005, 08/29/2015  . Zoster 07/20/2000    TDAP status: Up to date  Flu Vaccine status: Up to date  Pneumococcal vaccine status: Up to date  Covid-19 vaccine status: Completed vaccines  Qualifies for Shingles Vaccine? Yes   Zostavax completed Yes   Shingrix Completed?: No.    Education has been provided regarding the importance of this vaccine. Patient has been advised to call insurance company to determine out of pocket expense if they have not yet received this vaccine. Advised may also  receive vaccine at local pharmacy or Health Dept. Verbalized acceptance and  understanding.  Screening Tests Health Maintenance  Topic Date Due  . Hepatitis C Screening  Never done  . COVID-19 Vaccine (3 - Booster for Pfizer series) 10/30/2020  . MAMMOGRAM  09/22/2021  . TETANUS/TDAP  08/28/2025  . INFLUENZA VACCINE  Completed  . PNA vac Low Risk Adult  Completed  . DEXA SCAN  Addressed  . HPV VACCINES  Aged Out    Health Maintenance  Health Maintenance Due  Topic Date Due  . Hepatitis C Screening  Never done    Colorectal cancer screening: No longer required.   Mammogram status: Completed 09/22/20. Repeat every year  Bone Density status: Completed 2010. Results reflect: Bone density results: OSTEOPENIA. Repeat every 2 years. Patient declines repeat screening at this time.   Lung Cancer Screening: (Low Dose CT Chest recommended if Age 58-80 years, 30 pack-year currently smoking OR have quit w/in 15years.) does not qualify. Follow by lung nodule clinic. Chest CT scheduled for 12/05/20.   Additional Screening:  Hepatitis C Screening: does qualify; postponed  Vision Screening: Recommended annual ophthalmology exams for early detection of glaucoma and other disorders of the eye. Is the patient up to date with their annual eye exam?  Yes  Who is the provider or what is the name of the office in which the patient attends annual eye exams? [place] Screening: Recommended annual dental exams for proper oral hygiene  Community Resource Referral / Chronic Care Management: CRR required this visit?  No   CCM required this visit?  No      Plan:     I have personally reviewed and noted the following in the patient's chart:   . Medical and social history . Use of alcohol, tobacco or illicit drugs  . Current medications and supplements . Functional ability and status . Nutritional status . Physical activity . Advanced directives . List of other  physicians . Hospitalizations, surgeries, and ER visits in previous 12 months . Vitals . Screenings to include cognitive, depression, and falls . Referrals and appointments  In addition, I have reviewed and discussed with patient certain preventive protocols, quality metrics, and best practice recommendations. A written personalized care plan for preventive services as well as general preventive health recommendations were provided to patient.     Clemetine Marker, LPN   11/21/3746   Nurse Notes: pt scheduled for CPE tomorrow.

## 2020-09-25 ENCOUNTER — Ambulatory Visit (INDEPENDENT_AMBULATORY_CARE_PROVIDER_SITE_OTHER): Payer: Medicare Other | Admitting: Internal Medicine

## 2020-09-25 ENCOUNTER — Other Ambulatory Visit: Payer: Self-pay

## 2020-09-25 ENCOUNTER — Encounter: Payer: Self-pay | Admitting: Internal Medicine

## 2020-09-25 VITALS — BP 118/64 | HR 89 | Temp 97.7°F | Ht 66.0 in | Wt 176.0 lb

## 2020-09-25 DIAGNOSIS — E782 Mixed hyperlipidemia: Secondary | ICD-10-CM | POA: Diagnosis not present

## 2020-09-25 DIAGNOSIS — K219 Gastro-esophageal reflux disease without esophagitis: Secondary | ICD-10-CM | POA: Diagnosis not present

## 2020-09-25 DIAGNOSIS — Z1159 Encounter for screening for other viral diseases: Secondary | ICD-10-CM | POA: Diagnosis not present

## 2020-09-25 DIAGNOSIS — I25118 Atherosclerotic heart disease of native coronary artery with other forms of angina pectoris: Secondary | ICD-10-CM

## 2020-09-25 DIAGNOSIS — I10 Essential (primary) hypertension: Secondary | ICD-10-CM | POA: Diagnosis not present

## 2020-09-25 DIAGNOSIS — M5116 Intervertebral disc disorders with radiculopathy, lumbar region: Secondary | ICD-10-CM | POA: Diagnosis not present

## 2020-09-25 DIAGNOSIS — I700 Atherosclerosis of aorta: Secondary | ICD-10-CM

## 2020-09-25 DIAGNOSIS — J432 Centrilobular emphysema: Secondary | ICD-10-CM | POA: Diagnosis not present

## 2020-09-25 LAB — POCT URINALYSIS DIPSTICK
Bilirubin, UA: NEGATIVE
Blood, UA: NEGATIVE
Glucose, UA: NEGATIVE
Ketones, UA: NEGATIVE
Leukocytes, UA: NEGATIVE
Nitrite, UA: NEGATIVE
Protein, UA: NEGATIVE
Spec Grav, UA: 1.01 (ref 1.010–1.025)
Urobilinogen, UA: 0.2 E.U./dL
pH, UA: 6 (ref 5.0–8.0)

## 2020-09-25 NOTE — Progress Notes (Signed)
Date:  09/25/2020   Name:  Monique Hall   DOB:  30-Apr-1941   MRN:  [id number]   Chief Complaint: Annual Exam (No breast exam no pap)  Monique Hall is a 80 y.o. female who presents today for her Complete Annual Exam. She feels well. She reports exercising arm excercises. She reports she is sleeping fairly well. Breast complaints none. She is sitting for her 8 y/o grandson who keeps her running.  She is moving soon to her daughter's property [place] and is looking forward to it.  Mammogram: 09/2020 - left breast asymmetry  DEXA: 12/2008 Colonoscopy: aged out - last 2012  Immunization History  Administered Date(s) Administered  . Fluad Quad(high Dose 65+) 03/27/2020  . Influenza-Unspecified 04/28/2017, 05/11/2018, 05/11/2019  . PFIZER(Purple Top)SARS-COV-2 Vaccination 04/10/2020, 05/01/2020  . Pneumococcal Conjugate-13 06/24/2014  . Pneumococcal Polysaccharide-23 07/21/2011  . Tdap 07/20/2005, 08/29/2015  . Zoster 07/20/2000    Hypertension This is a chronic problem. The problem is controlled. Pertinent negatives include no chest pain, headaches, palpitations or shortness of breath. Past treatments include angiotensin blockers and diuretics. The current treatment provides significant improvement. There are no compliance problems.  Hypertensive end-organ damage includes CAD/MI.  Hyperlipidemia This is a chronic problem. The problem is uncontrolled. Recent lipid tests were reviewed and are high. Pertinent negatives include no chest pain or shortness of breath. Current antihyperlipidemic treatment includes ezetimibe. The current treatment provides mild improvement of lipids.  Gastroesophageal Reflux She reports no abdominal pain, no chest pain, no coughing or no wheezing. Pertinent negatives include no fatigue.  CAD - followed by Dr. Ubaldo Glassing. Per his last note: Coronary artery disease involving native heart without angina pectoris, unspecified vessel or lesion type  -Detected by  CT scan, no evidence of reversible ischemic on stress test -Continue aspirin 81mg  daily and Zetia 10mg  daily     Lab Results  Component Value Date   CREATININE 0.99 09/20/2019   BUN 18 09/20/2019   NA 140 09/20/2019   K 4.8 09/20/2019   CL 102 09/20/2019   CO2 21 09/20/2019   Lab Results  Component Value Date   CHOL 268 (H) 09/20/2019   HDL 61 09/20/2019   LDLCALC 162 (H) 09/20/2019   TRIG 242 (H) 09/20/2019   CHOLHDL 4.4 09/20/2019   Lab Results  Component Value Date   TSH 1.980 09/20/2019   No results found for: HGBA1C Lab Results  Component Value Date   WBC 8.0 09/20/2019   HGB 12.5 09/20/2019   HCT 36.7 09/20/2019   MCV 92 09/20/2019   PLT 422 09/20/2019   Lab Results  Component Value Date   ALT 21 09/20/2019   AST 20 09/20/2019   ALKPHOS 44 09/20/2019   BILITOT 0.6 09/20/2019     Review of Systems  Constitutional: Negative for chills, fatigue and fever.  HENT: Negative for congestion, hearing loss, tinnitus, trouble swallowing and voice change.   Eyes: Negative for visual disturbance.  Respiratory: Negative for cough, chest tightness, shortness of breath and wheezing.   Cardiovascular: Negative for chest pain, palpitations and leg swelling.  Gastrointestinal: Negative for abdominal pain, constipation, diarrhea and vomiting.  Endocrine: Negative for polydipsia and polyuria.  Genitourinary: Negative for dysuria, frequency, genital sores, vaginal bleeding and vaginal discharge.  Musculoskeletal: Positive for back pain (spinal stenosis - injections from Dr. Sharlet Salina). Negative for arthralgias (both shoulders have rotator cuff injuries), gait problem and joint swelling.  Skin: Negative for color change and rash.  Neurological: Negative  for dizziness, tremors, light-headedness and headaches.  Hematological: Negative for adenopathy. Does not bruise/bleed easily.  Psychiatric/Behavioral: Negative for dysphoric mood and sleep disturbance. The patient is not  nervous/anxious.     Patient Active Problem List   Diagnosis Date Noted  . Shoulder pain, bilateral 01/24/2020  . Aortic atherosclerosis ([place]) 09/15/2018  . Centrilobular emphysema ([place]) 09/15/2018  . Coronary artery disease 09/15/2018  . Thyroid nodule 10/06/2017  . Gastroesophageal reflux disease without esophagitis 09/02/2016  . Essential hypertension 07/29/2015  . Mixed hyperlipidemia 07/29/2015  . Menopausal symptoms 07/29/2015  . Tobacco use disorder, moderate, in sustained remission 07/29/2015  . Degeneration of intervertebral disc of lumbar region 06/17/2015  . 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 Sacroiliac strain 01/28/2015    Allergies  Allergen Reactions  . Baclofen     Other reaction(s): Unknown  . Etodolac Nausea Only    Past Surgical History:  Procedure Laterality Date  . ABDOMINAL HYSTERECTOMY  1985   total for bleeding  . APPENDECTOMY    . BIOPSY THYROID  2019  . BLADDER SUSPENSION  2004  . lipoma excision  03/2018   posterior neck    Social History   Tobacco Use  . Smoking status: Former Smoker    Packs/day: 1.00    Years: 35.00    Pack years: 35.00    Types: Cigarettes    Quit date: 09/01/2002    Years since quitting: 18.0  . Smokeless tobacco: Never Used  . Tobacco comment: smoking cessation materials not required  Vaping Use  . Vaping Use: Never used  Substance Use Topics  . Alcohol use: No    Alcohol/week: 0.0 standard drinks  . Drug use: No     Medication list has been reviewed and updated.  Current Meds  Medication Sig  . ACETAMINOPHEN PO Take by mouth as needed. For shoulder pain  . aspirin EC 81 MG tablet Take 81 mg by mouth daily.  . Black Cohosh 200 MG CAPS Take 1 tablet by mouth daily. Reported on 08/20/2015  . Black Elderberry (SAMBUCUS ELDERBERRY PO) Take by mouth. Chew two gummies daily  . ezetimibe (ZETIA) 10 MG tablet TAKE 1 TABLET EVERY DAY  . loratadine (CLARITIN) 10 MG  tablet Take 10 mg by mouth daily.  Marland Kitchen losartan-hydrochlorothiazide (HYZAAR) 100-12.5 MG tablet TAKE 1 TABLET EVERY DAY  . triamcinolone (NASACORT) 55 MCG/ACT AERO nasal inhaler Place 1 spray into the nose at bedtime. 1 spray each nostril QHS    PHQ 2/9 Scores 09/25/2020 09/24/2020 09/20/2019 09/17/2019  PHQ - 2 Score 0 0 0 0  PHQ- 9 Score 0 - - -    GAD 7 : Generalized Anxiety Score 09/25/2020  Nervous, Anxious, on Edge 0  Control/stop worrying 0  Worry too much - different things 0  Trouble relaxing 0  Restless 0  Easily annoyed or irritable 0  Afraid - awful might happen 0  Total GAD 7 Score 0    BP Readings from Last 3 Encounters:  09/25/20 118/64  03/27/20 130/66  09/20/19 136/76    Physical Exam Vitals and nursing note reviewed.  Constitutional:      General: She is not in acute distress.    Appearance: She is well-developed.  HENT:     Head: Normocephalic and atraumatic.     Right Ear: Tympanic membrane and ear canal normal.     Left Ear: Tympanic membrane and ear canal normal.     Nose:  Right Sinus: No maxillary sinus tenderness.     Left Sinus: No maxillary sinus tenderness.  Eyes:     General: No scleral icterus.       Right eye: No discharge.        Left eye: No discharge.     Conjunctiva/sclera: Conjunctivae normal.  Neck:     Thyroid: No thyromegaly.     Vascular: No carotid bruit.  Cardiovascular:     Rate and Rhythm: Normal rate and regular rhythm.     Pulses: Normal pulses.     Heart sounds: Normal heart sounds.  Pulmonary:     Effort: Pulmonary effort is normal. No respiratory distress.     Breath sounds: No wheezing.  Abdominal:     General: Bowel sounds are normal.     Palpations: Abdomen is soft.     Tenderness: There is no abdominal tenderness.  Musculoskeletal:     Cervical back: Normal range of motion. No erythema.     Right lower leg: No edema.     Left lower leg: No edema.  Lymphadenopathy:     Cervical: No cervical adenopathy.   Skin:    General: Skin is warm and dry.     Capillary Refill: Capillary refill takes less than 2 seconds.     Findings: No rash.  Neurological:     General: No focal deficit present.     Mental Status: She is alert and oriented to person, place, and time.     Cranial Nerves: No cranial nerve deficit.     Sensory: No sensory deficit.     Deep Tendon Reflexes: Reflexes are normal and symmetric.  Psychiatric:        Attention and Perception: Attention normal.        Mood and Affect: Mood normal.     Wt Readings from Last 3 Encounters:  09/25/20 176 lb (79.8 kg)  03/27/20 175 lb (79.4 kg)  09/20/19 175 lb (79.4 kg)    BP 118/64   Pulse 89   Temp 97.7 F (36.5 C) (Oral)   Ht 5\' 6"  (1.676 m)   Wt 176 lb (79.8 kg)   SpO2 95%   BMI 28.41 kg/m   Assessment and Plan: 1. Essential hypertension Clinically stable exam with well controlled BP. Tolerating medications without side effects at this time. Pt to continue current regimen and low sodium diet; benefits of regular exercise as able discussed. - Comprehensive metabolic panel - TSH - POCT urinalysis dipstick  2. Centrilobular emphysema ([place]) Seen on CT screening and nodule follow up Quit smoking in 2004 No active symptoms and not on any medication.  3. Mixed hyperlipidemia Intolerant to multiple statins Continue Zetia - Lipid panel  4. Gastroesophageal reflux disease without esophagitis Symptoms well controlled with only intermittent sx No red flag signs such as weight loss, n/v, melena Will continue prn TUMS. - CBC with Differential/Platelet  5. Need for hepatitis C screening test - Hepatitis C antibody  6. Aortic atherosclerosis (HCC) On zetia and aspirin  7. Atherosclerotic heart disease of native coronary artery with other forms of angina pectoris ([place]) Stable without CP or SOB. Followed by Cardiology.   Partially dictated using Editor, commissioning. Any errors are unintentional.  Halina Maidens,  MD [facility]  09/25/2020

## 2020-09-26 LAB — COMPREHENSIVE METABOLIC PANEL
ALT: 18 IU/L (ref 0–32)
AST: 18 IU/L (ref 0–40)
Albumin/Globulin Ratio: 1.6 (ref 1.2–2.2)
Albumin: 4.4 g/dL (ref 3.7–4.7)
Alkaline Phosphatase: 48 IU/L (ref 44–121)
BUN/Creatinine Ratio: 17 (ref 12–28)
BUN: 19 mg/dL (ref 8–27)
Bilirubin Total: 0.9 mg/dL (ref 0.0–1.2)
CO2: 21 mmol/L (ref 20–29)
Calcium: 10.1 mg/dL (ref 8.7–10.3)
Chloride: 101 mmol/L (ref 96–106)
Creatinine, Ser: 1.12 mg/dL — ABNORMAL HIGH (ref 0.57–1.00)
Globulin, Total: 2.7 g/dL (ref 1.5–4.5)
Glucose: 106 mg/dL — ABNORMAL HIGH (ref 65–99)
Potassium: 4.3 mmol/L (ref 3.5–5.2)
Sodium: 139 mmol/L (ref 134–144)
Total Protein: 7.1 g/dL (ref 6.0–8.5)
eGFR: 50 mL/min/{1.73_m2} — ABNORMAL LOW (ref >59)

## 2020-09-26 LAB — CBC WITH DIFFERENTIAL/PLATELET
Basophils Absolute: 0.1 10*3/uL (ref 0.0–0.2)
Basos: 2 %
EOS (ABSOLUTE): 0.3 10*3/uL (ref 0.0–0.4)
Eos: 4 %
Hematocrit: 37.1 % (ref 34.0–46.6)
Hemoglobin: 12.4 g/dL (ref 11.1–15.9)
Immature Grans (Abs): 0 10*3/uL (ref 0.0–0.1)
Immature Granulocytes: 0 %
Lymphocytes Absolute: 2.8 10*3/uL (ref 0.7–3.1)
Lymphs: 36 %
MCH: 29.7 pg (ref 26.6–33.0)
MCHC: 33.4 g/dL (ref 31.5–35.7)
MCV: 89 fL (ref 79–97)
Monocytes Absolute: 0.7 10*3/uL (ref 0.1–0.9)
Monocytes: 9 %
Neutrophils Absolute: 3.9 10*3/uL (ref 1.4–7.0)
Neutrophils: 49 %
Platelets: 414 10*3/uL (ref 150–450)
RBC: 4.17 x10E6/uL (ref 3.77–5.28)
RDW: 13.5 % (ref 11.7–15.4)
WBC: 7.8 10*3/uL (ref 3.4–10.8)

## 2020-09-26 LAB — LIPID PANEL
Chol/HDL Ratio: 5.1 ratio — ABNORMAL HIGH (ref 0.0–4.4)
Cholesterol, Total: 265 mg/dL — ABNORMAL HIGH (ref 100–199)
HDL: 52 mg/dL (ref >39)
LDL Chol Calc (NIH): 167 mg/dL — ABNORMAL HIGH (ref 0–99)
Triglycerides: 249 mg/dL — ABNORMAL HIGH (ref 0–149)
VLDL Cholesterol Cal: 46 mg/dL — ABNORMAL HIGH (ref 5–40)

## 2020-09-26 LAB — TSH: TSH: 2.82 u[IU]/mL (ref 0.450–4.500)

## 2020-09-26 LAB — HEPATITIS C ANTIBODY: Hep C Virus Ab: 0.2 s/co ratio (ref 0.0–0.9)

## 2020-10-03 ENCOUNTER — Other Ambulatory Visit: Payer: Self-pay

## 2020-10-03 ENCOUNTER — Ambulatory Visit
Admission: RE | Admit: 2020-10-03 | Discharge: 2020-10-03 | Disposition: A | Discharge status: Home / Self Care | Payer: Medicare Other | Source: Ambulatory Visit | Attending: Internal Medicine | Admitting: Internal Medicine

## 2020-10-03 DIAGNOSIS — R928 Other abnormal and inconclusive findings on diagnostic imaging of breast: Secondary | ICD-10-CM | POA: Diagnosis not present

## 2020-10-03 DIAGNOSIS — N6489 Other specified disorders of breast: Secondary | ICD-10-CM | POA: Insufficient documentation

## 2020-10-03 DIAGNOSIS — R922 Inconclusive mammogram: Secondary | ICD-10-CM | POA: Diagnosis not present

## 2020-10-06 ENCOUNTER — Other Ambulatory Visit: Payer: Self-pay | Admitting: Internal Medicine

## 2020-10-06 DIAGNOSIS — R928 Other abnormal and inconclusive findings on diagnostic imaging of breast: Secondary | ICD-10-CM

## 2020-10-06 DIAGNOSIS — N6320 Unspecified lump in the left breast, unspecified quadrant: Secondary | ICD-10-CM

## 2020-10-09 HISTORY — PX: BREAST BIOPSY: SHX20

## 2020-10-14 ENCOUNTER — Other Ambulatory Visit: Payer: Self-pay | Admitting: Internal Medicine

## 2020-10-14 ENCOUNTER — Ambulatory Visit
Admission: RE | Admit: 2020-10-14 | Discharge: 2020-10-14 | Disposition: A | Discharge status: Home / Self Care | Payer: Medicare Other | Source: Ambulatory Visit | Attending: Internal Medicine | Admitting: Internal Medicine

## 2020-10-14 ENCOUNTER — Other Ambulatory Visit: Payer: Self-pay

## 2020-10-14 DIAGNOSIS — R928 Other abnormal and inconclusive findings on diagnostic imaging of breast: Secondary | ICD-10-CM | POA: Insufficient documentation

## 2020-10-14 DIAGNOSIS — C50212 Malignant neoplasm of upper-inner quadrant of left female breast: Secondary | ICD-10-CM | POA: Diagnosis not present

## 2020-10-14 DIAGNOSIS — C50412 Malignant neoplasm of upper-outer quadrant of left female breast: Secondary | ICD-10-CM | POA: Diagnosis not present

## 2020-10-14 DIAGNOSIS — E782 Mixed hyperlipidemia: Secondary | ICD-10-CM

## 2020-10-14 DIAGNOSIS — N6320 Unspecified lump in the left breast, unspecified quadrant: Secondary | ICD-10-CM | POA: Diagnosis not present

## 2020-10-14 DIAGNOSIS — I10 Essential (primary) hypertension: Secondary | ICD-10-CM

## 2020-10-16 ENCOUNTER — Encounter: Payer: Self-pay | Admitting: *Deleted

## 2020-10-16 DIAGNOSIS — C50912 Malignant neoplasm of unspecified site of left female breast: Secondary | ICD-10-CM

## 2020-10-16 NOTE — Progress Notes (Signed)
Received message from Electa Sniff, RN that patient has been informed of her new diagnosis of left breast cancer.   Called patient to introduce to navigation services.  States she would like to see a Psychologist, sport and exercise at [facility].  I have scheduled patient to see Dr. Lysle Pearl on Monday, 10/20/20 @ 9:30 and Dr. Grayland Ormond wit medical oncology on Thursday 10/23/20 @ 1:30.  Will give educational material at that time.  Patient was encouraged to call with any questions or needs.

## 2020-10-20 ENCOUNTER — Ambulatory Visit: Payer: Self-pay | Admitting: Surgery

## 2020-10-20 ENCOUNTER — Other Ambulatory Visit: Payer: Self-pay | Admitting: Surgery

## 2020-10-20 DIAGNOSIS — C50212 Malignant neoplasm of upper-inner quadrant of left female breast: Secondary | ICD-10-CM | POA: Diagnosis not present

## 2020-10-20 NOTE — H&P (View-Only) (Signed)
Subjective:   CC: Malignant neoplasm of upper-inner quadrant of left female breast, unspecified estrogen receptor status (CMS-HCC) [C50.212] HPI:  Monique Hall is a 80 y.o. female who was referred by [name], * for evaluation of above. Change was noted on last screening mammogram. Patient does routinely do self breast exams.  Patient has not noted a change on breast exam. Age of menarche was 53. Age of menopause was 73. Hysterectomy. Patient reports hormonal therapy. Patient is G3P3. Age of first live birth was 19. Patient did not breast feed. Patient denies nipple discharge. Patient denies previous breast biopsy. Patient denies a personal history of breast cancer.   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  Past Surgical History:  has a past surgical history that includes HYSTERECTOMY; Appendectomy; [facility] Bladder Sling; Appendectomy (1969); and Hysterectomy (1980).  Family History: family history includes COPD in her father; Diabetes type II in her mother; High blood pressure (Hypertension) in her mother; Hyperlipidemia (Elevated cholesterol) in her mother; Myocardial Infarction (Heart attack) in her sister; Rheum arthritis in her mother; Ulcers in her father.  Social History:  reports that she quit smoking about 18 years ago. Her smoking use included cigarettes. She has a 40.00 pack-year smoking history. She has never used smokeless tobacco. She reports current alcohol use. She reports that she does not use drugs.  Current Medications: has a current medication list which includes the following prescription(s): acetaminophen, aspirin, black cohosh, elderberry fruit, ezetimibe, ibuprofen, loratadine, losartan-hydrochlorothiazide, triamcinolone, and  prednisone.  Allergies:  Allergies as of 10/20/2020 - Reviewed 10/20/2020  Allergen Reaction Noted  . Baclofen Unknown 02/04/2015  . Etodolac Other (See Comments) 02/04/2015    ROS:  A 15 point review of systems was performed and was negative except as noted in HPI   Objective:     BP (!) 153/89   Pulse 84   Ht 166.4 cm (5' 5.5")   Wt 80.3 kg (177 lb)   LMP  (LMP Unknown)   BMI 29.01 kg/m   Constitutional :  alert, appears stated age, cooperative and no distress  Lymphatics/Throat:  no asymmetry, masses, or scars  Respiratory:  clear to auscultation bilaterally  Cardiovascular:  regular rate and rhythm  Gastrointestinal: soft, non-tender; bowel sounds normal; no masses,  no organomegaly.   Musculoskeletal: Steady gait and movement  Skin: Cool and moist.  Psychiatric: Normal affect, non-agitated, not confused  Breast:  Chaperone present for exam.  breasts appear normal, no suspicious masses, no skin or nipple changes or axillary nodes.    LABS:  SURGICAL PATHOLOGY  SURGICAL PATHOLOGY  CASE: [id number]  PATIENT: [facility]  Surgical Pathology Report      Specimen Submitted:  A. Breast, left   Clinical History: 3 mm irregular mass, concerning for malignancy; Q  tissue marker clip was deployed       DIAGNOSIS:  A. BREAST, LEFT 11:00 6 CM FN; ULTRASOUND-GUIDED BIOPSY:  - INVASIVE MAMMARY CARCINOMA, NO SPECIAL TYPE.   Size of invasive carcinoma: 5 mm in this sample  Histologic grade of invasive carcinoma: Grade 1            Glandular/tubular differentiation score: 2            Nuclear pleomorphism score: 2            Mitotic rate score: 1  Total score: 5  Ductal carcinoma in situ: Not identified  Lymphovascular invasion: Not identified   ER/PR/HER2:Immunohistochemistry will be performed on block A1, with  reflex to [place] for HER2 2+. The results will be reported in an addendum.   GROSS DESCRIPTION:   A. Labeled: Left breast 11:00 6 cm from nipple  Received: Formalin  Time/date in fixative: Collected and placed in formalin at 1:27 PM on  10/14/2020  Cold ischemic time: Less than 1 minute  Total fixation time: 27.5 hours  Core pieces: 3 cores and 1 additional fragment  Size: Range from 1.5-1.9 cm in length and 0.2 cm in diameter  Description: Received are cores of yellow fibrofatty tissue. The  additional fragment is comprised of brown soft tissue, 0.2 x 0.1 x 0.1  cm.  Ink color: Blue  Entirely submitted in cassettes 1-2 with 2 cores in cassette 1 and 1  bisected core with the remaining fragment in cassette 2.     RB 10/15/2020   Final Diagnosis performed by Quay Burow, MD.  Electronically signed  10/16/2020 10:20:11AM  The electronic signature indicates that the named Attending Pathologist  has evaluated the specimen  Technical component performed at [facility], [address], [place],  [place] [address] Lab: [phone] Dir: Rush Farmer, MD, MMM  Professional component performed at [facility], [facility], [place], [place], [place] [address] Lab: [phone]  Dir: Dellia Nims. Reuel Derby, MD      RADS: CLINICAL DATA: 80 year old female presenting as a recall from  screening for possible left breast mass.   EXAM:  DIGITAL DIAGNOSTIC UNILATERAL LEFT MAMMOGRAM WITH TOMOSYNTHESIS AND  CAD; ULTRASOUND LEFT BREAST LIMITED   TECHNIQUE:  Left digital diagnostic mammography and breast tomosynthesis was  performed. The images were evaluated with computer-aided detection.;  Targeted ultrasound examination of the left breast was performed   COMPARISON: Previous exam(s).   ACR Breast Density Category b: There are scattered areas of  fibroglandular density.   FINDINGS:  Mammogram:   Spot compression tomosynthesis views of the left breast were  performed. There is persistence of a possible small obscured mass  measuring 3 mm in the upper slightly  inner aspect of the breast.   Ultrasound:   Targeted ultrasound performed in the left breast at 11 o'clock 6 cm  from the nipple demonstrating an irregular isoechoic mass measuring  0.3 x 0.3 x 0.3 cm. No internal vascularity. This likely corresponds  to the small mass identified mammographically. Targeted ultrasound  of the left axilla demonstrates normal lymph nodes.   IMPRESSION:  Indeterminate 3 mm mass in the left breast at 11 o'clock.   RECOMMENDATION:  Ultrasound-guided core needle biopsy of the left breast mass at 11  o'clock.   I have discussed the findings and recommendations with the patient  who agrees to proceed with biopsy. The patient will be contacted by  our scheduler to arrange the biopsy appointment.   BI-RADS CATEGORY 4: Suspicious.    Electronically Signed  By: Audie Pinto M.D.  On: 10/03/2020 11:17  CLINICAL DATA: 80 year old female presenting as a recall from  screening for possible left breast mass.   EXAM:  DIGITAL DIAGNOSTIC UNILATERAL LEFT MAMMOGRAM WITH TOMOSYNTHESIS AND  CAD; ULTRASOUND LEFT BREAST LIMITED   TECHNIQUE:  Left digital diagnostic mammography and breast tomosynthesis was  performed. The images were evaluated with computer-aided detection.;  Targeted ultrasound examination of the left breast was performed   COMPARISON: Previous exam(s).   ACR Breast Density Category b: There are  scattered areas of  fibroglandular density.   FINDINGS:  Mammogram:   Spot compression tomosynthesis views of the left breast were  performed. There is persistence of a possible small obscured mass  measuring 3 mm in the upper slightly inner aspect of the breast.   Ultrasound:   Targeted ultrasound performed in the left breast at 11 o'clock 6 cm  from the nipple demonstrating an irregular isoechoic mass measuring  0.3 x 0.3 x 0.3 cm. No internal vascularity. This likely corresponds  to the small mass identified mammographically.  Targeted ultrasound  of the left axilla demonstrates normal lymph nodes.   IMPRESSION:  Indeterminate 3 mm mass in the left breast at 11 o'clock.   RECOMMENDATION:  Ultrasound-guided core needle biopsy of the left breast mass at 11  o'clock.   I have discussed the findings and recommendations with the patient  who agrees to proceed with biopsy. The patient will be contacted by  our scheduler to arrange the biopsy appointment.   BI-RADS CATEGORY 4: Suspicious.    Electronically Signed  By: Audie Pinto M.D.  On: 10/03/2020 11:17   Assessment:   Malignant neoplasm of upper-inner quadrant of left female breast, unspecified estrogen receptor status (CMS-HCC) [C50.212]  Plan:     1. Malignant neoplasm of upper-inner quadrant of left female breast, unspecified estrogen receptor status (CMS-HCC) [C50.212]  Discussed the risk of surgery including recurrence, chronic pain, post-op infxn, poor/delayed wound healing, poor cosmesis, seroma, hematoma formation, and possible re-operation to address said risks. The risks of general anesthetic, if used, includes MI, CVA, sudden death or even reaction to anesthetic medications also discussed.  Typical post-op recovery time and possbility of activity restrictions were also discussed.  Alternatives include continued observation.  Benefits include possible symptom relief, pathologic evaluation, and/or curative excision.   The patient verbalized understanding and all questions were answered to the patient's satisfaction.  2. Patient has elected to proceed with surgical treatment. Procedure will be scheduled. Left lumpectomy, SLNB, RF tag placement.  Written consent was obtained.

## 2020-10-20 NOTE — H&P (Signed)
Subjective:   CC: Malignant neoplasm of upper-inner quadrant of left female breast, unspecified estrogen receptor status (CMS-HCC) [C50.212] HPI:  Monique Hall is a 80 y.o. female who was referred by [name], * for evaluation of above. Change was noted on last screening mammogram. Patient does routinely do self breast exams.  Patient has not noted a change on breast exam. Age of menarche was 89. Age of menopause was 49. Hysterectomy. Patient reports hormonal therapy. Patient is G3P3. Age of first live birth was 47. Patient did not breast feed. Patient denies nipple discharge. Patient denies previous breast biopsy. Patient denies a personal history of breast cancer.   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  Past Surgical History:  has a past surgical history that includes HYSTERECTOMY; Appendectomy; Monarch Bladder Sling; Appendectomy (1969); and Hysterectomy (1980).  Family History: family history includes COPD in her father; Diabetes type II in her mother; High blood pressure (Hypertension) in her mother; Hyperlipidemia (Elevated cholesterol) in her mother; Myocardial Infarction (Heart attack) in her sister; Rheum arthritis in her mother; Ulcers in her father.  Social History:  reports that she quit smoking about 18 years ago. Her smoking use included cigarettes. She has a 40.00 pack-year smoking history. She has never used smokeless tobacco. She reports current alcohol use. She reports that she does not use drugs.  Current Medications: has a current medication list which includes the following prescription(s): acetaminophen, aspirin, black cohosh, elderberry fruit, ezetimibe, ibuprofen, loratadine, losartan-hydrochlorothiazide, triamcinolone, and  prednisone.  Allergies:  Allergies as of 10/20/2020 - Reviewed 10/20/2020  Allergen Reaction Noted  . Baclofen Unknown 02/04/2015  . Etodolac Other (See Comments) 02/04/2015    ROS:  A 15 point review of systems was performed and was negative except as noted in HPI   Objective:     BP (!) 153/89   Pulse 84   Ht 166.4 cm (5' 5.5")   Wt 80.3 kg (177 lb)   LMP  (LMP Unknown)   BMI 29.01 kg/m   Constitutional :  alert, appears stated age, cooperative and no distress  Lymphatics/Throat:  no asymmetry, masses, or scars  Respiratory:  clear to auscultation bilaterally  Cardiovascular:  regular rate and rhythm  Gastrointestinal: soft, non-tender; bowel sounds normal; no masses,  no organomegaly.   Musculoskeletal: Steady gait and movement  Skin: Cool and moist.  Psychiatric: Normal affect, non-agitated, not confused  Breast:  Chaperone present for exam.  breasts appear normal, no suspicious masses, no skin or nipple changes or axillary nodes.    LABS:  SURGICAL PATHOLOGY  SURGICAL PATHOLOGY  CASE: [id number]  PATIENT: [facility]  Surgical Pathology Report      Specimen Submitted:  A. Breast, left   Clinical History: 3 mm irregular mass, concerning for malignancy; Q  tissue marker clip was deployed       DIAGNOSIS:  A. BREAST, LEFT 11:00 6 CM FN; ULTRASOUND-GUIDED BIOPSY:  - INVASIVE MAMMARY CARCINOMA, NO SPECIAL TYPE.   Size of invasive carcinoma: 5 mm in this sample  Histologic grade of invasive carcinoma: Grade 1            Glandular/tubular differentiation score: 2            Nuclear pleomorphism score: 2            Mitotic rate score: 1  Total score: 5  Ductal carcinoma in situ: Not identified  Lymphovascular invasion: Not identified   ER/PR/HER2:Immunohistochemistry will be performed on block A1, with  reflex to [place] for HER2 2+. The results will be reported in an addendum.   GROSS DESCRIPTION:   A. Labeled: Left breast 11:00 6 cm from nipple  Received: Formalin  Time/date in fixative: Collected and placed in formalin at 1:27 PM on  10/14/2020  Cold ischemic time: Less than 1 minute  Total fixation time: 27.5 hours  Core pieces: 3 cores and 1 additional fragment  Size: Range from 1.5-1.9 cm in length and 0.2 cm in diameter  Description: Received are cores of yellow fibrofatty tissue. The  additional fragment is comprised of brown soft tissue, 0.2 x 0.1 x 0.1  cm.  Ink color: Blue  Entirely submitted in cassettes 1-2 with 2 cores in cassette 1 and 1  bisected core with the remaining fragment in cassette 2.     RB 10/15/2020   Final Diagnosis performed by Quay Burow, MD.  Electronically signed  10/16/2020 10:20:11AM  The electronic signature indicates that the named Attending Pathologist  has evaluated the specimen  Technical component performed at [facility], [address], [place],  [place] [address] Lab: [id number] Dir: Rush Farmer, MD, MMM  Professional component performed at [facility], [facility], [place], [place], [place] [address] Lab: [phone]  Dir: Dellia Nims. Reuel Derby, MD      RADS: CLINICAL DATA: 80 year old female presenting as a recall from  screening for possible left breast mass.   EXAM:  DIGITAL DIAGNOSTIC UNILATERAL LEFT MAMMOGRAM WITH TOMOSYNTHESIS AND  CAD; ULTRASOUND LEFT BREAST LIMITED   TECHNIQUE:  Left digital diagnostic mammography and breast tomosynthesis was  performed. The images were evaluated with computer-aided detection.;  Targeted ultrasound examination of the left breast was performed   COMPARISON: Previous exam(s).   ACR Breast Density Category b: There are scattered areas of  fibroglandular density.   FINDINGS:  Mammogram:   Spot compression tomosynthesis views of the left breast were  performed. There is persistence of a possible small obscured mass  measuring 3 mm in the upper slightly  inner aspect of the breast.   Ultrasound:   Targeted ultrasound performed in the left breast at 11 o'clock 6 cm  from the nipple demonstrating an irregular isoechoic mass measuring  0.3 x 0.3 x 0.3 cm. No internal vascularity. This likely corresponds  to the small mass identified mammographically. Targeted ultrasound  of the left axilla demonstrates normal lymph nodes.   IMPRESSION:  Indeterminate 3 mm mass in the left breast at 11 o'clock.   RECOMMENDATION:  Ultrasound-guided core needle biopsy of the left breast mass at 11  o'clock.   I have discussed the findings and recommendations with the patient  who agrees to proceed with biopsy. The patient will be contacted by  our scheduler to arrange the biopsy appointment.   BI-RADS CATEGORY 4: Suspicious.    Electronically Signed  By: Audie Pinto M.D.  On: 10/03/2020 11:17  CLINICAL DATA: 80 year old female presenting as a recall from  screening for possible left breast mass.   EXAM:  DIGITAL DIAGNOSTIC UNILATERAL LEFT MAMMOGRAM WITH TOMOSYNTHESIS AND  CAD; ULTRASOUND LEFT BREAST LIMITED   TECHNIQUE:  Left digital diagnostic mammography and breast tomosynthesis was  performed. The images were evaluated with computer-aided detection.;  Targeted ultrasound examination of the left breast was performed   COMPARISON: Previous exam(s).   ACR Breast Density Category b: There are  scattered areas of  fibroglandular density.   FINDINGS:  Mammogram:   Spot compression tomosynthesis views of the left breast were  performed. There is persistence of a possible small obscured mass  measuring 3 mm in the upper slightly inner aspect of the breast.   Ultrasound:   Targeted ultrasound performed in the left breast at 11 o'clock 6 cm  from the nipple demonstrating an irregular isoechoic mass measuring  0.3 x 0.3 x 0.3 cm. No internal vascularity. This likely corresponds  to the small mass identified mammographically.  Targeted ultrasound  of the left axilla demonstrates normal lymph nodes.   IMPRESSION:  Indeterminate 3 mm mass in the left breast at 11 o'clock.   RECOMMENDATION:  Ultrasound-guided core needle biopsy of the left breast mass at 11  o'clock.   I have discussed the findings and recommendations with the patient  who agrees to proceed with biopsy. The patient will be contacted by  our scheduler to arrange the biopsy appointment.   BI-RADS CATEGORY 4: Suspicious.    Electronically Signed  By: Audie Pinto M.D.  On: 10/03/2020 11:17   Assessment:   Malignant neoplasm of upper-inner quadrant of left female breast, unspecified estrogen receptor status (CMS-HCC) [C50.212]  Plan:     1. Malignant neoplasm of upper-inner quadrant of left female breast, unspecified estrogen receptor status (CMS-HCC) [C50.212]  Discussed the risk of surgery including recurrence, chronic pain, post-op infxn, poor/delayed wound healing, poor cosmesis, seroma, hematoma formation, and possible re-operation to address said risks. The risks of general anesthetic, if used, includes MI, CVA, sudden death or even reaction to anesthetic medications also discussed.  Typical post-op recovery time and possbility of activity restrictions were also discussed.  Alternatives include continued observation.  Benefits include possible symptom relief, pathologic evaluation, and/or curative excision.   The patient verbalized understanding and all questions were answered to the patient's satisfaction.  2. Patient has elected to proceed with surgical treatment. Procedure will be scheduled. Left lumpectomy, SLNB, RF tag placement.  Written consent was obtained.

## 2020-10-21 ENCOUNTER — Other Ambulatory Visit: Payer: Self-pay | Admitting: Surgery

## 2020-10-21 DIAGNOSIS — C50212 Malignant neoplasm of upper-inner quadrant of left female breast: Secondary | ICD-10-CM

## 2020-10-23 ENCOUNTER — Inpatient Hospital Stay: Payer: Medicare Other

## 2020-10-23 ENCOUNTER — Encounter: Payer: Self-pay | Admitting: Oncology

## 2020-10-23 ENCOUNTER — Other Ambulatory Visit: Payer: Self-pay | Admitting: Otolaryngology

## 2020-10-23 ENCOUNTER — Inpatient Hospital Stay (HOSPITAL_BASED_OUTPATIENT_CLINIC_OR_DEPARTMENT_OTHER): Payer: Medicare Other | Admitting: Oncology

## 2020-10-23 DIAGNOSIS — Z90710 Acquired absence of both cervix and uterus: Secondary | ICD-10-CM | POA: Insufficient documentation

## 2020-10-23 DIAGNOSIS — E7800 Pure hypercholesterolemia, unspecified: Secondary | ICD-10-CM | POA: Diagnosis not present

## 2020-10-23 DIAGNOSIS — E041 Nontoxic single thyroid nodule: Secondary | ICD-10-CM | POA: Diagnosis not present

## 2020-10-23 DIAGNOSIS — Z87891 Personal history of nicotine dependence: Secondary | ICD-10-CM

## 2020-10-23 DIAGNOSIS — Z8249 Family history of ischemic heart disease and other diseases of the circulatory system: Secondary | ICD-10-CM | POA: Diagnosis not present

## 2020-10-23 DIAGNOSIS — Z7982 Long term (current) use of aspirin: Secondary | ICD-10-CM

## 2020-10-23 DIAGNOSIS — I10 Essential (primary) hypertension: Secondary | ICD-10-CM | POA: Insufficient documentation

## 2020-10-23 DIAGNOSIS — K219 Gastro-esophageal reflux disease without esophagitis: Secondary | ICD-10-CM | POA: Diagnosis not present

## 2020-10-23 DIAGNOSIS — M129 Arthropathy, unspecified: Secondary | ICD-10-CM | POA: Diagnosis not present

## 2020-10-23 DIAGNOSIS — Z833 Family history of diabetes mellitus: Secondary | ICD-10-CM | POA: Insufficient documentation

## 2020-10-23 DIAGNOSIS — C50212 Malignant neoplasm of upper-inner quadrant of left female breast: Secondary | ICD-10-CM

## 2020-10-23 DIAGNOSIS — Z79899 Other long term (current) drug therapy: Secondary | ICD-10-CM

## 2020-10-23 DIAGNOSIS — Z8261 Family history of arthritis: Secondary | ICD-10-CM

## 2020-10-23 DIAGNOSIS — Z170 Estrogen receptor positive status [ER+]: Secondary | ICD-10-CM | POA: Diagnosis not present

## 2020-10-23 DIAGNOSIS — Z87442 Personal history of urinary calculi: Secondary | ICD-10-CM | POA: Diagnosis not present

## 2020-10-23 DIAGNOSIS — J439 Emphysema, unspecified: Secondary | ICD-10-CM | POA: Diagnosis not present

## 2020-10-23 HISTORY — DX: Malignant neoplasm of upper-inner quadrant of left female breast: C50.212

## 2020-10-23 LAB — SURGICAL PATHOLOGY

## 2020-10-23 NOTE — Progress Notes (Signed)
[name]  [phone] Fax:([phone]  ID: Monique Hall OB: 12-05-1940  MR#: [id number]  YKD#:[id number]  [name], [name], [name], [name]  CHIEF COMPLAINT: Invasive carcinoma of the upper inner quadrant of left breast.  INTERVAL HISTORY: Patient is a 80 year old female who was noted to have a small 3 mm abnormality on routine screening mammogram.  Subsequent biopsy revealed invasive carcinoma.  ER/PR, HER-2 are pending at time of dictation.  She currently feels well and is asymptomatic.  She has no neurologic complaints.  She denies any recent fevers or illnesses.  She has a good appetite and denies weight loss.  She has no chest pain, shortness of breath, cough, or hemoptysis.  She denies any nausea, vomiting, constipation, or diarrhea.  She has no urinary complaints.  Patient feels at her baseline offers no specific complaints today.  REVIEW OF SYSTEMS:   Review of Systems  Constitutional: Negative.  Negative for fever, malaise/fatigue and weight loss.  Respiratory: Negative.  Negative for cough, hemoptysis and shortness of breath.   Cardiovascular: Negative.  Negative for chest pain and leg swelling.  Gastrointestinal: Negative.  Negative for abdominal pain.  Genitourinary: Negative.  Negative for dysuria.  Musculoskeletal: Negative.  Negative for back pain.  Skin: Negative.  Negative for rash.  Neurological: Negative.  Negative for dizziness, focal weakness, weakness and headaches.  Psychiatric/Behavioral: Negative.  The patient is not nervous/anxious.     As per HPI. Otherwise, a complete review of systems is negative.  PAST MEDICAL HISTORY: Past Medical History:  Diagnosis Date  . Allergy   . Arthritis   . 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  ([place]) 10/23/2020  . Emphysema lung ([place])   . Esophagitis   . Gastritis   . Gastroesophageal reflux disease without esophagitis   . Hypercholesteremia   . Hypertension   . Lipoma of neck 04/27/2018  . Sciatica   . Tendonitis, [place] 2016   both   . Thyroid nodule 2019    PAST SURGICAL HISTORY: Past Surgical History:  Procedure Laterality Date  . ABDOMINAL HYSTERECTOMY  1985   total for bleeding  . APPENDECTOMY    . BIOPSY THYROID  2019  . BLADDER SUSPENSION  2004  . BREAST BIOPSY Left 10/09/2020   [place] Bx, Q-clip, path pending   . lipoma excision  03/2018   posterior neck    FAMILY HISTORY: Family History  Problem Relation Age of Onset  . CAD Mother   . Diabetes Mother   . Arthritis Mother   . Hypertension Mother   . Parkinson's disease Father   . COPD Father   . Hearing loss Father   . Breast cancer Neg Hx     ADVANCED DIRECTIVES (Y/N):  N  HEALTH MAINTENANCE: Social History   Tobacco Use  . Smoking status: Former Smoker    Packs/day: 1.00    Years: 35.00    Pack years: 35.00    Types: Cigarettes    Quit date: 09/01/2002    Years since quitting: 18.1  . Smokeless tobacco: Never Used  . Tobacco comment: smoking cessation materials not required  Vaping Use  . Vaping Use: Never used  Substance Use Topics  . Alcohol use: No    Alcohol/week: 0.0 standard drinks  . Drug use: No     Colonoscopy:  PAP:  Bone density:  Lipid panel:  Allergies  Allergen Reactions  . Baclofen     Other reaction(s): Unknown  . Etodolac Nausea Only    Current Outpatient Medications  Medication Sig Dispense Refill  . acetaminophen (TYLENOL) 500 MG tablet Take 1,000 mg by mouth every 8 (eight) hours as needed (pain). For shoulder pain    . aspirin EC 81 MG tablet Take 81 mg by mouth daily.    . Black Cohosh 200 MG CAPS Take 200 mg by mouth daily. Reported on 08/20/2015    . Black Elderberry (SAMBUCUS ELDERBERRY PO) Take 1 each by mouth daily. gummy    . ezetimibe (ZETIA)  10 MG tablet TAKE 1 TABLET EVERY DAY (Patient taking differently: Take 10 mg by mouth daily.) 90 tablet 0  . ibuprofen (ADVIL) 200 MG tablet Take 400 mg by mouth every 8 (eight) hours as needed for moderate pain.    Marland Kitchen loratadine (CLARITIN) 10 MG tablet Take 10 mg by mouth daily.    Marland Kitchen losartan-hydrochlorothiazide (HYZAAR) 100-12.5 MG tablet TAKE 1 TABLET EVERY DAY (Patient taking differently: Take 1 tablet by mouth daily.) 90 tablet 0  . triamcinolone (NASACORT) 55 MCG/ACT AERO nasal inhaler Place 1 spray into the nose at bedtime as needed (allergies).     No current facility-administered medications for this visit.    OBJECTIVE: Vitals:   10/23/20 1327  BP: (!) 162/79  Pulse: 98  Resp: 18  Temp: 98.8 F (37.1 C)  SpO2: 98%     Body mass index is 28.54 kg/m.    ECOG FS:0 - Asymptomatic  General: Well-developed, well-nourished, no acute distress. Eyes: Pink conjunctiva, anicteric sclera. HEENT: Normocephalic, moist mucous membranes. Breast: Exam deferred today. Lungs: No audible wheezing or coughing. Heart: Regular rate and rhythm. Abdomen: Soft, nontender, no obvious distention. Musculoskeletal: No edema, cyanosis, or clubbing. Neuro: Alert, answering all questions appropriately. Cranial nerves grossly intact. Skin: No rashes or petechiae noted. Psych: Normal affect. Lymphatics: No cervical, calvicular, axillary or inguinal LAD.   LAB RESULTS:  Lab Results  Component Value Date   NA 139 09/25/2020   K 4.3 09/25/2020   CL 101 09/25/2020   CO2 21 09/25/2020   GLUCOSE 106 (H) 09/25/2020   BUN 19 09/25/2020   CREATININE 1.12 (H) 09/25/2020   CALCIUM 10.1 09/25/2020   PROT 7.1 09/25/2020   ALBUMIN 4.4 09/25/2020   AST 18 09/25/2020   ALT 18 09/25/2020   ALKPHOS 48 09/25/2020   BILITOT 0.9 09/25/2020   GFRNONAA 55 (L) 09/20/2019   GFRAA 63 09/20/2019    Lab Results  Component Value Date   WBC 7.8 09/25/2020   NEUTROABS 3.9 09/25/2020   HGB 12.4 09/25/2020   HCT  37.1 09/25/2020   MCV 89 09/25/2020   PLT 414 09/25/2020     STUDIES: US BREAST LTD UNI LEFT INC AXILLA  Result Date: 10/03/2020 CLINICAL DATA:  80 year old female presenting as a recall from screening for possible left breast mass. EXAM: DIGITAL DIAGNOSTIC UNILATERAL LEFT MAMMOGRAM WITH TOMOSYNTHESIS AND CAD; ULTRASOUND LEFT BREAST LIMITED TECHNIQUE: Left digital diagnostic mammography and breast tomosynthesis was performed. The images were evaluated with computer-aided detection.; Targeted ultrasound examination of the left breast was performed COMPARISON:  Previous exam(s). ACR Breast Density Category b: There are scattered areas of fibroglandular density. FINDINGS: Mammogram: Spot compression tomosynthesis views of the left breast were performed. There is persistence of a possible small obscured mass measuring 3 mm in the upper slightly inner aspect of the breast. Ultrasound: Targeted  ultrasound performed in the left breast at 11 o'clock 6 cm from the nipple demonstrating an irregular isoechoic mass measuring 0.3 x 0.3 x 0.3 cm. No internal vascularity. This likely corresponds to the small mass identified mammographically. Targeted ultrasound of the left axilla demonstrates normal lymph nodes. IMPRESSION: Indeterminate 3 mm mass in the left breast at 11 o'clock. RECOMMENDATION: Ultrasound-guided core needle biopsy of the left breast mass at 11 o'clock. I have discussed the findings and recommendations with the patient who agrees to proceed with biopsy. The patient will be contacted by our scheduler to arrange the biopsy appointment. BI-RADS CATEGORY  4: Suspicious. Electronically Signed   By: Audie Pinto M.D.   On: 10/03/2020 11:17   MM DIAG BREAST TOMO UNI LEFT  Result Date: 10/03/2020 CLINICAL DATA:  80 year old female presenting as a recall from screening for possible left breast mass. EXAM: DIGITAL DIAGNOSTIC UNILATERAL LEFT MAMMOGRAM WITH TOMOSYNTHESIS AND CAD; ULTRASOUND LEFT BREAST  LIMITED TECHNIQUE: Left digital diagnostic mammography and breast tomosynthesis was performed. The images were evaluated with computer-aided detection.; Targeted ultrasound examination of the left breast was performed COMPARISON:  Previous exam(s). ACR Breast Density Category b: There are scattered areas of fibroglandular density. FINDINGS: Mammogram: Spot compression tomosynthesis views of the left breast were performed. There is persistence of a possible small obscured mass measuring 3 mm in the upper slightly inner aspect of the breast. Ultrasound: Targeted ultrasound performed in the left breast at 11 o'clock 6 cm from the nipple demonstrating an irregular isoechoic mass measuring 0.3 x 0.3 x 0.3 cm. No internal vascularity. This likely corresponds to the small mass identified mammographically. Targeted ultrasound of the left axilla demonstrates normal lymph nodes. IMPRESSION: Indeterminate 3 mm mass in the left breast at 11 o'clock. RECOMMENDATION: Ultrasound-guided core needle biopsy of the left breast mass at 11 o'clock. I have discussed the findings and recommendations with the patient who agrees to proceed with biopsy. The patient will be contacted by our scheduler to arrange the biopsy appointment. BI-RADS CATEGORY  4: Suspicious. Electronically Signed   By: Audie Pinto M.D.   On: 10/03/2020 11:17   MM CLIP PLACEMENT LEFT  Result Date: 10/14/2020 CLINICAL DATA:  Post procedure mammogram for clip placement. EXAM: DIAGNOSTIC LEFT MAMMOGRAM POST ULTRASOUND BIOPSY COMPARISON:  Previous exam(s). FINDINGS: Mammographic images were obtained following ultrasound guided biopsy of a left breast mass at 11 o'clock. The biopsy marking clip is in expected position at the site of biopsy. IMPRESSION: Appropriate positioning of the Q shaped biopsy marking clip at the site of biopsy in the left breast at 11 o'clock. Final Assessment: Post Procedure Mammograms for Marker Placement Electronically Signed   By: Audie Pinto M.D.   On: 10/14/2020 13:56   [place] LT BREAST BX W LOC DEV 1ST LESION IMG BX SPEC US GUIDE  Addendum Date: 10/16/2020   ADDENDUM REPORT: 10/16/2020 14:28 ADDENDUM: PATHOLOGY revealed: A. BREAST, LEFT 11:00 6 CM FN; ULTRASOUND-GUIDED BIOPSY: - INVASIVE MAMMARY CARCINOMA, NO SPECIAL TYPE. Size of invasive carcinoma: 5 mm in this sample. Grade 1. Ductal carcinoma in situ: Not identified. Lymphovascular invasion: Not identified. Pathology results are CONCORDANT with imaging findings, per Dr. Audie Pinto. Pathology results and recommendations below were discussed with patient by telephone on 10/16/2020. Patient reported biopsy site within normal limits with slight tenderness at the site, and no significant bruising. Post biopsy care instructions were reviewed, questions were answered and my direct phone number was provided to patient. Patient was instructed to call [facility]  if any concerns or questions arise related to the biopsy. Recommend surgical consultation: Request for surgical consultation relayed to Al Pimple RN and Tanya Nones RN at [facility] by Electa Sniff RN on 10/16/2020. Pathology results reported by Electa Sniff RN on 10/16/2020. Electronically Signed   By: Audie Pinto M.D.   On: 10/16/2020 14:28   Result Date: 10/16/2020 CLINICAL DATA:  80 year old female presenting for biopsy of a left breast mass. EXAM: ULTRASOUND GUIDED LEFT BREAST CORE NEEDLE BIOPSY COMPARISON:  Previous exam(s). PROCEDURE: I met with the patient and we discussed the procedure of ultrasound-guided biopsy, including benefits and alternatives. We discussed the high likelihood of a successful procedure. We discussed the risks of the procedure, including infection, bleeding, tissue injury, clip migration, and inadequate sampling. Informed written consent was given. The usual time-out protocol was performed immediately prior to the procedure. Lesion quadrant: Upper inner quadrant  Using sterile technique and 1% Lidocaine as local anesthetic, under direct ultrasound visualization, a 14 gauge spring-loaded device was used to perform biopsy of a left breast mass at 11 o'clock using a medial approach. At the conclusion of the procedure a Q tissue marker clip was deployed into the biopsy cavity. Follow up 2 view mammogram was performed and dictated separately. IMPRESSION: Ultrasound guided biopsy of a left breast mass at 11 o'clock. No apparent complications. Electronically Signed: By: Audie Pinto M.D. 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 Invasive carcinoma of the upper inner quadrant of left breast: Given the size of patient's tumor on ultrasound of 3 mm, it is unlikely she will require adjuvant chemotherapy even if ER/PR and HER-2 returned triple negative.  Patient has a lumpectomy scheduled on November 06, 2020.  She most likely will require adjuvant XRT plus or minus an aromatase inhibitor pending the ER/PR status of her malignancy.  No further intervention is needed this time.  Return to clinic 1 week after her surgery to discuss her final pathology results, consultation with radiation oncology, and treatment planning if necessary.  I spent a total of 60 minutes reviewing chart data, face-to-face evaluation with the patient, counseling and coordination of care as detailed above.  Patient expressed understanding and was in agreement with this plan. She also understands that She can call clinic at any time with any questions, concerns, or complaints.   Cancer Staging No matching staging information was found for the patient.  Lloyd Huger, MD   10/23/2020 2:34 PM

## 2020-10-30 ENCOUNTER — Other Ambulatory Visit: Admission: RE | Admit: 2020-10-30 | Payer: Medicare Other | Source: Ambulatory Visit

## 2020-10-31 ENCOUNTER — Other Ambulatory Visit: Payer: Self-pay

## 2020-10-31 ENCOUNTER — Encounter
Admission: RE | Admit: 2020-10-31 | Discharge: 2020-10-31 | Disposition: A | Discharge status: Home / Self Care | Payer: Medicare Other | Source: Ambulatory Visit | Attending: Surgery | Admitting: Surgery

## 2020-10-31 HISTORY — DX: Personal history of urinary calculi: Z87.442

## 2020-10-31 NOTE — Patient Instructions (Signed)
Your procedure is scheduled on: November 06, 2020 Thursday REPORT AT 7:30 AM TO MAMMOGRAPHY OR NUCLEAR MED AS INSTRUCTED.   REMEMBER: Instructions that are not followed completely may result in serious medical risk, up to and including death; or upon the discretion of your surgeon and anesthesiologist your surgery may need to be rescheduled.  Do not eat food after midnight the night before surgery.  No gum chewing, lozengers or hard candies.  You may however, drink CLEAR liquids up to 2 hours before you are scheduled to arrive for your surgery. Do not drink anything within 2 hours of your scheduled arrival time.  Clear liquids include: - water  - apple juice without pulp - gatorade (not RED, PURPLE, OR BLUE) - black coffee or tea (Do NOT add milk or creamers to the coffee or tea) Do NOT drink anything that is not on this list.  Type 1 and Type 2 diabetics should only drink water.   TAKE THESE MEDICATIONS THE MORNING OF SURGERY WITH A SIP OF WATER: ZETIA LORATADINE  Use inhalers on the day of surgery   NO ASPIRIN AFTER 10/31/2020  One week prior to surgery: Stop Anti-inflammatories (NSAIDS) such as Advil, Aleve, Ibuprofen, Motrin, Naproxen, Naprosyn and Aspirin based products such as Excedrin, Goodys Powder, BC Powder.  USE TYLENOL IF NEEDED FOR SURGERY Stop ANY OVER THE COUNTER supplements until after surgery.  No Alcohol for 24 hours before or after surgery.  No Smoking including e-cigarettes for 24 hours prior to surgery.  No chewable tobacco products for at least 6 hours prior to surgery.  No nicotine patches on the day of surgery.  Do not use any "recreational" drugs for at least a week prior to your surgery.  Please be advised that the combination of cocaine and anesthesia may have negative outcomes, up to and including death. If you test positive for cocaine, your surgery will be cancelled.  On the morning of surgery brush your teeth with toothpaste and water, you may  rinse your mouth with mouthwash if you wish. Do not swallow any toothpaste or mouthwash.  Do not wear jewelry, make-up, hairpins, clips or nail polish.  Do not wear lotions, powders, or perfumes OR DEODORANT   Do not shave body from the neck down 48 hours prior to surgery just in case you cut yourself which could leave a site for infection.  Also, freshly shaved skin may become irritated if using the CHG soap.  Contact lenses, hearing aids and dentures may not be worn into surgery.  Do not bring valuables to the hospital. [facility] is not responsible for any missing/lost belongings or valuables.   Use CHG Soap as directed on instruction sheet.  Notify your doctor if there is any change in your medical condition (cold, fever, infection).  Wear comfortable clothing (specific to your surgery type) to the hospital.  Plan for stool softeners for home use; pain medications have a tendency to cause constipation. You can also help prevent constipation by eating foods high in fiber such as fruits and vegetables and drinking plenty of fluids as your diet allows.  After surgery, you can help prevent lung complications by doing breathing exercises.  Take deep breaths and cough every 1-2 hours. Your doctor may order a device called an Incentive Spirometer to help you take deep breaths. When coughing or sneezing, hold a pillow firmly against your incision with both hands. This is called "splinting." Doing this helps protect your incision. It also decreases belly discomfort.  If you are being discharged the day of surgery, you will not be allowed to drive home. You will need a responsible adult (18 years or older) to drive you home and stay with you that night.   If you are taking public transportation, you will need to have a responsible adult (18 years or older) with you. Please confirm with your physician that it is acceptable to use public transportation.   Please call the [place] [facility] at [phone] if you have any questions about these instructions.  Surgery Visitation Policy:  Patients undergoing a surgery or procedure may have one family member or support person with them as long as that person is not COVID-19 positive or experiencing its symptoms.  That person may remain in the waiting area during the procedure.  Inpatient Visitation:    Visiting hours are 7 a.m. to 8 p.m. Inpatients will be allowed two visitors daily. The visitors may change each day during the patient's stay. No visitors under the age of 63. Any visitor under the age of 94 must be accompanied by an adult. The visitor must pass COVID-19 screenings, use hand sanitizer when entering and exiting the patient's room and wear a mask at all times, including in the patient's room. Patients must also wear a mask when staff or their visitor are in the room. Masking is required regardless of vaccination status.

## 2020-11-03 ENCOUNTER — Ambulatory Visit
Admission: RE | Admit: 2020-11-03 | Discharge: 2020-11-03 | Disposition: A | Discharge status: Home / Self Care | Payer: Medicare Other | Source: Ambulatory Visit | Attending: Surgery | Admitting: Surgery

## 2020-11-03 ENCOUNTER — Other Ambulatory Visit: Payer: Self-pay

## 2020-11-03 DIAGNOSIS — C50212 Malignant neoplasm of upper-inner quadrant of left female breast: Secondary | ICD-10-CM | POA: Diagnosis not present

## 2020-11-04 ENCOUNTER — Other Ambulatory Visit: Payer: Medicare Other

## 2020-11-06 ENCOUNTER — Encounter
Admission: RE | Disposition: A | Discharge status: Home / Self Care | Payer: Self-pay | Source: Home / Self Care | Attending: Surgery

## 2020-11-06 ENCOUNTER — Ambulatory Visit
Admission: RE | Admit: 2020-11-06 | Discharge: 2020-11-06 | Disposition: A | Discharge status: Home / Self Care | Payer: Medicare Other | Source: Ambulatory Visit | Attending: Surgery | Admitting: Surgery

## 2020-11-06 ENCOUNTER — Encounter: Payer: Self-pay | Admitting: Surgery

## 2020-11-06 ENCOUNTER — Ambulatory Visit: Payer: Medicare Other | Admitting: Anesthesiology

## 2020-11-06 ENCOUNTER — Ambulatory Visit
Admission: RE | Admit: 2020-11-06 | Discharge: 2020-11-06 | Disposition: A | Discharge status: Home / Self Care | Payer: Medicare Other | Attending: Surgery | Admitting: Surgery

## 2020-11-06 ENCOUNTER — Other Ambulatory Visit: Payer: Self-pay

## 2020-11-06 DIAGNOSIS — I10 Essential (primary) hypertension: Secondary | ICD-10-CM | POA: Diagnosis not present

## 2020-11-06 DIAGNOSIS — K219 Gastro-esophageal reflux disease without esophagitis: Secondary | ICD-10-CM | POA: Diagnosis not present

## 2020-11-06 DIAGNOSIS — E782 Mixed hyperlipidemia: Secondary | ICD-10-CM | POA: Diagnosis not present

## 2020-11-06 DIAGNOSIS — C50212 Malignant neoplasm of upper-inner quadrant of left female breast: Secondary | ICD-10-CM | POA: Diagnosis not present

## 2020-11-06 DIAGNOSIS — Z87891 Personal history of nicotine dependence: Secondary | ICD-10-CM | POA: Diagnosis not present

## 2020-11-06 DIAGNOSIS — Z79899 Other long term (current) drug therapy: Secondary | ICD-10-CM | POA: Insufficient documentation

## 2020-11-06 DIAGNOSIS — Z888 Allergy status to other drugs, medicaments and biological substances status: Secondary | ICD-10-CM | POA: Insufficient documentation

## 2020-11-06 DIAGNOSIS — Z7982 Long term (current) use of aspirin: Secondary | ICD-10-CM | POA: Insufficient documentation

## 2020-11-06 DIAGNOSIS — Z886 Allergy status to analgesic agent status: Secondary | ICD-10-CM | POA: Diagnosis not present

## 2020-11-06 DIAGNOSIS — Z170 Estrogen receptor positive status [ER+]: Secondary | ICD-10-CM | POA: Diagnosis not present

## 2020-11-06 DIAGNOSIS — C50412 Malignant neoplasm of upper-outer quadrant of left female breast: Secondary | ICD-10-CM | POA: Diagnosis not present

## 2020-11-06 HISTORY — PX: BREAST LUMPECTOMY: SHX2

## 2020-11-06 HISTORY — PX: BREAST LUMPECTOMY,RADIO FREQ LOCALIZER,AXILLARY SENTINEL LYMPH NODE BIOPSY: SHX6900

## 2020-11-06 SURGERY — BREAST LUMPECTOMY,RADIO FREQ LOCALIZER,AXILLARY SENTINEL LYMPH NODE BIOPSY
Anesthesia: General | Site: Breast | Laterality: Left

## 2020-11-06 MED ORDER — TECHNETIUM TC 99M TILMANOCEPT KIT
1.0300 | PACK | Freq: Once | INTRAVENOUS | Status: AC | PRN
  Administered 2020-11-06: 1.03 via INTRADERMAL

## 2020-11-06 MED ORDER — ACETAMINOPHEN 10 MG/ML IV SOLN
INTRAVENOUS | Status: DC | PRN
  Administered 2020-11-06: 1000 mg via INTRAVENOUS

## 2020-11-06 MED ORDER — FAMOTIDINE 20 MG PO TABS: 20.0000 mg | ORAL_TABLET | Freq: Once | ORAL | Status: DC

## 2020-11-06 MED ORDER — ONDANSETRON HCL 4 MG/2ML IJ SOLN: 4.0000 mg | Freq: Once | INTRAMUSCULAR | Status: DC | PRN

## 2020-11-06 MED ORDER — CEFAZOLIN SODIUM-DEXTROSE 2-4 GM/100ML-% IV SOLN
2.0000 g | INTRAVENOUS | Status: AC
  Administered 2020-11-06: 2 g via INTRAVENOUS

## 2020-11-06 MED ORDER — FENTANYL CITRATE (PF) 100 MCG/2ML IJ SOLN
INTRAMUSCULAR | Status: AC
  Filled 2020-11-06: qty 2

## 2020-11-06 MED ORDER — PROPOFOL 10 MG/ML IV BOLUS
INTRAVENOUS | Status: DC | PRN
  Administered 2020-11-06: 50 mg via INTRAVENOUS
  Administered 2020-11-06: 100 mg via INTRAVENOUS

## 2020-11-06 MED ORDER — LIDOCAINE HCL (CARDIAC) PF 100 MG/5ML IV SOSY
PREFILLED_SYRINGE | INTRAVENOUS | Status: DC | PRN
  Administered 2020-11-06: 60 mg via INTRAVENOUS

## 2020-11-06 MED ORDER — CHLORHEXIDINE GLUCONATE CLOTH 2 % EX PADS: 6.0000 | MEDICATED_PAD | Freq: Once | CUTANEOUS | Status: DC

## 2020-11-06 MED ORDER — DEXAMETHASONE SODIUM PHOSPHATE 10 MG/ML IJ SOLN
INTRAMUSCULAR | Status: DC | PRN
  Administered 2020-11-06: 10 mg via INTRAVENOUS

## 2020-11-06 MED ORDER — HYDROCODONE-ACETAMINOPHEN 7.5-325 MG PO TABS
1.0000 | ORAL_TABLET | Freq: Once | ORAL | Status: AC | PRN
  Administered 2020-11-06: 1 via ORAL

## 2020-11-06 MED ORDER — ACETAMINOPHEN 10 MG/ML IV SOLN
INTRAVENOUS | Status: AC
  Filled 2020-11-06: qty 100

## 2020-11-06 MED ORDER — BUPIVACAINE-EPINEPHRINE 0.5% -1:200000 IJ SOLN
INTRAMUSCULAR | Status: DC | PRN
  Administered 2020-11-06: 6.5 mL

## 2020-11-06 MED ORDER — MIDAZOLAM HCL 2 MG/2ML IJ SOLN
INTRAMUSCULAR | Status: DC | PRN
  Administered 2020-11-06: 2 mg via INTRAVENOUS

## 2020-11-06 MED ORDER — MIDAZOLAM HCL 2 MG/2ML IJ SOLN
INTRAMUSCULAR | Status: AC
  Filled 2020-11-06: qty 2

## 2020-11-06 MED ORDER — CHLORHEXIDINE GLUCONATE 0.12 % MT SOLN
OROMUCOSAL | Status: AC
  Filled 2020-11-06: qty 15

## 2020-11-06 MED ORDER — FENTANYL CITRATE (PF) 100 MCG/2ML IJ SOLN
INTRAMUSCULAR | Status: DC | PRN
  Administered 2020-11-06 (×4): 25 ug via INTRAVENOUS

## 2020-11-06 MED ORDER — FAMOTIDINE 20 MG PO TABS
ORAL_TABLET | ORAL | Status: AC
  Filled 2020-11-06: qty 1

## 2020-11-06 MED ORDER — CEFAZOLIN SODIUM-DEXTROSE 2-4 GM/100ML-% IV SOLN
INTRAVENOUS | Status: AC
  Filled 2020-11-06: qty 100

## 2020-11-06 MED ORDER — ACETAMINOPHEN 160 MG/5ML PO SOLN
325.0000 mg | ORAL | Status: DC | PRN
Start: 2020-11-06 — End: 2020-11-06
  Filled 2020-11-06: qty 20.3

## 2020-11-06 MED ORDER — DOCUSATE SODIUM 100 MG PO CAPS: 100.0000 mg | ORAL_CAPSULE | Freq: Two times a day (BID) | ORAL | 0 refills | Status: AC | PRN

## 2020-11-06 MED ORDER — CHLORHEXIDINE GLUCONATE 0.12 % MT SOLN
15.0000 mL | Freq: Once | OROMUCOSAL | Status: AC
  Administered 2020-11-06: 15 mL via OROMUCOSAL

## 2020-11-06 MED ORDER — FENTANYL CITRATE (PF) 100 MCG/2ML IJ SOLN: 25.0000 ug | INTRAMUSCULAR | Status: DC | PRN

## 2020-11-06 MED ORDER — LACTATED RINGERS IV SOLN: INTRAVENOUS | Status: DC

## 2020-11-06 MED ORDER — SEVOFLURANE IN SOLN
RESPIRATORY_TRACT | Status: AC
  Filled 2020-11-06: qty 250

## 2020-11-06 MED ORDER — ACETAMINOPHEN 325 MG PO TABS: 325.0000 mg | ORAL_TABLET | ORAL | Status: DC | PRN

## 2020-11-06 MED ORDER — ONDANSETRON HCL 4 MG/2ML IJ SOLN
INTRAMUSCULAR | Status: DC | PRN
  Administered 2020-11-06 (×2): 4 mg via INTRAVENOUS

## 2020-11-06 MED ORDER — HYDROCODONE-ACETAMINOPHEN 5-325 MG PO TABS: 1.0000 | ORAL_TABLET | Freq: Four times a day (QID) | ORAL | 0 refills | Status: DC | PRN

## 2020-11-06 MED ORDER — ACETAMINOPHEN 500 MG PO TABS: 500.0000 mg | ORAL_TABLET | Freq: Four times a day (QID) | ORAL | Status: AC | PRN

## 2020-11-06 MED ORDER — ORAL CARE MOUTH RINSE: 15.0000 mL | Freq: Once | OROMUCOSAL | Status: AC

## 2020-11-06 MED ORDER — DROPERIDOL 2.5 MG/ML IJ SOLN
0.6250 mg | Freq: Once | INTRAMUSCULAR | Status: DC | PRN
  Filled 2020-11-06: qty 2

## 2020-11-06 MED ORDER — HYDROCODONE-ACETAMINOPHEN 7.5-325 MG PO TABS
ORAL_TABLET | ORAL | Status: AC
  Filled 2020-11-06: qty 1

## 2020-11-06 MED ORDER — PHENYLEPHRINE HCL (PRESSORS) 10 MG/ML IV SOLN
INTRAVENOUS | Status: DC | PRN
  Administered 2020-11-06: 50 ug via INTRAVENOUS
  Administered 2020-11-06 (×3): 100 ug via INTRAVENOUS
  Administered 2020-11-06 (×2): 50 ug via INTRAVENOUS

## 2020-11-06 MED ORDER — LIDOCAINE HCL 1 % IJ SOLN
INTRAMUSCULAR | Status: DC | PRN
  Administered 2020-11-06: 6.5 mL

## 2020-11-06 SURGICAL SUPPLY — 47 items
ADH SKN CLS APL DERMABOND .7 (GAUZE/BANDAGES/DRESSINGS) ×2
APL PRP STRL LF DISP 70% ISPRP (MISCELLANEOUS) ×1
APPLIER CLIP 11 MED OPEN (CLIP)
APR CLP MED 11 20 MLT OPN (CLIP)
BLADE SURG 15 STRL LF DISP TIS (BLADE) ×1 IMPLANT
BLADE SURG 15 STRL SS (BLADE) ×2
CANISTER SUCT 1200ML W/VALVE (MISCELLANEOUS) ×2 IMPLANT
CHLORAPREP W/TINT 26 (MISCELLANEOUS) ×2 IMPLANT
CLIP APPLIE 11 MED OPEN (CLIP) IMPLANT
CNTNR SPEC 2.5X3XGRAD LEK (MISCELLANEOUS)
CONT SPEC 4OZ STER OR WHT (MISCELLANEOUS)
CONT SPEC 4OZ STRL OR WHT (MISCELLANEOUS)
CONTAINER SPEC 2.5X3XGRAD LEK (MISCELLANEOUS) IMPLANT
COVER WAND RF STERILE (DRAPES) ×2 IMPLANT
DERMABOND ADVANCED (GAUZE/BANDAGES/DRESSINGS) ×2
DERMABOND ADVANCED .7 DNX12 (GAUZE/BANDAGES/DRESSINGS) ×2 IMPLANT
DEVICE DSSCT PLSMBLD 3.0S LGHT (MISCELLANEOUS) ×1 IMPLANT
DEVICE DUBIN SPECIMEN MAMMOGRA (MISCELLANEOUS) ×2 IMPLANT
DRAPE LAPAROTOMY 77X122 PED (DRAPES) ×2 IMPLANT
ELECT CAUTERY BLADE TIP 2.5 (TIP) ×2
ELECT REM PT RETURN 9FT ADLT (ELECTROSURGICAL) ×2
ELECTRODE CAUTERY BLDE TIP 2.5 (TIP) ×1 IMPLANT
ELECTRODE REM PT RTRN 9FT ADLT (ELECTROSURGICAL) ×1 IMPLANT
GLOVE SURG SYN 6.5 ES PF (GLOVE) ×4 IMPLANT
GLOVE SURG UNDER POLY LF SZ7 (GLOVE) ×2 IMPLANT
GOWN STRL REUS W/ TWL LRG LVL3 (GOWN DISPOSABLE) ×3 IMPLANT
GOWN STRL REUS W/TWL LRG LVL3 (GOWN DISPOSABLE) ×6
KIT MARKER MARGIN INK (KITS) IMPLANT
KIT TURNOVER KIT A (KITS) ×2 IMPLANT
LABEL OR SOLS (LABEL) ×2 IMPLANT
LIGHT WAVEGUIDE WIDE FLAT (MISCELLANEOUS) IMPLANT
MANIFOLD NEPTUNE II (INSTRUMENTS) ×2 IMPLANT
MARKER MARGIN CORRECT CLIP (MARKER) IMPLANT
NEEDLE HYPO 22GX1.5 SAFETY (NEEDLE) ×4 IMPLANT
PACK BASIN MINOR ARMC (MISCELLANEOUS) ×2 IMPLANT
PLASMABLADE 3.0S W/LIGHT (MISCELLANEOUS) ×2
SET LOCALIZER 20 PROBE US (MISCELLANEOUS) ×2 IMPLANT
SUT MNCRL 4-0 (SUTURE) ×4
SUT MNCRL 4-0 27XMFL (SUTURE) ×2
SUT SILK 3 0 12 30 (SUTURE) IMPLANT
SUT VIC AB 3-0 SH 27 (SUTURE) ×6
SUT VIC AB 3-0 SH 27X BRD (SUTURE) ×3 IMPLANT
SUTURE MNCRL 4-0 27XMF (SUTURE) ×2 IMPLANT
SYR 20ML LL LF (SYRINGE) ×2 IMPLANT
SYR BULB IRRIG 60ML STRL (SYRINGE) ×2 IMPLANT
TUBING CONNECTING 10 (TUBING) ×2 IMPLANT
WATER STERILE IRR 1000ML POUR (IV SOLUTION) ×2 IMPLANT

## 2020-11-06 NOTE — OR Nursing (Signed)
Incision under left arm intact with skin glue.  No drainage

## 2020-11-06 NOTE — Transfer of Care (Signed)
Immediate Anesthesia Transfer of Care Note  Patient: Monique Hall  Procedure(s) Performed: BREAST LUMPECTOMY,RADIO FREQ LOCALIZER,AXILLARY SENTINEL LYMPH NODE BIOPSY (Left Breast)  Patient Location: PACU  Anesthesia Type:General  Level of Consciousness: awake, drowsy and patient cooperative  Airway & Oxygen Therapy: Patient Spontanous Breathing and Patient connected to face mask oxygen  Post-op Assessment: Report given to RN, Post -op Vital signs reviewed and stable and Patient moving all extremities  Post vital signs: Reviewed and stable  Last Vitals:  Vitals Value Taken Time  BP 139/74 11/06/20 1037  Temp 36.3 C 11/06/20 1037  Pulse 64 11/06/20 1042  Resp 12 11/06/20 1042  SpO2 99 % 11/06/20 1042  Vitals shown include unvalidated device data.  Last Pain:  Vitals:   11/06/20 1037  TempSrc:   PainSc: 0-No pain         Complications: No complications documented.

## 2020-11-06 NOTE — Op Note (Signed)
Preoperative diagnosis:  Left breast carcinoma.  Postoperative diagnosis: same.   Procedure: RF tag-localized Left  breast partial mastectomy.                      Left  Axillary Sentinel Lymph node biopsy  Anesthesia: GETA  Surgeon: Dr. Benjamine Sprague  Wound Classification: Clean  Indications: Patient is a 80 y.o. female with a nonpalpable Left  breast mass noted on mammography with core biopsy demonstrating carcinoma.  requires RF localizer placement, partial mastectomy for treatment with sentinel lymph node biopsy.   Specimen: Left  Breast mass, Sentinel Lymph nodes x 1, anterior lateral margin  Complications: None  Estimated Blood Loss: 15mL  Findings: 1. Specimen mammography shows marker and RF localizer on specimen 2. Pathology call refers gross examination of margins was positive on anterior lateral aspect, additional margins resected 3. No other palpable mass or lymph node identified.   Operation performed with curative intent:Yes  Tracer(s) used to identify sentinel nodes in the upfront surgery (non-neoadjuvant) setting (select all that apply):Radioactive Tracer  Tracer(s) used to identify sentinel nodes in the neoadjuvant setting (select all that apply):N/A  All nodes (colored or non-colored) present at the end of a dye-filled lymphatic channel were removed:N/A  All significantly radioactive nodes were removed:Yes  All palpable suspicious nodes were removed:Yes  Biopsy-proven positive nodes marked with clips prior to chemotherapy were identified and removed:Yes    Description of procedure: RF localization was performed by radiology prior to procedure. In the nuclear medicine suite, the subareolar region was injected with Tc-99 sulfur colloid the morning of procedure. Localization studies were reviewed. The patient was taken to the operating room and placed supine on the operating table, and after general anesthesia the Left  breast and axilla were prepped and draped  in the usual sterile fashion. A time-out was completed verifying correct patient, procedure, site, positioning, and implant(s) and/or special equipment prior to beginning this procedure.  By identifying the RF localizer, the probable trajectory and location of the mass was visualized. A skin incision was planned in such a way as to minimize the amount of dissection to reach the mass.  The skin incision was made after infusion of local. Flaps were raised and  Sharp and blunt dissection was then taken down to the mass, taking care to include the entire RF localizer and a margin of grossly normal tissue. The specimen was removed. The specimen was oriented with paint and sent to radiology with the localization studies. Call back noted positive anterior-lateral margins, so additional tissue was removed from this area, oriented with paint, and sent pending pathology.  A hand-held gamma probe was used to identify the location of the hottest spot in the axilla. An incision was made around the caudal axillary hairline. Sharp and blunt Dissection was carried down to subdermal facias. The probe was placed within wound and again, the point of maximal count was found. Dissection continue until nodule was identified. The probe was placed in contact with the node and 3850 counts were recorded. The node was excised in its entirety. Ex vivo, the node measured 3875 counts when placed on the probe. The bed of the node measured 150 counts. No additional hot spots were identified. No clinically abnormal nodes were palpated. Both wounds irrigated, hemostasis was achieved and the wound closed in layers with  interrupted sutures of 3-0 Vicryl in deep dermal layer and a running subcuticular suture of Monocryl 4-0, then dressed with dermabond. The patient tolerated  the procedure well and was taken to the postanesthesia care unit in stable condition. Sponge and instrument count correct at end of procedure.

## 2020-11-06 NOTE — Discharge Instructions (Signed)
Removal, Care After This sheet gives you information about how to care for yourself after your procedure. Your health care provider may also give you more specific instructions. If you have problems or questions, contact your health care provider. What can I expect after the procedure? After the procedure, it is common to have:  Soreness.  Bruising.  Itching. Follow these instructions at home: site care Follow instructions from your health care provider about how to take care of your site. Make sure you:  Wash your hands with soap and water before and after you change your bandage (dressing). If soap and water are not available, use hand sanitizer.  Leave stitches (sutures), skin glue, or adhesive strips in place. These skin closures may need to stay in place for 2 weeks or longer. If adhesive strip edges start to loosen and curl up, you may trim the loose edges. Do not remove adhesive strips completely unless your health care provider tells you to do that.  If the area bleeds or bruises, apply gentle pressure for 10 minutes.  OK TO SHOWER IN 24HRS  Check your site every day for signs of infection. Check for:  Redness, swelling, or pain.  Fluid or blood.  Warmth.  Pus or a bad smell.  General instructions  Rest and then return to your normal activities as told by your health care provider.  RESUME ASPIRIN IN 48HRS .  tylenol and advil as needed for discomfort.  Please alternate between the two every four hours as needed for pain.   .  Use narcotics, if prescribed, only when tylenol and motrin is not enough to control pain. .  325-650mg  every 8hrs to max of 3000mg /24hrs (including the 325mg  in every norco dose) for the tylenol.   .  Advil up to 800mg  per dose every 8hrs as needed for pain.    Keep all follow-up visits as told by your health care provider. This is important. Contact a health care provider if:  You have redness, swelling, or pain around your site.  You  have fluid or blood coming from your site.  Your site feels warm to the touch.  You have pus or a bad smell coming from your site.  You have a fever.  Your sutures, skin glue, or adhesive strips loosen or come off sooner than expected. Get help right away if:  You have bleeding that does not stop with pressure or a dressing. Summary  After the procedure, it is common to have some soreness, bruising, and itching at the site.  Follow instructions from your health care provider about how to take care of your site.  Check your site every day for signs of infection.  Contact a health care provider if you have redness, swelling, or pain around your site, or your site feels warm to the touch.  Keep all follow-up visits as told by your health care provider. This is important. This information is not intended to replace advice given to you by your health care provider. Make sure you discuss any questions you have with your health care provider. Document Released: 08/01/2015 Document Revised: 01/02/2018 Document Reviewed: 01/02/2018 Elsevier Interactive Patient Education  2019 [place]   1) The drugs that you were given will stay in your system until tomorrow so for the next 24 hours you should not:  A) Drive an automobile B) Make any legal decisions C) Drink any alcoholic beverage   2) You may resume regular  meals tomorrow.  Today it is better to start with liquids and gradually work up to solid foods.  You may eat anything you prefer, but it is better to start with liquids, then soup and crackers, and gradually work up to solid foods.   3) Please notify your doctor immediately if you have any unusual bleeding, trouble breathing, redness and pain at the surgery site, drainage, fever, or pain not relieved by medication.    4) Additional Instructions:   Please contact your physician with any problems or Same Day Surgery at  [phone], Monday through Friday 6 am to 4 pm, or [place] [facility] number at [phone].

## 2020-11-06 NOTE — Anesthesia Preprocedure Evaluation (Signed)
Anesthesia Evaluation  Patient identified by MRN, date of birth, ID band Patient awake    Reviewed: Allergy & Precautions, H&P , NPO status , reviewed documented beta blocker date and time   Airway Mallampati: III  TM Distance: >3 FB Neck ROM: limited    Dental  (+) Caps   Pulmonary shortness of breath and with exertion, COPD, former smoker,    Pulmonary exam normal        Cardiovascular hypertension, + angina + CAD  Normal cardiovascular exam  RBBB, no sig change on EKG   Neuro/Psych  Neuromuscular disease    GI/Hepatic GERD  Controlled,  Endo/Other    Renal/GU      Musculoskeletal  (+) Arthritis ,   Abdominal   Peds  Hematology   Anesthesia Other Findings Past Medical History: No date: Allergy No date: Arthritis 10/23/2020: 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  ([place]) No date: Emphysema lung ([place]) No date: Esophagitis No date: Gastritis No date: Gastroesophageal reflux disease without esophagitis No date: History of kidney stones No date: Hypercholesteremia No date: Hypertension 04/27/2018: Lipoma of neck No date: Sciatica 2016: Tendonitis, Achilles     Comment:  both  2019: Thyroid nodule Past Surgical History: 1985: ABDOMINAL HYSTERECTOMY     Comment:  total for bleeding No date: APPENDECTOMY 2019: BIOPSY THYROID 2004: BLADDER SUSPENSION 10/09/2020: BREAST BIOPSY; Left     Comment:  [place] Bx, Q-clip, [facility] 03/2018: lipoma excision     Comment:  posterior neck BMI    Body Mass Index: 28.96 kg/m     Reproductive/Obstetrics                             Anesthesia Physical Anesthesia Plan  ASA: III  Anesthesia Plan: General   Post-op Pain Management:    Induction: Intravenous  PONV Risk Score and Plan: 3 and Ondansetron, Midazolam, Treatment may vary due to age or medical condition and Dexamethasone  Airway Management Planned: LMA  Additional  Equipment:   Intra-op Plan:   Post-operative Plan: Extubation in OR  Informed Consent: I have reviewed the patients History and Physical, chart, labs and discussed the procedure including the risks, benefits and alternatives for the proposed anesthesia with the patient or authorized representative who has indicated his/her understanding and acceptance.     Dental Advisory Given  Plan Discussed with: CRNA  Anesthesia Plan Comments:         Anesthesia Quick Evaluation

## 2020-11-06 NOTE — Anesthesia Procedure Notes (Signed)
Procedure Name: LMA Insertion Date/Time: 11/06/2020 9:02 AM Performed by: Lorie Apley, CRNA Pre-anesthesia Checklist: Patient identified, Patient being monitored, Timeout performed, Emergency Drugs available and Suction available Patient Re-evaluated:Patient Re-evaluated prior to induction Oxygen Delivery Method: Circle system utilized Preoxygenation: Pre-oxygenation with 100% oxygen Induction Type: IV induction Ventilation: Mask ventilation without difficulty LMA: LMA inserted LMA Size: 4.0 Tube type: Oral Number of attempts: 1 Placement Confirmation: positive ETCO2 and breath sounds checked- equal and bilateral Tube secured with: Tape Dental Injury: Teeth and Oropharynx as per pre-operative assessment

## 2020-11-06 NOTE — Anesthesia Postprocedure Evaluation (Signed)
Anesthesia Post Note  Patient: Karen Chafe Antillon  Procedure(s) Performed: BREAST LUMPECTOMY,RADIO FREQ LOCALIZER,AXILLARY SENTINEL LYMPH NODE BIOPSY (Left Breast)  Patient location during evaluation: PACU Anesthesia Type: General Level of consciousness: awake and alert Pain management: pain level controlled Vital Signs Assessment: post-procedure vital signs reviewed and stable Respiratory status: spontaneous breathing, nonlabored ventilation and respiratory function stable Cardiovascular status: blood pressure returned to baseline and stable Postop Assessment: no apparent nausea or vomiting Anesthetic complications: no   No complications documented.   Last Vitals:  Vitals:   11/06/20 1114 11/06/20 1221  BP: 129/61 (!) 162/79  Pulse: 65 72  Resp: 16 16  Temp: (!) 36.1 C (!) 36.3 C  SpO2: 93% 97%    Last Pain:  Vitals:   11/06/20 1221  TempSrc: Temporal  PainSc: 3                  Meklit Cotta Harvie Heck

## 2020-11-06 NOTE — Interval H&P Note (Signed)
History and Physical Interval Note:  11/06/2020 8:33 AM  Monique Hall  has presented today for surgery, with the diagnosis of Malignant neoplasm of upper-inner quadrant of lt female breast, unspecified estrogen receptor status.  The various methods of treatment have been discussed with the patient and family. After consideration of risks, benefits and other options for treatment, the patient has consented to  Procedure(s): [place] (Left) as a surgical intervention.  The patient's history has been reviewed, patient examined, no change in status, stable for surgery.  I have reviewed the patient's chart and labs.  Questions were answered to the patient's satisfaction.     [name]

## 2020-11-07 ENCOUNTER — Encounter: Payer: Self-pay | Admitting: Surgery

## 2020-11-09 NOTE — Progress Notes (Signed)
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  INTERVAL HISTORY: Patient returns to clinic today to discuss her final pathology results and treatment planning.  She tolerated her lumpectomy well without significant side effects. She has no neurologic complaints.  She denies any recent fevers or illnesses.  She has a good appetite and denies weight loss.  She has no chest pain, shortness of breath, cough, or hemoptysis.  She denies any nausea, vomiting, constipation, or diarrhea.  She has no urinary complaints.  Patient offers no specific complaints today.  REVIEW OF SYSTEMS:   Review of Systems  Constitutional: Negative.  Negative for fever, malaise/fatigue and weight loss.  Respiratory: Negative.  Negative for cough, hemoptysis and shortness of breath.   Cardiovascular: Negative.  Negative for chest pain and leg swelling.  Gastrointestinal: Negative.  Negative for abdominal pain.  Genitourinary: Negative.  Negative for dysuria.  Musculoskeletal: Negative.  Negative for back pain.  Skin: Negative.  Negative for rash.  Neurological: Negative.  Negative for dizziness, focal weakness, weakness and headaches.  Psychiatric/Behavioral: Negative.  The patient is not nervous/anxious.     As per HPI. Otherwise, a complete review of systems is negative.  PAST MEDICAL HISTORY: Past Medical History:  Diagnosis Date  . Allergy   . Arthritis   . 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 ([place]) 10/23/2020  . Emphysema lung ([place])   . Esophagitis   .  Gastritis   . Gastroesophageal reflux disease without esophagitis   . History of kidney stones   . Hypercholesteremia   . Hypertension   . Lipoma of neck 04/27/2018  . Sciatica   . Tendonitis, Achilles 2016   both   . Thyroid nodule 2019    PAST SURGICAL HISTORY: Past Surgical History:  Procedure Laterality Date  . ABDOMINAL HYSTERECTOMY  1985   total for bleeding  . APPENDECTOMY    . BIOPSY THYROID  2019  . BLADDER SUSPENSION  2004  . BREAST BIOPSY Left 10/09/2020   [place] Bx, Q-clip, IMC  . BREAST LUMPECTOMY,RADIO FREQ LOCALIZER,AXILLARY SENTINEL LYMPH NODE BIOPSY Left 11/06/2020   Procedure: BREAST LUMPECTOMY,RADIO FREQ LOCALIZER,AXILLARY SENTINEL LYMPH NODE BIOPSY;  Surgeon: Benjamine Sprague, DO;  Location: [facility] ORS;  Service: General;  Laterality: Left;  . lipoma excision  03/2018   posterior neck    FAMILY HISTORY: Family History  Problem Relation Age of Onset  . CAD Mother   . Diabetes Mother   . Arthritis Mother   . Hypertension Mother   . Parkinson's disease Father   . COPD Father   . Hearing loss Father   . Breast cancer Neg Hx     ADVANCED DIRECTIVES (Y/N):  N  HEALTH MAINTENANCE: Social History   Tobacco Use  . Smoking status: Former Smoker    Packs/day: 1.00    Years: 35.00    Pack years: 35.00    Types: Cigarettes    Quit date: 09/01/2002    Years since quitting: 18.2  . Smokeless tobacco: Never Used  . Tobacco comment: smoking cessation materials not required  Vaping Use  . Vaping Use: Never used  Substance  Use Topics  . Alcohol use: Yes    Alcohol/week: 0.0 standard drinks    Comment: RARE  . Drug use: No     Colonoscopy:  PAP:  Bone density:  Lipid panel:  Allergies  Allergen Reactions  . Baclofen     Other reaction(s): Unknown  . Etodolac Nausea Only    Current Outpatient Medications  Medication Sig Dispense Refill  . acetaminophen (TYLENOL) 500 MG tablet Take 1 tablet (500 mg total) by mouth every 6 (six) hours as needed for mild  pain or moderate pain. For shoulder pain    . aspirin EC 81 MG tablet Take 81 mg by mouth daily.    . Black Cohosh 200 MG CAPS Take 200 mg by mouth daily. Reported on 08/20/2015    . Black Elderberry (SAMBUCUS ELDERBERRY PO) Take 1 each by mouth daily. gummy    . ezetimibe (ZETIA) 10 MG tablet TAKE 1 TABLET EVERY DAY (Patient taking differently: Take 10 mg by mouth daily.) 90 tablet 0  . ibuprofen (ADVIL) 200 MG tablet Take 400 mg by mouth every 8 (eight) hours as needed for moderate pain.    Marland Kitchen loratadine (CLARITIN) 10 MG tablet Take 10 mg by mouth daily.    Marland Kitchen losartan-hydrochlorothiazide (HYZAAR) 100-12.5 MG tablet TAKE 1 TABLET EVERY DAY (Patient taking differently: Take 1 tablet by mouth daily.) 90 tablet 0  . triamcinolone (NASACORT) 55 MCG/ACT AERO nasal inhaler Place 1 spray into the nose at bedtime as needed (allergies).    . docusate sodium (COLACE) 100 MG capsule Take 1 capsule (100 mg total) by mouth 2 (two) times daily as needed for up to 10 days for mild constipation. 20 capsule 0  . HYDROcodone-acetaminophen (NORCO) 5-325 MG tablet Take 1 tablet by mouth every 6 (six) hours as needed for up to 6 doses for moderate pain. 6 tablet 0   No current facility-administered medications for this visit.    OBJECTIVE: Vitals:   11/13/20 1002  BP: (!) 143/72  Pulse: 87  Resp: 17  Temp: 98 F (36.7 C)  SpO2: 96%     Body mass index is 29.14 kg/m.    ECOG FS:0 - Asymptomatic  General: Well-developed, well-nourished, no acute distress. Eyes: Pink conjunctiva, anicteric sclera. HEENT: Normocephalic, moist mucous membranes. Lungs: No audible wheezing or coughing. Heart: Regular rate and rhythm. Abdomen: Soft, nontender, no obvious distention. Musculoskeletal: No edema, cyanosis, or clubbing. Neuro: Alert, answering all questions appropriately. Cranial nerves grossly intact. Skin: No rashes or petechiae noted. Psych: Normal affect.   LAB RESULTS:  Lab Results  Component Value Date    NA 139 09/25/2020   K 4.3 09/25/2020   CL 101 09/25/2020   CO2 21 09/25/2020   GLUCOSE 106 (H) 09/25/2020   BUN 19 09/25/2020   CREATININE 1.12 (H) 09/25/2020   CALCIUM 10.1 09/25/2020   PROT 7.1 09/25/2020   ALBUMIN 4.4 09/25/2020   AST 18 09/25/2020   ALT 18 09/25/2020   ALKPHOS 48 09/25/2020   BILITOT 0.9 09/25/2020   GFRNONAA 55 (L) 09/20/2019   GFRAA 63 09/20/2019    Lab Results  Component Value Date   WBC 7.8 09/25/2020   NEUTROABS 3.9 09/25/2020   HGB 12.4 09/25/2020   HCT 37.1 09/25/2020   MCV 89 09/25/2020   PLT 414 09/25/2020     STUDIES: NM Sentinel Node Inj-No Rpt (Breast)  Result Date: 11/06/2020 Sulfur colloid was injected by the nuclear medicine technologist for melanoma sentinel node.   MM BREAST SURGICAL  SPECIMEN  Result Date: 11/06/2020 CLINICAL DATA:  Post left breast lumpectomy. EXAM: SPECIMEN RADIOGRAPH OF THE LEFT BREAST COMPARISON:  Previous exam(s). FINDINGS: Status post excision of the left breast. The radioactive seed and biopsy marker clip are present, completely intact, and were marked for pathology. The radioactive seed is located outside of the specimen, within the specimen container. IMPRESSION: Specimen radiograph of the left breast. Electronically Signed   By: Fidela Salisbury M.D.   On: 11/06/2020 09:46   MM CLIP PLACEMENT LEFT  Result Date: 10/14/2020 CLINICAL DATA:  Post procedure mammogram for clip placement. EXAM: DIAGNOSTIC LEFT MAMMOGRAM POST ULTRASOUND BIOPSY COMPARISON:  Previous exam(s). FINDINGS: Mammographic images were obtained following ultrasound guided biopsy of a left breast mass at 11 o'clock. The biopsy marking clip is in expected position at the site of biopsy. IMPRESSION: Appropriate positioning of the Q shaped biopsy marking clip at the site of biopsy in the left breast at 11 o'clock. Final Assessment: Post Procedure Mammograms for Marker Placement Electronically Signed   By: Audie Pinto M.D.   On: 10/14/2020  13:56   [place] LT BREAST BX W LOC DEV 1ST LESION IMG BX SPEC US GUIDE  Addendum Date: 10/16/2020   ADDENDUM REPORT: 10/16/2020 14:28 ADDENDUM: PATHOLOGY revealed: A. BREAST, LEFT 11:00 6 CM FN; ULTRASOUND-GUIDED BIOPSY: - INVASIVE MAMMARY CARCINOMA, NO SPECIAL TYPE. Size of invasive carcinoma: 5 mm in this sample. Grade 1. Ductal carcinoma in situ: Not identified. Lymphovascular invasion: Not identified. Pathology results are CONCORDANT with imaging findings, per Dr. Audie Pinto. Pathology results and recommendations below were discussed with patient by telephone on 10/16/2020. Patient reported biopsy site within normal limits with slight tenderness at the site, and no significant bruising. Post biopsy care instructions were reviewed, questions were answered and my direct phone number was provided to patient. Patient was instructed to call [facility] if any concerns or questions arise related to the biopsy. Recommend surgical consultation: Request for surgical consultation relayed to Al Pimple RN and Tanya Nones RN at [facility] by Electa Sniff RN on 10/16/2020. Pathology results reported by Electa Sniff RN on 10/16/2020. Electronically Signed   By: Audie Pinto M.D.   On: 10/16/2020 14:28   Result Date: 10/16/2020 CLINICAL DATA:  80 year old female presenting for biopsy of a left breast mass. EXAM: ULTRASOUND GUIDED LEFT BREAST CORE NEEDLE BIOPSY COMPARISON:  Previous exam(s). PROCEDURE: I met with the patient and we discussed the procedure of ultrasound-guided biopsy, including benefits and alternatives. We discussed the high likelihood of a successful procedure. We discussed the risks of the procedure, including infection, bleeding, tissue injury, clip migration, and inadequate sampling. Informed written consent was given. The usual time-out protocol was performed immediately prior to the procedure. Lesion quadrant: Upper inner quadrant Using sterile technique and 1%  Lidocaine as local anesthetic, under direct ultrasound visualization, a 14 gauge spring-loaded device was used to perform biopsy of a left breast mass at 11 o'clock using a medial approach. At the conclusion of the procedure a Q tissue marker clip was deployed into the biopsy cavity. Follow up 2 view mammogram was performed and dictated separately. IMPRESSION: Ultrasound guided biopsy of a left breast mass at 11 o'clock. No apparent complications. Electronically Signed: By: Audie Pinto M.D. On: 10/14/2020 13:57   MM LT RADIO FREQUENCY TAG LOC MAMMO GUIDE  Result Date: 11/03/2020 CLINICAL DATA:  80 year old with a biopsy-proven invasive mammary carcinoma of no special type involving the UPPER INNER QUADRANT of the LEFT breast. Radiofrequency  tag localization is performed in anticipation of lumpectomy which is scheduled for 11/06/2020. EXAM: NEEDLE LOCALIZATION OF THE LEFT BREAST WITH MAMMO GUIDANCE COMPARISON:  Previous exams. FINDINGS: Patient presents for needle localization prior to LEFT breast lumpectomy. I met with the patient and we discussed the procedure of needle localization including benefits and alternatives. We discussed the high likelihood of a successful procedure. We discussed the risks of the procedure, including infection, bleeding, tissue injury, and further surgery. Informed, written consent was given. The usual time-out protocol was performed immediately prior to the procedure. Using mammographic guidance, sterile technique with chlorhexidine as skin antisepsis, 1% lidocaine as local anesthetic, a 7 cm LOCalizer radiofrequency tag needle was used to localize the Q shaped tissue marking clip associated with the biopsy-proven malignancy using a superior approach. Radiofrequency tag function was confirmed with an auditory signal. The tag is approximately 10 mm ANTERIOR to the Q clip. The images are marked for Dr. Lysle Pearl. IMPRESSION: Radiofrequency tag localization of the LEFT breast. The  tag is approximately 10 mm ANTERIOR to the Q clip. No apparent complications. Electronically Signed   By: Evangeline Dakin M.D.   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  Patient does not require adjuvant chemotherapy.  She has an appointment with radiation oncology today to discuss adjuvant XRT.  No further interventions are needed at this time.  Patient will return to clinic at the end of her radiation for further evaluation and initiation of an aromatase inhibitor.    I spent a total of 30 minutes reviewing chart data, face-to-face evaluation with the patient, counseling and coordination of care as detailed above.  Patient expressed understanding and was in agreement with this plan. She also understands that She can call clinic at any time with any questions, concerns, or complaints.   Cancer Staging 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 [facility]) Staging form: Breast, AJCC 8th Edition - Pathologic stage from 11/13/2020: Stage IA (pT1a, pN0, cM0, G1, ER+, PR+, HER2-) - Signed by Lloyd Huger, MD on 11/13/2020 Stage prefix: Initial diagnosis Histologic grading system: 3 grade system   Lloyd Huger, MD   11/13/2020 11:47 AM

## 2020-11-10 ENCOUNTER — Other Ambulatory Visit: Payer: Self-pay | Admitting: Pathology

## 2020-11-10 LAB — SURGICAL PATHOLOGY

## 2020-11-12 ENCOUNTER — Ambulatory Visit: Payer: Medicare Other | Admitting: Oncology

## 2020-11-13 ENCOUNTER — Inpatient Hospital Stay (HOSPITAL_BASED_OUTPATIENT_CLINIC_OR_DEPARTMENT_OTHER): Payer: Medicare Other | Admitting: Oncology

## 2020-11-13 ENCOUNTER — Other Ambulatory Visit: Payer: Self-pay

## 2020-11-13 ENCOUNTER — Encounter: Payer: Self-pay | Admitting: Oncology

## 2020-11-13 ENCOUNTER — Ambulatory Visit
Admission: RE | Admit: 2020-11-13 | Discharge: 2020-11-13 | Disposition: A | Discharge status: Home / Self Care | Payer: Medicare Other | Source: Ambulatory Visit | Attending: Radiation Oncology | Admitting: Radiation Oncology

## 2020-11-13 VITALS — BP 143/72 | HR 87 | Temp 98.0°F | Resp 17 | Wt 175.1 lb

## 2020-11-13 DIAGNOSIS — Z170 Estrogen receptor positive status [ER+]: Secondary | ICD-10-CM | POA: Diagnosis not present

## 2020-11-13 DIAGNOSIS — E7800 Pure hypercholesterolemia, unspecified: Secondary | ICD-10-CM | POA: Diagnosis not present

## 2020-11-13 DIAGNOSIS — C50212 Malignant neoplasm of upper-inner quadrant of left female breast: Secondary | ICD-10-CM

## 2020-11-13 DIAGNOSIS — I25118 Atherosclerotic heart disease of native coronary artery with other forms of angina pectoris: Secondary | ICD-10-CM | POA: Diagnosis not present

## 2020-11-13 DIAGNOSIS — E041 Nontoxic single thyroid nodule: Secondary | ICD-10-CM | POA: Insufficient documentation

## 2020-11-13 DIAGNOSIS — Z87891 Personal history of nicotine dependence: Secondary | ICD-10-CM | POA: Insufficient documentation

## 2020-11-13 DIAGNOSIS — Z79899 Other long term (current) drug therapy: Secondary | ICD-10-CM | POA: Insufficient documentation

## 2020-11-13 DIAGNOSIS — I10 Essential (primary) hypertension: Secondary | ICD-10-CM | POA: Insufficient documentation

## 2020-11-13 DIAGNOSIS — Z87442 Personal history of urinary calculi: Secondary | ICD-10-CM | POA: Insufficient documentation

## 2020-11-13 DIAGNOSIS — K219 Gastro-esophageal reflux disease without esophagitis: Secondary | ICD-10-CM | POA: Diagnosis not present

## 2020-11-13 DIAGNOSIS — M129 Arthropathy, unspecified: Secondary | ICD-10-CM | POA: Diagnosis not present

## 2020-11-13 DIAGNOSIS — J439 Emphysema, unspecified: Secondary | ICD-10-CM | POA: Diagnosis not present

## 2020-11-13 NOTE — Consult Note (Signed)
NEW PATIENT EVALUATION  Name: Monique Hall  MRN: [id number]  Date:   11/13/2020     DOB: [birth year]   This 80 y.o. female patient presents to the clinic for initial evaluation of stage Ia (T1 a N0 M0) ER/PR positive invasive mammary carcinoma of the left breast status post wide local excision and sentinel node biopsy.Marland Kitchen  REFERRING PHYSICIAN: Glean Hess, MD  CHIEF COMPLAINT:  Chief Complaint  Patient presents with  . Breast Cancer    Initial consultation    DIAGNOSIS: The encounter diagnosis was Malignant neoplasm of upper-inner quadrant of left breast in female, estrogen receptor positive ([place]).   PREVIOUS INVESTIGATIONS:  Mammogram and ultrasound reviewed Pathology report reviewed Clinical notes reviewed  HPI: Patient is a 80 year old female who presented with an abnormal mammogram of her left breast showing a 3 mm area of asymmetry that was confirmed on tomosynthesis and ultrasound.  Ultrasound-guided biopsy was positive for invasive mammary carcinoma.  Axillary lymph nodes were normal.  She went on to have a wide local excision and sentinel node biopsy.  Tumor was overall grade 2 5 mm with margins clear at 3.4 mm.  1 sentinel lymph node was negative for metastatic disease.  She is seen today for radiation oncology opinion.  She is seen medical oncology with recommendation for antiestrogen therapy after completion of radiation she specifically denies cough or bone pain she still somewhat tender.  PLANNED TREATMENT REGIMEN: Left hypofractionated whole breast radiation  PAST MEDICAL HISTORY:  has a past medical history of Allergy, Arthritis, 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 ([place]) (10/23/2020), Emphysema lung ([place]), Esophagitis, Gastritis, Gastroesophageal reflux disease without esophagitis, History of kidney stones, Hypercholesteremia, Hypertension, Lipoma of neck (04/27/2018), Sciatica, Tendonitis, Achilles (2016), and Thyroid nodule (2019).    PAST SURGICAL  HISTORY:  Past Surgical History:  Procedure Laterality Date  . ABDOMINAL HYSTERECTOMY  1985   total for bleeding  . APPENDECTOMY    . BIOPSY THYROID  2019  . BLADDER SUSPENSION  2004  . BREAST BIOPSY Left 10/09/2020   [place] Bx, Q-clip, IMC  . BREAST LUMPECTOMY,RADIO FREQ LOCALIZER,AXILLARY SENTINEL LYMPH NODE BIOPSY Left 11/06/2020   Procedure: BREAST LUMPECTOMY,RADIO FREQ LOCALIZER,AXILLARY SENTINEL LYMPH NODE BIOPSY;  Surgeon: Benjamine Sprague, DO;  Location: [facility] ORS;  Service: General;  Laterality: Left;  . lipoma excision  03/2018   posterior neck    FAMILY HISTORY: family history includes Arthritis in her mother; CAD in her mother; COPD in her father; Diabetes in her mother; Hearing loss in her father; Hypertension in her mother; Parkinson's disease in her father.  SOCIAL HISTORY:  reports that she quit smoking about 18 years ago. Her smoking use included cigarettes. She has a 35.00 pack-year smoking history. She has never used smokeless tobacco. She reports current alcohol use. She reports that she does not use drugs.  ALLERGIES: Baclofen and Etodolac  MEDICATIONS:  Current Outpatient Medications  Medication Sig Dispense Refill  . acetaminophen (TYLENOL) 500 MG tablet Take 1 tablet (500 mg total) by mouth every 6 (six) hours as needed for mild pain or moderate pain. For shoulder pain    . aspirin EC 81 MG tablet Take 81 mg by mouth daily.    . Black Cohosh 200 MG CAPS Take 200 mg by mouth daily. Reported on 08/20/2015    . Black Elderberry (SAMBUCUS ELDERBERRY PO) Take 1 each by mouth daily. gummy    . docusate sodium (COLACE) 100 MG capsule Take 1 capsule (100 mg total) by  mouth 2 (two) times daily as needed for up to 10 days for mild constipation. 20 capsule 0  . ezetimibe (ZETIA) 10 MG tablet TAKE 1 TABLET EVERY DAY (Patient taking differently: Take 10 mg by mouth daily.) 90 tablet 0  . HYDROcodone-acetaminophen (NORCO) 5-325 MG tablet Take 1 tablet by mouth every 6 (six) hours as  needed for up to 6 doses for moderate pain. 6 tablet 0  . ibuprofen (ADVIL) 200 MG tablet Take 400 mg by mouth every 8 (eight) hours as needed for moderate pain.    Marland Kitchen loratadine (CLARITIN) 10 MG tablet Take 10 mg by mouth daily.    Marland Kitchen losartan-hydrochlorothiazide (HYZAAR) 100-12.5 MG tablet TAKE 1 TABLET EVERY DAY (Patient taking differently: Take 1 tablet by mouth daily.) 90 tablet 0  . triamcinolone (NASACORT) 55 MCG/ACT AERO nasal inhaler Place 1 spray into the nose at bedtime as needed (allergies).     No current facility-administered medications for this encounter.    ECOG PERFORMANCE STATUS:  0 - Asymptomatic  REVIEW OF SYSTEMS: Patient denies any weight loss, fatigue, weakness, fever, chills or night sweats. Patient denies any loss of vision, blurred vision. Patient denies any ringing  of the ears or hearing loss. No irregular heartbeat. Patient denies heart murmur or history of fainting. Patient denies any chest pain or pain radiating to her upper extremities. Patient denies any shortness of breath, difficulty breathing at night, cough or hemoptysis. Patient denies any swelling in the lower legs. Patient denies any nausea vomiting, vomiting of blood, or coffee ground material in the vomitus. Patient denies any stomach pain. Patient states has had normal bowel movements no significant constipation or diarrhea. Patient denies any dysuria, hematuria or significant nocturia. Patient denies any problems walking, swelling in the joints or loss of balance. Patient denies any skin changes, loss of hair or loss of weight. Patient denies any excessive worrying or anxiety or significant depression. Patient denies any problems with insomnia. Patient denies excessive thirst, polyuria, polydipsia. Patient denies any swollen glands, patient denies easy bruising or easy bleeding. Patient denies any recent infections, allergies or URI. Patient "s visual fields have not changed significantly in recent time.    PHYSICAL EXAM: There were no vitals taken for this visit. Patient is still somewhat tender in the left breast.  Incisions are healing well she has some ecchymosis surrounding her incision sites.  No dominant masses noted in either breast.  No axillary or supraclavicular adenopathy is appreciated.  Well-developed well-nourished patient in NAD. HEENT reveals PERLA, EOMI, discs not visualized.  Oral cavity is clear. No oral mucosal lesions are identified. Neck is clear without evidence of cervical or supraclavicular adenopathy. Lungs are clear to A&P. Cardiac examination is essentially unremarkable with regular rate and rhythm without murmur rub or thrill. Abdomen is benign with no organomegaly or masses noted. Motor sensory and DTR levels are equal and symmetric in the upper and lower extremities. Cranial nerves II through XII are grossly intact. Proprioception is intact. No peripheral adenopathy or edema is identified. No motor or sensory levels are noted. Crude visual fields are within normal range.  LABORATORY DATA: Pathology report reviewed    RADIOLOGY RESULTS: Mammogram and ultrasound reviewed compatible with above-stated findings   IMPRESSION: Stage Ia invasive mammary carcinoma ER/PR positive of left breast status post wide local excision and sentinel node biopsy in 80 year old female  PLAN: At this time I have recommended a hypofractionated course of whole breast radiation to her left breast over 3 weeks.  Would also boost her scar another 1000 cGy using electron beam.  Risks and benefits of treatment including skin reaction fatigue alteration of blood counts possible inclusion of superficial lung all were described in detail to the patient.  I personally set up and ordered CT simulation for next week to allow some further healing.  Patient also will be a candidate for antiestrogen therapy after completion of radiation.  I would like to take this opportunity to thank you for allowing me to  participate in the care of your patient.Noreene Filbert, MD

## 2020-11-20 ENCOUNTER — Ambulatory Visit
Admission: RE | Admit: 2020-11-20 | Discharge: 2020-11-20 | Disposition: A | Discharge status: Home / Self Care | Payer: Medicare Other | Source: Ambulatory Visit | Attending: Radiation Oncology | Admitting: Radiation Oncology

## 2020-11-20 DIAGNOSIS — Z170 Estrogen receptor positive status [ER+]: Secondary | ICD-10-CM | POA: Insufficient documentation

## 2020-11-20 DIAGNOSIS — I10 Essential (primary) hypertension: Secondary | ICD-10-CM | POA: Insufficient documentation

## 2020-11-20 DIAGNOSIS — I2510 Atherosclerotic heart disease of native coronary artery without angina pectoris: Secondary | ICD-10-CM | POA: Insufficient documentation

## 2020-11-20 DIAGNOSIS — Z87891 Personal history of nicotine dependence: Secondary | ICD-10-CM | POA: Diagnosis not present

## 2020-11-20 DIAGNOSIS — Z79899 Other long term (current) drug therapy: Secondary | ICD-10-CM | POA: Insufficient documentation

## 2020-11-20 DIAGNOSIS — Z7982 Long term (current) use of aspirin: Secondary | ICD-10-CM | POA: Insufficient documentation

## 2020-11-20 DIAGNOSIS — E785 Hyperlipidemia, unspecified: Secondary | ICD-10-CM | POA: Diagnosis not present

## 2020-11-20 DIAGNOSIS — E7800 Pure hypercholesterolemia, unspecified: Secondary | ICD-10-CM | POA: Insufficient documentation

## 2020-11-20 DIAGNOSIS — Z510 Encounter for antineoplastic radiation therapy: Secondary | ICD-10-CM | POA: Insufficient documentation

## 2020-11-20 DIAGNOSIS — C50212 Malignant neoplasm of upper-inner quadrant of left female breast: Secondary | ICD-10-CM | POA: Insufficient documentation

## 2020-11-21 ENCOUNTER — Other Ambulatory Visit: Payer: Self-pay | Admitting: *Deleted

## 2020-11-21 DIAGNOSIS — C50212 Malignant neoplasm of upper-inner quadrant of left female breast: Secondary | ICD-10-CM

## 2020-11-21 DIAGNOSIS — Z170 Estrogen receptor positive status [ER+]: Secondary | ICD-10-CM

## 2020-11-22 DIAGNOSIS — E785 Hyperlipidemia, unspecified: Secondary | ICD-10-CM | POA: Diagnosis not present

## 2020-11-22 DIAGNOSIS — Z170 Estrogen receptor positive status [ER+]: Secondary | ICD-10-CM | POA: Diagnosis not present

## 2020-11-22 DIAGNOSIS — C50212 Malignant neoplasm of upper-inner quadrant of left female breast: Secondary | ICD-10-CM | POA: Diagnosis not present

## 2020-11-22 DIAGNOSIS — E7800 Pure hypercholesterolemia, unspecified: Secondary | ICD-10-CM | POA: Diagnosis not present

## 2020-11-22 DIAGNOSIS — Z510 Encounter for antineoplastic radiation therapy: Secondary | ICD-10-CM | POA: Diagnosis not present

## 2020-11-22 DIAGNOSIS — Z87891 Personal history of nicotine dependence: Secondary | ICD-10-CM | POA: Diagnosis not present

## 2020-11-24 ENCOUNTER — Telehealth: Payer: Self-pay | Admitting: *Deleted

## 2020-11-24 DIAGNOSIS — C50212 Malignant neoplasm of upper-inner quadrant of left female breast: Secondary | ICD-10-CM

## 2020-11-24 NOTE — Telephone Encounter (Signed)
Patient called upset stating that she got notification of an AVS and that there are things on it that are incorrect. She states it says that she was accompanied by her husband, but her husband is deceased so there was no way that he was there at her visit. She states it also says something about her seeing Ivin Booty B in research and that she knows nothing about this either. I called and spoke with Ivin Booty as she has a note in from today and Ivin Booty will call patient to look more into this situation

## 2020-11-24 NOTE — Research (Addendum)
Erroneous entry

## 2020-11-26 ENCOUNTER — Ambulatory Visit: Admission: RE | Admit: 2020-11-26 | Payer: Medicare Other | Source: Ambulatory Visit

## 2020-11-26 DIAGNOSIS — E7800 Pure hypercholesterolemia, unspecified: Secondary | ICD-10-CM | POA: Diagnosis not present

## 2020-11-26 DIAGNOSIS — Z170 Estrogen receptor positive status [ER+]: Secondary | ICD-10-CM | POA: Diagnosis not present

## 2020-11-26 DIAGNOSIS — E785 Hyperlipidemia, unspecified: Secondary | ICD-10-CM | POA: Diagnosis not present

## 2020-11-26 DIAGNOSIS — C50212 Malignant neoplasm of upper-inner quadrant of left female breast: Secondary | ICD-10-CM | POA: Diagnosis not present

## 2020-11-26 DIAGNOSIS — Z87891 Personal history of nicotine dependence: Secondary | ICD-10-CM | POA: Diagnosis not present

## 2020-11-26 DIAGNOSIS — Z510 Encounter for antineoplastic radiation therapy: Secondary | ICD-10-CM | POA: Diagnosis not present

## 2020-11-27 ENCOUNTER — Ambulatory Visit
Admission: RE | Admit: 2020-11-27 | Discharge: 2020-11-27 | Disposition: A | Discharge status: Home / Self Care | Payer: Medicare Other | Source: Ambulatory Visit | Attending: Radiation Oncology | Admitting: Radiation Oncology

## 2020-11-27 DIAGNOSIS — Z170 Estrogen receptor positive status [ER+]: Secondary | ICD-10-CM | POA: Diagnosis not present

## 2020-11-27 DIAGNOSIS — Z510 Encounter for antineoplastic radiation therapy: Secondary | ICD-10-CM | POA: Diagnosis not present

## 2020-11-27 DIAGNOSIS — C50212 Malignant neoplasm of upper-inner quadrant of left female breast: Secondary | ICD-10-CM | POA: Diagnosis not present

## 2020-11-27 DIAGNOSIS — E7800 Pure hypercholesterolemia, unspecified: Secondary | ICD-10-CM | POA: Diagnosis not present

## 2020-11-27 DIAGNOSIS — Z87891 Personal history of nicotine dependence: Secondary | ICD-10-CM | POA: Diagnosis not present

## 2020-11-27 DIAGNOSIS — E785 Hyperlipidemia, unspecified: Secondary | ICD-10-CM | POA: Diagnosis not present

## 2020-11-28 ENCOUNTER — Ambulatory Visit
Admission: RE | Admit: 2020-11-28 | Discharge: 2020-11-28 | Disposition: A | Discharge status: Home / Self Care | Payer: Medicare Other | Source: Ambulatory Visit | Attending: Radiation Oncology | Admitting: Radiation Oncology

## 2020-11-28 DIAGNOSIS — C50212 Malignant neoplasm of upper-inner quadrant of left female breast: Secondary | ICD-10-CM | POA: Diagnosis not present

## 2020-11-28 DIAGNOSIS — E785 Hyperlipidemia, unspecified: Secondary | ICD-10-CM | POA: Diagnosis not present

## 2020-11-28 DIAGNOSIS — Z510 Encounter for antineoplastic radiation therapy: Secondary | ICD-10-CM | POA: Diagnosis not present

## 2020-11-28 DIAGNOSIS — Z170 Estrogen receptor positive status [ER+]: Secondary | ICD-10-CM | POA: Diagnosis not present

## 2020-11-28 DIAGNOSIS — Z87891 Personal history of nicotine dependence: Secondary | ICD-10-CM | POA: Diagnosis not present

## 2020-11-28 DIAGNOSIS — E7800 Pure hypercholesterolemia, unspecified: Secondary | ICD-10-CM | POA: Diagnosis not present

## 2020-12-01 ENCOUNTER — Ambulatory Visit
Admission: RE | Admit: 2020-12-01 | Discharge: 2020-12-01 | Disposition: A | Discharge status: Home / Self Care | Payer: Medicare Other | Source: Ambulatory Visit | Attending: Radiation Oncology | Admitting: Radiation Oncology

## 2020-12-01 DIAGNOSIS — Z510 Encounter for antineoplastic radiation therapy: Secondary | ICD-10-CM | POA: Diagnosis not present

## 2020-12-01 DIAGNOSIS — Z87891 Personal history of nicotine dependence: Secondary | ICD-10-CM | POA: Diagnosis not present

## 2020-12-01 DIAGNOSIS — Z170 Estrogen receptor positive status [ER+]: Secondary | ICD-10-CM | POA: Diagnosis not present

## 2020-12-01 DIAGNOSIS — E785 Hyperlipidemia, unspecified: Secondary | ICD-10-CM | POA: Diagnosis not present

## 2020-12-01 DIAGNOSIS — C50212 Malignant neoplasm of upper-inner quadrant of left female breast: Secondary | ICD-10-CM | POA: Diagnosis not present

## 2020-12-01 DIAGNOSIS — E7800 Pure hypercholesterolemia, unspecified: Secondary | ICD-10-CM | POA: Diagnosis not present

## 2020-12-02 ENCOUNTER — Ambulatory Visit
Admission: RE | Admit: 2020-12-02 | Discharge: 2020-12-02 | Disposition: A | Discharge status: Home / Self Care | Payer: Medicare Other | Source: Ambulatory Visit | Attending: Radiation Oncology | Admitting: Radiation Oncology

## 2020-12-02 DIAGNOSIS — Z510 Encounter for antineoplastic radiation therapy: Secondary | ICD-10-CM | POA: Diagnosis not present

## 2020-12-02 DIAGNOSIS — E7800 Pure hypercholesterolemia, unspecified: Secondary | ICD-10-CM | POA: Diagnosis not present

## 2020-12-02 DIAGNOSIS — Z87891 Personal history of nicotine dependence: Secondary | ICD-10-CM | POA: Diagnosis not present

## 2020-12-02 DIAGNOSIS — E785 Hyperlipidemia, unspecified: Secondary | ICD-10-CM | POA: Diagnosis not present

## 2020-12-02 DIAGNOSIS — Z170 Estrogen receptor positive status [ER+]: Secondary | ICD-10-CM | POA: Diagnosis not present

## 2020-12-02 DIAGNOSIS — C50212 Malignant neoplasm of upper-inner quadrant of left female breast: Secondary | ICD-10-CM | POA: Diagnosis not present

## 2020-12-03 ENCOUNTER — Ambulatory Visit
Admission: RE | Admit: 2020-12-03 | Discharge: 2020-12-03 | Disposition: A | Discharge status: Home / Self Care | Payer: Medicare Other | Source: Ambulatory Visit | Attending: Radiation Oncology | Admitting: Radiation Oncology

## 2020-12-03 DIAGNOSIS — C50212 Malignant neoplasm of upper-inner quadrant of left female breast: Secondary | ICD-10-CM | POA: Diagnosis not present

## 2020-12-03 DIAGNOSIS — E785 Hyperlipidemia, unspecified: Secondary | ICD-10-CM | POA: Diagnosis not present

## 2020-12-03 DIAGNOSIS — Z87891 Personal history of nicotine dependence: Secondary | ICD-10-CM | POA: Diagnosis not present

## 2020-12-03 DIAGNOSIS — E7800 Pure hypercholesterolemia, unspecified: Secondary | ICD-10-CM | POA: Diagnosis not present

## 2020-12-03 DIAGNOSIS — Z170 Estrogen receptor positive status [ER+]: Secondary | ICD-10-CM | POA: Diagnosis not present

## 2020-12-03 DIAGNOSIS — Z510 Encounter for antineoplastic radiation therapy: Secondary | ICD-10-CM | POA: Diagnosis not present

## 2020-12-04 ENCOUNTER — Ambulatory Visit
Admission: RE | Admit: 2020-12-04 | Discharge: 2020-12-04 | Disposition: A | Discharge status: Home / Self Care | Payer: Medicare Other | Source: Ambulatory Visit | Attending: Radiation Oncology | Admitting: Radiation Oncology

## 2020-12-04 DIAGNOSIS — Z170 Estrogen receptor positive status [ER+]: Secondary | ICD-10-CM | POA: Diagnosis not present

## 2020-12-04 DIAGNOSIS — E7800 Pure hypercholesterolemia, unspecified: Secondary | ICD-10-CM | POA: Diagnosis not present

## 2020-12-04 DIAGNOSIS — Z510 Encounter for antineoplastic radiation therapy: Secondary | ICD-10-CM | POA: Diagnosis not present

## 2020-12-04 DIAGNOSIS — C50212 Malignant neoplasm of upper-inner quadrant of left female breast: Secondary | ICD-10-CM | POA: Diagnosis not present

## 2020-12-04 DIAGNOSIS — E785 Hyperlipidemia, unspecified: Secondary | ICD-10-CM | POA: Diagnosis not present

## 2020-12-04 DIAGNOSIS — Z87891 Personal history of nicotine dependence: Secondary | ICD-10-CM | POA: Diagnosis not present

## 2020-12-05 ENCOUNTER — Ambulatory Visit
Admission: RE | Admit: 2020-12-05 | Discharge: 2020-12-05 | Disposition: A | Discharge status: Home / Self Care | Payer: Medicare Other | Source: Ambulatory Visit | Attending: Oncology | Admitting: Oncology

## 2020-12-05 ENCOUNTER — Other Ambulatory Visit: Payer: Self-pay

## 2020-12-05 ENCOUNTER — Encounter: Payer: Self-pay | Admitting: *Deleted

## 2020-12-05 ENCOUNTER — Ambulatory Visit
Admission: RE | Admit: 2020-12-05 | Discharge: 2020-12-05 | Disposition: A | Discharge status: Home / Self Care | Payer: Medicare Other | Source: Ambulatory Visit | Attending: Radiation Oncology | Admitting: Radiation Oncology

## 2020-12-05 DIAGNOSIS — E7800 Pure hypercholesterolemia, unspecified: Secondary | ICD-10-CM | POA: Diagnosis not present

## 2020-12-05 DIAGNOSIS — J984 Other disorders of lung: Secondary | ICD-10-CM | POA: Diagnosis not present

## 2020-12-05 DIAGNOSIS — Z510 Encounter for antineoplastic radiation therapy: Secondary | ICD-10-CM | POA: Diagnosis not present

## 2020-12-05 DIAGNOSIS — C50212 Malignant neoplasm of upper-inner quadrant of left female breast: Secondary | ICD-10-CM | POA: Diagnosis not present

## 2020-12-05 DIAGNOSIS — R911 Solitary pulmonary nodule: Secondary | ICD-10-CM | POA: Insufficient documentation

## 2020-12-05 DIAGNOSIS — Z170 Estrogen receptor positive status [ER+]: Secondary | ICD-10-CM | POA: Diagnosis not present

## 2020-12-05 DIAGNOSIS — E785 Hyperlipidemia, unspecified: Secondary | ICD-10-CM | POA: Diagnosis not present

## 2020-12-05 DIAGNOSIS — I2510 Atherosclerotic heart disease of native coronary artery without angina pectoris: Secondary | ICD-10-CM | POA: Diagnosis not present

## 2020-12-05 DIAGNOSIS — J432 Centrilobular emphysema: Secondary | ICD-10-CM | POA: Diagnosis not present

## 2020-12-05 DIAGNOSIS — J8410 Pulmonary fibrosis, unspecified: Secondary | ICD-10-CM | POA: Diagnosis not present

## 2020-12-05 DIAGNOSIS — Z87891 Personal history of nicotine dependence: Secondary | ICD-10-CM | POA: Diagnosis not present

## 2020-12-08 ENCOUNTER — Ambulatory Visit
Admission: RE | Admit: 2020-12-08 | Discharge: 2020-12-08 | Disposition: A | Discharge status: Home / Self Care | Payer: Medicare Other | Source: Ambulatory Visit | Attending: Radiation Oncology | Admitting: Radiation Oncology

## 2020-12-08 ENCOUNTER — Other Ambulatory Visit: Payer: Medicare Other

## 2020-12-08 ENCOUNTER — Inpatient Hospital Stay (HOSPITAL_BASED_OUTPATIENT_CLINIC_OR_DEPARTMENT_OTHER): Payer: Medicare Other | Admitting: Oncology

## 2020-12-08 DIAGNOSIS — I2510 Atherosclerotic heart disease of native coronary artery without angina pectoris: Secondary | ICD-10-CM | POA: Insufficient documentation

## 2020-12-08 DIAGNOSIS — Z7982 Long term (current) use of aspirin: Secondary | ICD-10-CM | POA: Insufficient documentation

## 2020-12-08 DIAGNOSIS — Z170 Estrogen receptor positive status [ER+]: Secondary | ICD-10-CM | POA: Insufficient documentation

## 2020-12-08 DIAGNOSIS — E785 Hyperlipidemia, unspecified: Secondary | ICD-10-CM | POA: Insufficient documentation

## 2020-12-08 DIAGNOSIS — Z510 Encounter for antineoplastic radiation therapy: Secondary | ICD-10-CM | POA: Diagnosis not present

## 2020-12-08 DIAGNOSIS — Z79899 Other long term (current) drug therapy: Secondary | ICD-10-CM | POA: Insufficient documentation

## 2020-12-08 DIAGNOSIS — E7800 Pure hypercholesterolemia, unspecified: Secondary | ICD-10-CM | POA: Insufficient documentation

## 2020-12-08 DIAGNOSIS — R911 Solitary pulmonary nodule: Secondary | ICD-10-CM | POA: Diagnosis not present

## 2020-12-08 DIAGNOSIS — I10 Essential (primary) hypertension: Secondary | ICD-10-CM | POA: Insufficient documentation

## 2020-12-08 DIAGNOSIS — C50212 Malignant neoplasm of upper-inner quadrant of left female breast: Secondary | ICD-10-CM | POA: Insufficient documentation

## 2020-12-08 DIAGNOSIS — Z87891 Personal history of nicotine dependence: Secondary | ICD-10-CM | POA: Insufficient documentation

## 2020-12-08 DIAGNOSIS — R918 Other nonspecific abnormal finding of lung field: Secondary | ICD-10-CM | POA: Insufficient documentation

## 2020-12-08 NOTE — Progress Notes (Signed)
Pulmonary Nodule Clinic Consult note [facility]  [phone] [phone]  [name], [name], [name], [name]   Name of the patient: Monique Hall  [id number]  1940/12/19   Date of visit: 12/08/2020   Diagnosis- Lung Nodule  Chief complaint/ Reason for visit- Pulmonary Nodule Clinic Follow-up Visit  I connected with Monique Hall on 12/11/20 at  3:00 PM EDT by telephone visit and verified that I am speaking with the correct person using two identifiers.   I discussed the limitations, risks, security and privacy concerns of performing an evaluation and management service by telemedicine and the availability of in-person appointments. I also discussed with the patient that there may be a patient responsible charge related to this service. The patient expressed understanding and agreed to proceed.   Other persons participating in the visit and their role in the encounter: None  Patient's location: Home  Provider's location: Clinic     Past Medical History:  Patient is managed/referred from LDCT screening program. She does not meet criteria for the LDCT screening program at this time.  Lung screening from 09/29/2017 revealed tiny bilateral pulmonary nodules measuring up to a maximum volume of 2.7 mm.  Continued annual screening recommended.  She was referred to the pulmonary nodule clinic and repeat Ct scan was ordered. She was evaluated after her scan on 11/24/2018 to discuss findings.  CT scan showed stable small subsolid pulmonary nodules with onew easuring up to 6 mm in size.  When compared to low-dose CT screening from 09/29/2017 there was an increase from 2.7 to 6 mm in LUL. Follow-up in 1 year recommended.    CT chest from 12/06/2019 reveals a stable 6 mm noncalcified  lung nodule within the left upper lobe.  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  Stable 3.6 cm thyroid nodule along the end inferior aspect of the right lobe.  Emphysema and aortic sclerosis.  Interval history-Monique Hall presents to the lung nodule clinic to discuss most recent CT chest without contrast.  Patient was referred by PCP initially to lung screening program by Dr. Army Melia.  Based on low-dose CT screening guidelines she has quit smoking greater than 15 years ago which makes her ineligible for continued LDCT screening.  Given she has persistent bilateral pulmonary nodules she meets criteria for the pulmonary nodule clinic.  Per medical record review, patient has past medical history of hypertension, GERD, hyperlipidemia, thyroid problem, otalgia, aortic arthrosclerosis and CAD.  She currently does not smoke cigarettes.  Quit date greater than 15 years ago.  She is retired.   She is widowed.  She does not have any children.   She denies any occupational exposure.   Familycancerhistory include: Mother has history of CAD and diabetes. Father has history of Parkinson's disease. No history of cancer per patient.  During the interim, patient has been doing well.  Had an abnormal mammogram that showed a 3 mm area of asymmetry which was confirmed by ultrasound.  Ultrasound-guided biopsy was positive for invasive mammary carcinoma.  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  She is currently undergoing whole breast radiation to her left breast.   Based on lung screening risk assessment, patient is atHIGH risk for the development of lung cancer.  ECOG FS:1 -  Symptomatic but completely ambulatory  Review of systems- Review of Systems  Constitutional: Negative.  Negative for chills, fever, malaise/fatigue and weight loss.  HENT: Negative for congestion, ear pain and tinnitus.   Eyes:  Negative.  Negative for blurred vision and double vision.  Respiratory: Negative.  Negative for cough, sputum production and shortness of breath.   Cardiovascular: Negative.  Negative for chest pain, palpitations and leg swelling.  Gastrointestinal: Negative.  Negative for abdominal pain, constipation, diarrhea, nausea and vomiting.  Genitourinary: Negative for dysuria, frequency and urgency.  Musculoskeletal: Positive for back pain. Negative for falls.  Skin: Negative.  Negative for rash.  Neurological: Negative.  Negative for weakness and headaches.  Endo/Heme/Allergies: Negative.  Does not bruise/bleed easily.  Psychiatric/Behavioral: Negative.  Negative for depression. The patient is not nervous/anxious and does not have insomnia.      Allergies  Allergen Reactions  . Baclofen     Other reaction(s): Unknown  . Etodolac Nausea Only     Past Medical History:  Diagnosis Date  . Allergy   . Arthritis   . 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 ([place]) 10/23/2020  . Emphysema lung ([place])   . Esophagitis   . Gastritis   . Gastroesophageal reflux disease without esophagitis   . History of kidney stones   . Hypercholesteremia   . Hypertension   . Lipoma of neck 04/27/2018  . Sciatica   . Tendonitis, [place] 2016   both   . Thyroid nodule 2019     Past Surgical History:  Procedure Laterality Date  . ABDOMINAL HYSTERECTOMY  1985   total for bleeding  . APPENDECTOMY    . BIOPSY THYROID  2019  . BLADDER SUSPENSION  2004  . BREAST BIOPSY Left 10/09/2020   [place] Bx, Q-clip, IMC  . BREAST LUMPECTOMY,RADIO FREQ LOCALIZER,AXILLARY SENTINEL LYMPH NODE BIOPSY Left 11/06/2020   Procedure: BREAST LUMPECTOMY,RADIO FREQ LOCALIZER,AXILLARY SENTINEL LYMPH NODE BIOPSY;  Surgeon: Benjamine Sprague, DO;  Location: [facility] ORS;  Service: General;  Laterality: Left;  . lipoma excision  03/2018   posterior neck    Social History   Socioeconomic History  . Marital status: Widowed     Spouse name: Not on file  . Number of children: 3  . Years of education: Not on file  . Highest education level: 12th grade  Occupational History  . Occupation: Retired  Tobacco Use  . Smoking status: Former Smoker    Packs/day: 1.00    Years: 35.00    Pack years: 35.00    Types: Cigarettes    Quit date: 09/01/2002    Years since quitting: 18.2  . Smokeless tobacco: Never Used  . Tobacco comment: smoking cessation materials not required  Vaping Use  . Vaping Use: Never used  Substance and Sexual Activity  . Alcohol use: Yes    Alcohol/week: 0.0 standard drinks    Comment: RARE  . Drug use: No  . Sexual activity: Not Currently  Other Topics Concern  . Not on file  Social History Narrative  . Not on file   Social Determinants of Health   Financial Resource Strain: Low Risk   . Difficulty of Paying Living Expenses: Not very hard  Food Insecurity: No Food Insecurity  . Worried About Charity fundraiser in the Last Year: Never true  . Ran Out of Food in the Last Year: Never true  Transportation Needs: No Transportation Needs  . Lack of Transportation (Medical): No  . Lack of Transportation (Non-Medical):  No  Physical Activity: Inactive  . Days of Exercise per Week: 0 days  . Minutes of Exercise per Session: 0 min  Stress: No Stress Concern Present  . Feeling of Stress : Not at all  Social Connections: Moderately Isolated  . Frequency of Communication with Friends and Family: More than three times a week  . Frequency of Social Gatherings with Friends and Family: More than three times a week  . Attends Religious Services: More than 4 times per year  . Active Member of Clubs or Organizations: No  . Attends Archivist Meetings: Never  . Marital Status: Widowed  Intimate Partner Violence: Not At Risk  . Fear of Current or Ex-Partner: No  . Emotionally Abused: No  . Physically Abused: No  . Sexually Abused: No    Family History  Problem Relation Age of Onset   . CAD Mother   . Diabetes Mother   . Arthritis Mother   . Hypertension Mother   . Parkinson's disease Father   . COPD Father   . Hearing loss Father   . Breast cancer Neg Hx      Current Outpatient Medications:  .  acetaminophen (TYLENOL) 500 MG tablet, Take 1 tablet (500 mg total) by mouth every 6 (six) hours as needed for mild pain or moderate pain. For shoulder pain, Disp: , Rfl:  .  aspirin EC 81 MG tablet, Take 81 mg by mouth daily., Disp: , Rfl:  .  Black Cohosh 200 MG CAPS, Take 200 mg by mouth daily. Reported on 08/20/2015, Disp: , Rfl:  .  Black Elderberry (SAMBUCUS ELDERBERRY PO), Take 1 each by mouth daily. gummy, Disp: , Rfl:  .  ezetimibe (ZETIA) 10 MG tablet, TAKE 1 TABLET EVERY DAY (Patient taking differently: Take 10 mg by mouth daily.), Disp: 90 tablet, Rfl: 0 .  HYDROcodone-acetaminophen (NORCO) 5-325 MG tablet, Take 1 tablet by mouth every 6 (six) hours as needed for up to 6 doses for moderate pain., Disp: 6 tablet, Rfl: 0 .  ibuprofen (ADVIL) 200 MG tablet, Take 400 mg by mouth every 8 (eight) hours as needed for moderate pain., Disp: , Rfl:  .  loratadine (CLARITIN) 10 MG tablet, Take 10 mg by mouth daily., Disp: , Rfl:  .  losartan-hydrochlorothiazide (HYZAAR) 100-12.5 MG tablet, TAKE 1 TABLET EVERY DAY (Patient taking differently: Take 1 tablet by mouth daily.), Disp: 90 tablet, Rfl: 0 .  triamcinolone (NASACORT) 55 MCG/ACT AERO nasal inhaler, Place 1 spray into the nose at bedtime as needed (allergies)., Disp: , Rfl:   Physical exam: There were no vitals filed for this visit.  Physical Exam  Not completed secondary to telephone visit    CMP Latest Ref Rng & Units 09/25/2020  Glucose 65 - 99 mg/dL 106(H)  BUN 8 - 27 mg/dL 19  Creatinine 0.57 - 1.00 mg/dL 1.12(H)  Sodium 134 - 144 mmol/L 139  Potassium 3.5 - 5.2 mmol/L 4.3  Chloride 96 - 106 mmol/L 101  CO2 20 - 29 mmol/L 21  Calcium 8.7 - 10.3 mg/dL 10.1  Total Protein 6.0 - 8.5 g/dL 7.1  Total Bilirubin  0.0 - 1.2 mg/dL 0.9  Alkaline Phos 44 - 121 IU/L 48  AST 0 - 40 IU/L 18  ALT 0 - 32 IU/L 18   CBC Latest Ref Rng & Units 09/25/2020  WBC 3.4 - 10.8 x10E3/uL 7.8  Hemoglobin 11.1 - 15.9 g/dL 12.4  Hematocrit 34.0 - 46.6 % 37.1  Platelets 150 - 450  x10E3/uL 414    No images are attached to the encounter.  CT Chest Wo Contrast  Result Date: 12/06/2020 CLINICAL DATA:  Follow-up pulmonary nodule EXAM: CT CHEST WITHOUT CONTRAST TECHNIQUE: Multidetector CT imaging of the chest was performed following the standard protocol without IV contrast. COMPARISON:  CT chest dated 12/06/2019 FINDINGS: Cardiovascular: Heart is normal in size.  No pericardial effusion. No evidence of thoracic aortic aneurysm. Atherosclerotic calcifications of the aortic arch. Coronary atherosclerosis the LAD and left coronary artery. Mediastinum/Nodes: Stable 3.6 cm right inferior thyroid nodule (series 2/image 19), previously characterized on thyroid ultrasound. This has been evaluated on previous imaging. (ref: J Am Coll Radiol. 2015 Feb;12(2): 143-50). No suspicious mediastinal lymphadenopathy. Lungs/Pleura: Biapical pleural-parenchymal scarring. Moderate centrilobular and paraseptal emphysematous changes, upper lung predominant. Evaluation of the lung parenchyma is constrained by respiratory motion. Within that constraint, there are multiple scattered subpleural nodules in the lungs bilaterally (for example, series 3/images 26, 28, 41, 58, and 106). Dominant 4 x 5 mm vague/ground-glass nodule in the lateral left lung apex (series 3/image 26) is unchanged, benign. 3 x 4 mm calcified granuloma in the superior segment right lower lobe (series 3/image 34), benign. No new/suspicious pulmonary nodules. No focal consolidation. No pleural effusion or pneumothorax. Upper Abdomen: Visualized upper abdomen is notable for mild hepatic steatosis and vascular calcifications. Musculoskeletal: Degenerative changes of the visualized thoracolumbar  spine. IMPRESSION: Stable bilateral pulmonary nodules, measuring up to 5 mm in the left lung apex, benign. Dedicated follow-up imaging is not required per Fleischner Society guidelines. Aortic Atherosclerosis (ICD10-I70.0) and Emphysema (ICD10-J43.9). Electronically Signed   By: Julian Hy M.D.   On: 12/06/2020 19:57    Assessment and plan- Patient is a 80 y.o. female who presents to pulmonary nodule clinic for assessment and review of recent CT scan.   A telephone call was conducted to review her CT scan.  Repeat CT scan from 12/05/2020 shows stable bilateral pulmonary nodules measuring up to 5 mm in the left lung apex which appear to be benign.  No additional follow-up needed.  Calculating malignancy probability of a pulmonary nodule: Risk factors include: 1. Age. 2. Cancer history. 3. Diameter of pulmonary nodule and mm 4. Location 5. Smoking history 6. Spiculation present  Based on risk factors, this patient is high risk for the development of lung cancer.    Given stability of her CT scan and no additional follow-up was recommended.  During our visit, we discussed pulmonary nodules are a common incidental finding and are often how lung cancer is discovered. Lung cancer survival is directly related to the stage at diagnosis. We discussed that nodules can vary in presentation from solitary pulmonary nodules to masses, 2 groundglass opacities and multiple nodules. Pulmonary nodules in the majority of cases are benign but the probability of these becoming malignant cannot be undermined. Early identification of malignant nodules could lead to early diagnosis and increased survival.  We discussed the probability of pulmonary nodules becoming malignant increase with age, pack years of tobacco use, size/characteristics of the nodule and location; with upper lobe involvement being most worrisome.  We discussed the goal of our clinic is to thoroughly evaluate each nodule, developed  a comprehensive, individualized plan of care utilizing the most advanced technology and significantly reduce the time from detection to treatment.A dedicated pulmonary nodule clinic has proven to indeed expedite the detection and treatment of lung cancer.  Patient education in fact sheet provided along with most recent CT scans.  Plan: Review most recent CT  chest.  She is currently undergoing XRT to the left breast for stage I breast cancer.  Disposition:  No follow-up needed.  In  Visit Diagnosis No diagnosis found. Patient expressed understanding and was in agreement with this plan. She also understands that She can call clinic at any time with any questions, concerns, or complaints.   I provided 12 minutes of non face-to-face telephone visit time during this encounter, and > 50% was spent counseling as documented under my assessment & plan.  Thank you for allowing me to participate in the care of this very pleasant patient.   Jacquelin Hawking, NP [place] [facility] Cell - [phone] Pager- [phone] 12/08/2020 4:28 PM CC: Dr. Army Melia

## 2020-12-09 ENCOUNTER — Ambulatory Visit
Admission: RE | Admit: 2020-12-09 | Discharge: 2020-12-09 | Disposition: A | Discharge status: Home / Self Care | Payer: Medicare Other | Source: Ambulatory Visit | Attending: Radiation Oncology | Admitting: Radiation Oncology

## 2020-12-09 DIAGNOSIS — E7800 Pure hypercholesterolemia, unspecified: Secondary | ICD-10-CM | POA: Diagnosis not present

## 2020-12-09 DIAGNOSIS — Z510 Encounter for antineoplastic radiation therapy: Secondary | ICD-10-CM | POA: Diagnosis not present

## 2020-12-09 DIAGNOSIS — E785 Hyperlipidemia, unspecified: Secondary | ICD-10-CM | POA: Diagnosis not present

## 2020-12-09 DIAGNOSIS — Z87891 Personal history of nicotine dependence: Secondary | ICD-10-CM | POA: Diagnosis not present

## 2020-12-09 DIAGNOSIS — Z170 Estrogen receptor positive status [ER+]: Secondary | ICD-10-CM | POA: Diagnosis not present

## 2020-12-09 DIAGNOSIS — C50212 Malignant neoplasm of upper-inner quadrant of left female breast: Secondary | ICD-10-CM | POA: Diagnosis not present

## 2020-12-10 ENCOUNTER — Ambulatory Visit
Admission: RE | Admit: 2020-12-10 | Discharge: 2020-12-10 | Disposition: A | Discharge status: Home / Self Care | Payer: Medicare Other | Source: Ambulatory Visit | Attending: Radiation Oncology | Admitting: Radiation Oncology

## 2020-12-10 ENCOUNTER — Inpatient Hospital Stay: Payer: Medicare Other

## 2020-12-10 DIAGNOSIS — Z510 Encounter for antineoplastic radiation therapy: Secondary | ICD-10-CM | POA: Diagnosis not present

## 2020-12-10 DIAGNOSIS — E785 Hyperlipidemia, unspecified: Secondary | ICD-10-CM | POA: Diagnosis not present

## 2020-12-10 DIAGNOSIS — C50212 Malignant neoplasm of upper-inner quadrant of left female breast: Secondary | ICD-10-CM | POA: Diagnosis not present

## 2020-12-10 DIAGNOSIS — Z170 Estrogen receptor positive status [ER+]: Secondary | ICD-10-CM

## 2020-12-10 DIAGNOSIS — Z87891 Personal history of nicotine dependence: Secondary | ICD-10-CM | POA: Diagnosis not present

## 2020-12-10 DIAGNOSIS — E7800 Pure hypercholesterolemia, unspecified: Secondary | ICD-10-CM | POA: Diagnosis not present

## 2020-12-10 LAB — CBC
HCT: 36 % (ref 36.0–46.0)
Hemoglobin: 11.8 g/dL — ABNORMAL LOW (ref 12.0–15.0)
MCH: 30.6 pg (ref 26.0–34.0)
MCHC: 32.8 g/dL (ref 30.0–36.0)
MCV: 93.3 fL (ref 80.0–100.0)
Platelets: 321 10*3/uL (ref 150–400)
RBC: 3.86 MIL/uL — ABNORMAL LOW (ref 3.87–5.11)
RDW: 13.3 % (ref 11.5–15.5)
WBC: 6.5 10*3/uL (ref 4.0–10.5)
nRBC: 0 % (ref 0.0–0.2)

## 2020-12-11 ENCOUNTER — Other Ambulatory Visit: Payer: Self-pay

## 2020-12-11 ENCOUNTER — Ambulatory Visit
Admission: RE | Admit: 2020-12-11 | Discharge: 2020-12-11 | Disposition: A | Discharge status: Home / Self Care | Payer: Medicare Other | Source: Ambulatory Visit | Attending: Otolaryngology | Admitting: Otolaryngology

## 2020-12-11 ENCOUNTER — Ambulatory Visit
Admission: RE | Admit: 2020-12-11 | Discharge: 2020-12-11 | Disposition: A | Discharge status: Home / Self Care | Payer: Medicare Other | Source: Ambulatory Visit | Attending: Radiation Oncology | Admitting: Radiation Oncology

## 2020-12-11 DIAGNOSIS — E7800 Pure hypercholesterolemia, unspecified: Secondary | ICD-10-CM | POA: Diagnosis not present

## 2020-12-11 DIAGNOSIS — E041 Nontoxic single thyroid nodule: Secondary | ICD-10-CM | POA: Insufficient documentation

## 2020-12-11 DIAGNOSIS — E042 Nontoxic multinodular goiter: Secondary | ICD-10-CM | POA: Diagnosis not present

## 2020-12-11 DIAGNOSIS — Z170 Estrogen receptor positive status [ER+]: Secondary | ICD-10-CM | POA: Diagnosis not present

## 2020-12-11 DIAGNOSIS — Z87891 Personal history of nicotine dependence: Secondary | ICD-10-CM | POA: Diagnosis not present

## 2020-12-11 DIAGNOSIS — Z510 Encounter for antineoplastic radiation therapy: Secondary | ICD-10-CM | POA: Diagnosis not present

## 2020-12-11 DIAGNOSIS — E785 Hyperlipidemia, unspecified: Secondary | ICD-10-CM | POA: Diagnosis not present

## 2020-12-11 DIAGNOSIS — C50212 Malignant neoplasm of upper-inner quadrant of left female breast: Secondary | ICD-10-CM | POA: Diagnosis not present

## 2020-12-12 ENCOUNTER — Ambulatory Visit
Admission: RE | Admit: 2020-12-12 | Discharge: 2020-12-12 | Disposition: A | Discharge status: Home / Self Care | Payer: Medicare Other | Source: Ambulatory Visit | Attending: Radiation Oncology | Admitting: Radiation Oncology

## 2020-12-12 DIAGNOSIS — Z87891 Personal history of nicotine dependence: Secondary | ICD-10-CM | POA: Diagnosis not present

## 2020-12-12 DIAGNOSIS — Z170 Estrogen receptor positive status [ER+]: Secondary | ICD-10-CM | POA: Diagnosis not present

## 2020-12-12 DIAGNOSIS — E785 Hyperlipidemia, unspecified: Secondary | ICD-10-CM | POA: Diagnosis not present

## 2020-12-12 DIAGNOSIS — Z510 Encounter for antineoplastic radiation therapy: Secondary | ICD-10-CM | POA: Diagnosis not present

## 2020-12-12 DIAGNOSIS — C50212 Malignant neoplasm of upper-inner quadrant of left female breast: Secondary | ICD-10-CM | POA: Diagnosis not present

## 2020-12-12 DIAGNOSIS — E7800 Pure hypercholesterolemia, unspecified: Secondary | ICD-10-CM | POA: Diagnosis not present

## 2020-12-16 ENCOUNTER — Ambulatory Visit
Admission: RE | Admit: 2020-12-16 | Discharge: 2020-12-16 | Disposition: A | Discharge status: Home / Self Care | Payer: Medicare Other | Source: Ambulatory Visit | Attending: Radiation Oncology | Admitting: Radiation Oncology

## 2020-12-16 DIAGNOSIS — E785 Hyperlipidemia, unspecified: Secondary | ICD-10-CM | POA: Diagnosis not present

## 2020-12-16 DIAGNOSIS — Z510 Encounter for antineoplastic radiation therapy: Secondary | ICD-10-CM | POA: Diagnosis not present

## 2020-12-16 DIAGNOSIS — E7800 Pure hypercholesterolemia, unspecified: Secondary | ICD-10-CM | POA: Diagnosis not present

## 2020-12-16 DIAGNOSIS — Z87891 Personal history of nicotine dependence: Secondary | ICD-10-CM | POA: Diagnosis not present

## 2020-12-16 DIAGNOSIS — Z170 Estrogen receptor positive status [ER+]: Secondary | ICD-10-CM | POA: Diagnosis not present

## 2020-12-16 DIAGNOSIS — C50212 Malignant neoplasm of upper-inner quadrant of left female breast: Secondary | ICD-10-CM | POA: Diagnosis not present

## 2020-12-17 ENCOUNTER — Ambulatory Visit
Admission: RE | Admit: 2020-12-17 | Discharge: 2020-12-17 | Disposition: A | Discharge status: Home / Self Care | Payer: Medicare Other | Source: Ambulatory Visit | Attending: Radiation Oncology | Admitting: Radiation Oncology

## 2020-12-17 DIAGNOSIS — C50212 Malignant neoplasm of upper-inner quadrant of left female breast: Secondary | ICD-10-CM | POA: Diagnosis not present

## 2020-12-17 DIAGNOSIS — Z7982 Long term (current) use of aspirin: Secondary | ICD-10-CM | POA: Diagnosis not present

## 2020-12-17 DIAGNOSIS — I10 Essential (primary) hypertension: Secondary | ICD-10-CM | POA: Diagnosis not present

## 2020-12-17 DIAGNOSIS — E042 Nontoxic multinodular goiter: Secondary | ICD-10-CM | POA: Diagnosis not present

## 2020-12-17 DIAGNOSIS — Z79899 Other long term (current) drug therapy: Secondary | ICD-10-CM | POA: Diagnosis not present

## 2020-12-17 DIAGNOSIS — I2510 Atherosclerotic heart disease of native coronary artery without angina pectoris: Secondary | ICD-10-CM | POA: Diagnosis not present

## 2020-12-17 DIAGNOSIS — Z510 Encounter for antineoplastic radiation therapy: Secondary | ICD-10-CM | POA: Diagnosis not present

## 2020-12-17 DIAGNOSIS — Z170 Estrogen receptor positive status [ER+]: Secondary | ICD-10-CM | POA: Diagnosis not present

## 2020-12-17 DIAGNOSIS — Z87891 Personal history of nicotine dependence: Secondary | ICD-10-CM | POA: Diagnosis not present

## 2020-12-17 DIAGNOSIS — E7800 Pure hypercholesterolemia, unspecified: Secondary | ICD-10-CM | POA: Diagnosis not present

## 2020-12-17 DIAGNOSIS — E785 Hyperlipidemia, unspecified: Secondary | ICD-10-CM | POA: Diagnosis not present

## 2020-12-18 ENCOUNTER — Ambulatory Visit
Admission: RE | Admit: 2020-12-18 | Discharge: 2020-12-18 | Disposition: A | Discharge status: Home / Self Care | Payer: Medicare Other | Source: Ambulatory Visit | Attending: Radiation Oncology | Admitting: Radiation Oncology

## 2020-12-18 DIAGNOSIS — I2510 Atherosclerotic heart disease of native coronary artery without angina pectoris: Secondary | ICD-10-CM | POA: Insufficient documentation

## 2020-12-18 DIAGNOSIS — Z510 Encounter for antineoplastic radiation therapy: Secondary | ICD-10-CM | POA: Insufficient documentation

## 2020-12-18 DIAGNOSIS — E7800 Pure hypercholesterolemia, unspecified: Secondary | ICD-10-CM | POA: Insufficient documentation

## 2020-12-18 DIAGNOSIS — E042 Nontoxic multinodular goiter: Secondary | ICD-10-CM | POA: Insufficient documentation

## 2020-12-18 DIAGNOSIS — Z7982 Long term (current) use of aspirin: Secondary | ICD-10-CM | POA: Insufficient documentation

## 2020-12-18 DIAGNOSIS — I10 Essential (primary) hypertension: Secondary | ICD-10-CM | POA: Insufficient documentation

## 2020-12-18 DIAGNOSIS — E785 Hyperlipidemia, unspecified: Secondary | ICD-10-CM | POA: Diagnosis not present

## 2020-12-18 DIAGNOSIS — C50212 Malignant neoplasm of upper-inner quadrant of left female breast: Secondary | ICD-10-CM | POA: Insufficient documentation

## 2020-12-18 DIAGNOSIS — Z87891 Personal history of nicotine dependence: Secondary | ICD-10-CM | POA: Insufficient documentation

## 2020-12-18 DIAGNOSIS — Z79899 Other long term (current) drug therapy: Secondary | ICD-10-CM | POA: Insufficient documentation

## 2020-12-18 DIAGNOSIS — Z170 Estrogen receptor positive status [ER+]: Secondary | ICD-10-CM | POA: Diagnosis not present

## 2020-12-19 ENCOUNTER — Ambulatory Visit
Admission: RE | Admit: 2020-12-19 | Discharge: 2020-12-19 | Disposition: A | Discharge status: Home / Self Care | Payer: Medicare Other | Source: Ambulatory Visit | Attending: Radiation Oncology | Admitting: Radiation Oncology

## 2020-12-19 DIAGNOSIS — E042 Nontoxic multinodular goiter: Secondary | ICD-10-CM | POA: Diagnosis not present

## 2020-12-19 DIAGNOSIS — Z510 Encounter for antineoplastic radiation therapy: Secondary | ICD-10-CM | POA: Diagnosis not present

## 2020-12-19 DIAGNOSIS — I2510 Atherosclerotic heart disease of native coronary artery without angina pectoris: Secondary | ICD-10-CM | POA: Diagnosis not present

## 2020-12-19 DIAGNOSIS — C50212 Malignant neoplasm of upper-inner quadrant of left female breast: Secondary | ICD-10-CM | POA: Diagnosis not present

## 2020-12-19 DIAGNOSIS — Z170 Estrogen receptor positive status [ER+]: Secondary | ICD-10-CM | POA: Diagnosis not present

## 2020-12-19 DIAGNOSIS — E785 Hyperlipidemia, unspecified: Secondary | ICD-10-CM | POA: Diagnosis not present

## 2020-12-19 NOTE — Progress Notes (Signed)
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  INTERVAL HISTORY: Patient returns to clinic today near the end of her XRT for further evaluation and initiation of aromatase inhibitor.  She tolerated her adjuvant radiation treatments well only with some mild skin irritation.  She otherwise feels well and is asymptomatic. She has no neurologic complaints.  She denies any recent fevers or illnesses.  She has a good appetite and denies weight loss.  She has no chest pain, shortness of breath, cough, or hemoptysis.  She denies any nausea, vomiting, constipation, or diarrhea.  She has no urinary complaints.  Patient offers no further specific complaints today.  REVIEW OF SYSTEMS:   Review of Systems  Constitutional: Negative.  Negative for fever, malaise/fatigue and weight loss.  Respiratory: Negative.  Negative for cough, hemoptysis and shortness of breath.   Cardiovascular: Negative.  Negative for chest pain and leg swelling.  Gastrointestinal: Negative.  Negative for abdominal pain.  Genitourinary: Negative.  Negative for dysuria.  Musculoskeletal: Negative.  Negative for back pain.  Skin: Negative.  Negative for rash.  Neurological: Negative.  Negative for dizziness, focal weakness, weakness and headaches.  Psychiatric/Behavioral: Negative.  The patient is not nervous/anxious.    As per HPI. Otherwise, a complete review of systems is negative.  PAST MEDICAL HISTORY: Past Medical History:  Diagnosis Date   Allergy    Arthritis    Carcinoma of upper-inner  quadrant of left female breast ([place]) 10/23/2020   Emphysema lung (HCC)    Esophagitis    Gastritis    Gastroesophageal reflux disease without esophagitis    History of kidney stones    Hypercholesteremia    Hypertension    Lipoma of neck 04/27/2018   Sciatica    Tendonitis, Achilles 2016   both    Thyroid nodule 2019    PAST SURGICAL HISTORY: Past Surgical History:  Procedure Laterality Date   ABDOMINAL HYSTERECTOMY  1985   total for bleeding   APPENDECTOMY     BIOPSY THYROID  2019   BLADDER SUSPENSION  2004   BREAST BIOPSY Left 10/09/2020   [place] Bx, Q-clip, IMC   BREAST LUMPECTOMY,RADIO FREQ LOCALIZER,AXILLARY SENTINEL LYMPH NODE BIOPSY Left 11/06/2020   Procedure: BREAST LUMPECTOMY,RADIO FREQ LOCALIZER,AXILLARY SENTINEL LYMPH NODE BIOPSY;  Surgeon: Benjamine Sprague, DO;  Location: [facility] ORS;  Service: General;  Laterality: Left;   lipoma excision  03/2018   posterior neck    FAMILY HISTORY: Family History  Problem Relation Age of Onset   CAD Mother    Diabetes Mother    Arthritis Mother    Hypertension Mother    Parkinson's disease Father    COPD Father    Hearing loss Father    Breast cancer Neg Hx     ADVANCED DIRECTIVES (Y/N):  N  HEALTH MAINTENANCE: Social History   Tobacco Use   Smoking status: Former    Packs/day: 1.00    Years: 35.00    Pack years: 35.00    Types: Cigarettes    Quit date: 09/01/2002    Years since quitting: 18.3   Smokeless tobacco: Never   Tobacco comments:  smoking cessation materials not required  Vaping Use   Vaping Use: Never used  Substance Use Topics   Alcohol use: Yes    Alcohol/week: 0.0 standard drinks    Comment: RARE   Drug use: No     Colonoscopy:  PAP:  Bone density:  Lipid panel:  Allergies  Allergen Reactions   Baclofen     Other reaction(s): Unknown   Etodolac Nausea Only    Current Outpatient Medications  Medication Sig Dispense Refill   acetaminophen (TYLENOL) 500 MG tablet Take 1 tablet (500 mg  total) by mouth every 6 (six) hours as needed for mild pain or moderate pain. For shoulder pain     aspirin EC 81 MG tablet Take 81 mg by mouth daily.     Black Elderberry (SAMBUCUS ELDERBERRY PO) Take 1 each by mouth daily. gummy     ezetimibe (ZETIA) 10 MG tablet TAKE 1 TABLET EVERY DAY (Patient taking differently: Take 10 mg by mouth daily.) 90 tablet 0   ibuprofen (ADVIL) 200 MG tablet Take 400 mg by mouth every 8 (eight) hours as needed for moderate pain.     letrozole (FEMARA) 2.5 MG tablet Take 1 tablet (2.5 mg total) by mouth daily. 90 tablet 3   loratadine (CLARITIN) 10 MG tablet Take 10 mg by mouth daily.     losartan-hydrochlorothiazide (HYZAAR) 100-12.5 MG tablet TAKE 1 TABLET EVERY DAY (Patient taking differently: Take 1 tablet by mouth daily.) 90 tablet 0   triamcinolone (NASACORT) 55 MCG/ACT AERO nasal inhaler Place 1 spray into the nose at bedtime as needed (allergies).     No current facility-administered medications for this visit.    OBJECTIVE: Vitals:   12/25/20 1314  BP: 133/71  Pulse: 81  Resp: 20  Temp: 98.2 F (36.8 C)  SpO2: 98%     Body mass index is 29.07 kg/m.    ECOG FS:0 - Asymptomatic  General: Well-developed, well-nourished, no acute distress. Eyes: Pink conjunctiva, anicteric sclera. HEENT: Normocephalic, moist mucous membranes. Breast: Mild erythema of left breast. Lungs: No audible wheezing or coughing. Heart: Regular rate and rhythm. Abdomen: Soft, nontender, no obvious distention. Musculoskeletal: No edema, cyanosis, or clubbing. Neuro: Alert, answering all questions appropriately. Cranial nerves grossly intact. Skin: No rashes or petechiae noted. Psych: Normal affect.    LAB RESULTS:  Lab Results  Component Value Date   NA 139 09/25/2020   K 4.3 09/25/2020   CL 101 09/25/2020   CO2 21 09/25/2020   GLUCOSE 106 (H) 09/25/2020   BUN 19 09/25/2020   CREATININE 1.12 (H) 09/25/2020   CALCIUM 10.1 09/25/2020   PROT 7.1 09/25/2020    ALBUMIN 4.4 09/25/2020   AST 18 09/25/2020   ALT 18 09/25/2020   ALKPHOS 48 09/25/2020   BILITOT 0.9 09/25/2020   GFRNONAA 55 (L) 09/20/2019   GFRAA 63 09/20/2019    Lab Results  Component Value Date   WBC 6.5 12/10/2020   NEUTROABS 3.9 09/25/2020   HGB 11.8 (L) 12/10/2020   HCT 36.0 12/10/2020   MCV 93.3 12/10/2020   PLT 321 12/10/2020     STUDIES: CT Chest Wo Contrast  Result Date: 12/06/2020 CLINICAL DATA:  Follow-up pulmonary nodule EXAM: CT CHEST WITHOUT CONTRAST TECHNIQUE: Multidetector CT imaging of the chest was performed following the standard protocol without IV contrast. COMPARISON:  CT chest dated 12/06/2019 FINDINGS: Cardiovascular: Heart is normal in size.  No pericardial effusion. No evidence of thoracic aortic aneurysm. Atherosclerotic calcifications of the aortic arch. Coronary atherosclerosis  the LAD and left coronary artery. Mediastinum/Nodes: Stable 3.6 cm right inferior thyroid nodule (series 2/image 19), previously characterized on thyroid ultrasound. This has been evaluated on previous imaging. (ref: J Am Coll Radiol. 2015 Feb;12(2): 143-50). No suspicious mediastinal lymphadenopathy. Lungs/Pleura: Biapical pleural-parenchymal scarring. Moderate centrilobular and paraseptal emphysematous changes, upper lung predominant. Evaluation of the lung parenchyma is constrained by respiratory motion. Within that constraint, there are multiple scattered subpleural nodules in the lungs bilaterally (for example, series 3/images 26, 28, 41, 58, and 106). Dominant 4 x 5 mm vague/ground-glass nodule in the lateral left lung apex (series 3/image 26) is unchanged, benign. 3 x 4 mm calcified granuloma in the superior segment right lower lobe (series 3/image 34), benign. No new/suspicious pulmonary nodules. No focal consolidation. No pleural effusion or pneumothorax. Upper Abdomen: Visualized upper abdomen is notable for mild hepatic steatosis and vascular calcifications. Musculoskeletal:  Degenerative changes of the visualized thoracolumbar spine. IMPRESSION: Stable bilateral pulmonary nodules, measuring up to 5 mm in the left lung apex, benign. Dedicated follow-up imaging is not required per Fleischner Society guidelines. Aortic Atherosclerosis (ICD10-I70.0) and Emphysema (ICD10-J43.9). Electronically Signed   By: Julian Hy M.D.   On: 12/06/2020 19:57   US THYROID  Result Date: 12/11/2020 CLINICAL DATA:  Goiter. Multinodular thyroid gland. Patient previously underwent biopsy of the right-sided thyroid nodule in April 2019. EXAM: THYROID ULTRASOUND TECHNIQUE: Ultrasound examination of the thyroid gland and adjacent soft tissues was performed. COMPARISON:  Most recent prior thyroid ultrasound 11/24/2018 FINDINGS: Parenchymal Echotexture: Moderately heterogenous Isthmus: 0.2 cm Right lobe: 5.9 x 3.3 x 2.9 cm Left lobe: 4.0 x 1.7 x 1.6 cm _________________________________________________________ Estimated total number of nodules >/= 1 cm: 3 Number of spongiform nodules >/=  2 cm not described below (TR1): 0 Number of mixed cystic and solid nodules >/= 1.5 cm not described below (TR2): 0 _________________________________________________________ Nodule # 1: No significant interval change in the size or appearance of the previously biopsied mass occupying the majority of the right gland. The structure measures approximately 4.6 x 3.0 x 2.7 cm, unchanged compared to 4.7 x 3 x 3 cm previously. Multiple small echogenic and spongiform nodules in the left mid to lower gland. Additionally, there is a small anechoic cyst in the lower pole. None of these lesions meets criteria to recommend biopsy or further imaging evaluation. No further follow-up required. IMPRESSION: 1. Continued stability of previously biopsied right-sided thyroid nodule. Presuming previously benign biopsy results, no further follow-up is required. 2. No new or suspicious thyroid nodules identified. The above is in keeping with the  ACR TI-RADS recommendations - J Am Coll Radiol 2017;14:587-595. Electronically Signed   By: Jacqulynn Cadet M.D.   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  PLAN:    1.  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  Patient did not require adjuvant chemotherapy.  She will complete adjuvant XRT on December 26, 2020.  Patient was given a prescription for letrozole today.  She will require treatment for total 5 years completing in June 2027.  No further intervention is needed.  Return to clinic in 3 months for routine evaluation. 2.  Bone health: Will get baseline bone mineral density in the next 1 to 2 weeks.  I spent a total of 20 minutes  reviewing chart data, face-to-face evaluation with the patient, counseling and coordination of care as detailed above.   Patient expressed understanding and was in agreement with this plan. She also understands that She can call clinic at any time with any questions, concerns, or complaints.   Cancer Staging 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 [facility]) Staging form: Breast, AJCC 8th Edition - Pathologic stage from 11/13/2020: Stage IA (pT1a, pN0, cM0, G1, ER+, PR+, HER2-) - Signed by Lloyd Huger, MD on 11/13/2020 Stage prefix: Initial diagnosis Histologic grading system: 3 grade system   Lloyd Huger, MD   12/26/2020 9:33 PM

## 2020-12-22 ENCOUNTER — Ambulatory Visit
Admission: RE | Admit: 2020-12-22 | Discharge: 2020-12-22 | Disposition: A | Discharge status: Home / Self Care | Payer: Medicare Other | Source: Ambulatory Visit | Attending: Radiation Oncology | Admitting: Radiation Oncology

## 2020-12-22 DIAGNOSIS — I2510 Atherosclerotic heart disease of native coronary artery without angina pectoris: Secondary | ICD-10-CM | POA: Diagnosis not present

## 2020-12-22 DIAGNOSIS — E785 Hyperlipidemia, unspecified: Secondary | ICD-10-CM | POA: Diagnosis not present

## 2020-12-22 DIAGNOSIS — C50212 Malignant neoplasm of upper-inner quadrant of left female breast: Secondary | ICD-10-CM | POA: Diagnosis not present

## 2020-12-22 DIAGNOSIS — E042 Nontoxic multinodular goiter: Secondary | ICD-10-CM | POA: Diagnosis not present

## 2020-12-22 DIAGNOSIS — Z170 Estrogen receptor positive status [ER+]: Secondary | ICD-10-CM | POA: Diagnosis not present

## 2020-12-22 DIAGNOSIS — Z510 Encounter for antineoplastic radiation therapy: Secondary | ICD-10-CM | POA: Diagnosis not present

## 2020-12-23 ENCOUNTER — Ambulatory Visit
Admission: RE | Admit: 2020-12-23 | Discharge: 2020-12-23 | Disposition: A | Discharge status: Home / Self Care | Payer: Medicare Other | Source: Ambulatory Visit | Attending: Radiation Oncology | Admitting: Radiation Oncology

## 2020-12-23 DIAGNOSIS — Z510 Encounter for antineoplastic radiation therapy: Secondary | ICD-10-CM | POA: Diagnosis not present

## 2020-12-23 DIAGNOSIS — Z170 Estrogen receptor positive status [ER+]: Secondary | ICD-10-CM | POA: Diagnosis not present

## 2020-12-23 DIAGNOSIS — E042 Nontoxic multinodular goiter: Secondary | ICD-10-CM | POA: Diagnosis not present

## 2020-12-23 DIAGNOSIS — C50212 Malignant neoplasm of upper-inner quadrant of left female breast: Secondary | ICD-10-CM | POA: Diagnosis not present

## 2020-12-23 DIAGNOSIS — E785 Hyperlipidemia, unspecified: Secondary | ICD-10-CM | POA: Diagnosis not present

## 2020-12-23 DIAGNOSIS — I2510 Atherosclerotic heart disease of native coronary artery without angina pectoris: Secondary | ICD-10-CM | POA: Diagnosis not present

## 2020-12-24 ENCOUNTER — Ambulatory Visit
Admission: RE | Admit: 2020-12-24 | Discharge: 2020-12-24 | Disposition: A | Discharge status: Home / Self Care | Payer: Medicare Other | Source: Ambulatory Visit | Attending: Radiation Oncology | Admitting: Radiation Oncology

## 2020-12-24 DIAGNOSIS — Z170 Estrogen receptor positive status [ER+]: Secondary | ICD-10-CM | POA: Diagnosis not present

## 2020-12-24 DIAGNOSIS — I2510 Atherosclerotic heart disease of native coronary artery without angina pectoris: Secondary | ICD-10-CM | POA: Diagnosis not present

## 2020-12-24 DIAGNOSIS — E785 Hyperlipidemia, unspecified: Secondary | ICD-10-CM | POA: Diagnosis not present

## 2020-12-24 DIAGNOSIS — E042 Nontoxic multinodular goiter: Secondary | ICD-10-CM | POA: Diagnosis not present

## 2020-12-24 DIAGNOSIS — C50212 Malignant neoplasm of upper-inner quadrant of left female breast: Secondary | ICD-10-CM | POA: Diagnosis not present

## 2020-12-24 DIAGNOSIS — Z510 Encounter for antineoplastic radiation therapy: Secondary | ICD-10-CM | POA: Diagnosis not present

## 2020-12-25 ENCOUNTER — Ambulatory Visit
Admission: RE | Admit: 2020-12-25 | Discharge: 2020-12-25 | Disposition: A | Discharge status: Home / Self Care | Payer: Medicare Other | Source: Ambulatory Visit | Attending: Radiation Oncology | Admitting: Radiation Oncology

## 2020-12-25 ENCOUNTER — Inpatient Hospital Stay: Payer: Medicare Other | Attending: Oncology | Admitting: Oncology

## 2020-12-25 ENCOUNTER — Encounter: Payer: Self-pay | Admitting: Oncology

## 2020-12-25 VITALS — BP 133/71 | HR 81 | Temp 98.2°F | Resp 20 | Wt 174.7 lb

## 2020-12-25 DIAGNOSIS — Z780 Asymptomatic menopausal state: Secondary | ICD-10-CM | POA: Diagnosis not present

## 2020-12-25 DIAGNOSIS — Z510 Encounter for antineoplastic radiation therapy: Secondary | ICD-10-CM | POA: Diagnosis not present

## 2020-12-25 DIAGNOSIS — C50212 Malignant neoplasm of upper-inner quadrant of left female breast: Secondary | ICD-10-CM | POA: Diagnosis not present

## 2020-12-25 DIAGNOSIS — E042 Nontoxic multinodular goiter: Secondary | ICD-10-CM | POA: Diagnosis not present

## 2020-12-25 DIAGNOSIS — E785 Hyperlipidemia, unspecified: Secondary | ICD-10-CM | POA: Diagnosis not present

## 2020-12-25 DIAGNOSIS — Z170 Estrogen receptor positive status [ER+]: Secondary | ICD-10-CM

## 2020-12-25 DIAGNOSIS — I2510 Atherosclerotic heart disease of native coronary artery without angina pectoris: Secondary | ICD-10-CM | POA: Diagnosis not present

## 2020-12-25 MED ORDER — LETROZOLE 2.5 MG PO TABS: 2.5000 mg | ORAL_TABLET | Freq: Every day | ORAL | 3 refills | Status: DC

## 2020-12-26 ENCOUNTER — Ambulatory Visit
Admission: RE | Admit: 2020-12-26 | Discharge: 2020-12-26 | Disposition: A | Discharge status: Home / Self Care | Payer: Medicare Other | Source: Ambulatory Visit | Attending: Radiation Oncology | Admitting: Radiation Oncology

## 2020-12-26 DIAGNOSIS — I2510 Atherosclerotic heart disease of native coronary artery without angina pectoris: Secondary | ICD-10-CM | POA: Diagnosis not present

## 2020-12-26 DIAGNOSIS — E785 Hyperlipidemia, unspecified: Secondary | ICD-10-CM | POA: Diagnosis not present

## 2020-12-26 DIAGNOSIS — Z170 Estrogen receptor positive status [ER+]: Secondary | ICD-10-CM | POA: Diagnosis not present

## 2020-12-26 DIAGNOSIS — E042 Nontoxic multinodular goiter: Secondary | ICD-10-CM | POA: Diagnosis not present

## 2020-12-26 DIAGNOSIS — C50212 Malignant neoplasm of upper-inner quadrant of left female breast: Secondary | ICD-10-CM | POA: Diagnosis not present

## 2020-12-26 DIAGNOSIS — Z510 Encounter for antineoplastic radiation therapy: Secondary | ICD-10-CM | POA: Diagnosis not present

## 2021-01-12 ENCOUNTER — Other Ambulatory Visit: Payer: Self-pay

## 2021-01-12 ENCOUNTER — Ambulatory Visit
Admission: RE | Admit: 2021-01-12 | Discharge: 2021-01-12 | Disposition: A | Discharge status: Home / Self Care | Payer: Medicare Other | Source: Ambulatory Visit | Attending: Oncology | Admitting: Oncology

## 2021-01-12 DIAGNOSIS — Z780 Asymptomatic menopausal state: Secondary | ICD-10-CM | POA: Diagnosis not present

## 2021-01-12 DIAGNOSIS — M85852 Other specified disorders of bone density and structure, left thigh: Secondary | ICD-10-CM | POA: Diagnosis not present

## 2021-01-12 HISTORY — DX: Personal history of irradiation: Z92.3

## 2021-01-15 ENCOUNTER — Other Ambulatory Visit: Payer: Self-pay | Admitting: Internal Medicine

## 2021-01-15 DIAGNOSIS — E782 Mixed hyperlipidemia: Secondary | ICD-10-CM

## 2021-01-15 DIAGNOSIS — I10 Essential (primary) hypertension: Secondary | ICD-10-CM

## 2021-01-26 ENCOUNTER — Encounter: Payer: Self-pay | Admitting: Radiation Oncology

## 2021-01-26 ENCOUNTER — Ambulatory Visit
Admission: RE | Admit: 2021-01-26 | Discharge: 2021-01-26 | Disposition: A | Discharge status: Home / Self Care | Payer: Medicare Other | Source: Ambulatory Visit | Attending: Radiation Oncology | Admitting: Radiation Oncology

## 2021-01-26 VITALS — BP 132/78 | HR 100 | Temp 98.0°F | Resp 18 | Wt 174.0 lb

## 2021-01-26 DIAGNOSIS — Z170 Estrogen receptor positive status [ER+]: Secondary | ICD-10-CM

## 2021-01-26 DIAGNOSIS — Z923 Personal history of irradiation: Secondary | ICD-10-CM | POA: Insufficient documentation

## 2021-01-26 DIAGNOSIS — Z79811 Long term (current) use of aromatase inhibitors: Secondary | ICD-10-CM | POA: Insufficient documentation

## 2021-01-26 DIAGNOSIS — C50212 Malignant neoplasm of upper-inner quadrant of left female breast: Secondary | ICD-10-CM | POA: Insufficient documentation

## 2021-01-26 NOTE — Progress Notes (Signed)
Radiation Oncology Follow up Note  Name: Monique Hall   Date:   01/26/2021 MRN:  [id number] DOB: [birth year]    This 80 y.o. female presents to the clinic today for 1 month follow-up status post status post whole breast radiation to her left breast for stage Ia ER/PR positive invasive mammary carcinoma.  REFERRING PROVIDER: Glean Hess, MD  HPI: Patient is a 80 year old female now about 1 month having completed whole breast radiation to her left breast for stage Ia ER/PR positive invasive mammary carcinoma seen today in routine follow-up she is doing well.  She specifically denies breast tenderness cough or bone pain still has some numbness in her left axilla.  Still some slight hyperpigmentation of the skin..  She is started letrozole tolerating it well without side effect.  COMPLICATIONS OF TREATMENT: none  FOLLOW UP COMPLIANCE: keeps appointments   PHYSICAL EXAM:  BP 132/78   Pulse 100   Temp 98 F (36.7 C)   Resp 18   Wt 174 lb (78.9 kg)   BMI 28.96 kg/m  Lungs are clear to A&P cardiac examination essentially unremarkable with regular rate and rhythm. No dominant mass or nodularity is noted in either breast in 2 positions examined. Incision is well-healed. No axillary or supraclavicular adenopathy is appreciated. Cosmetic result is excellent.  Well-developed well-nourished patient in NAD. HEENT reveals PERLA, EOMI, discs not visualized.  Oral cavity is clear. No oral mucosal lesions are identified. Neck is clear without evidence of cervical or supraclavicular adenopathy. Lungs are clear to A&P. Cardiac examination is essentially unremarkable with regular rate and rhythm without murmur rub or thrill. Abdomen is benign with no organomegaly or masses noted. Motor sensory and DTR levels are equal and symmetric in the upper and lower extremities. Cranial nerves II through XII are grossly intact. Proprioception is intact. No peripheral adenopathy or edema is identified. No motor or  sensory levels are noted. Crude visual fields are within normal range.  RADIOLOGY RESULTS: No current films for review  PLAN: Present time patient is doing well 1 month out.  I have asked to see her back in 4 to 5 months for follow-up.  She continues on letrozole without side effect.  Patient knows to call with any concerns.  I would like to take this opportunity to thank you for allowing me to participate in the care of your patient.Noreene Filbert, MD

## 2021-02-05 DIAGNOSIS — H2513 Age-related nuclear cataract, bilateral: Secondary | ICD-10-CM | POA: Diagnosis not present

## 2021-02-05 DIAGNOSIS — H401431 Capsular glaucoma with pseudoexfoliation of lens, bilateral, mild stage: Secondary | ICD-10-CM | POA: Diagnosis not present

## 2021-02-25 ENCOUNTER — Encounter: Payer: Self-pay | Admitting: Family Medicine

## 2021-02-25 ENCOUNTER — Other Ambulatory Visit: Payer: Self-pay

## 2021-02-25 ENCOUNTER — Telehealth: Payer: Self-pay

## 2021-02-25 ENCOUNTER — Ambulatory Visit (INDEPENDENT_AMBULATORY_CARE_PROVIDER_SITE_OTHER): Payer: Medicare Other | Admitting: Family Medicine

## 2021-02-25 VITALS — BP 150/70 | HR 86 | Temp 98.0°F | Ht 65.0 in | Wt 175.0 lb

## 2021-02-25 DIAGNOSIS — I25118 Atherosclerotic heart disease of native coronary artery with other forms of angina pectoris: Secondary | ICD-10-CM

## 2021-02-25 DIAGNOSIS — J0100 Acute maxillary sinusitis, unspecified: Secondary | ICD-10-CM

## 2021-02-25 DIAGNOSIS — J432 Centrilobular emphysema: Secondary | ICD-10-CM | POA: Diagnosis not present

## 2021-02-25 MED ORDER — AZITHROMYCIN 250 MG PO TABS: ORAL_TABLET | ORAL | 0 refills | Status: AC

## 2021-02-25 NOTE — Telephone Encounter (Signed)
Copied from [place] [id number]. Topic: General - Other >> Feb 25, 2021  9:37 AM Tessa Lerner A wrote: Reason for CRM: Patient would like to be prescribed something to help with discomfort for a cough  Patient shares that they have experienced the cough for a little less than a week and see phlegm and mucus buildup in their cough   The patient would like to be prescribed something to help with discomfort  The patient declined to schedule an appt at the time of call with agent  Please contact further

## 2021-02-25 NOTE — Progress Notes (Signed)
Date:  02/25/2021   Name:  Monique Hall   DOB:  01/05/1941   MRN:  [id number]   Chief Complaint: Cough (Started last week with sore throat- it is gone, but cough is lingering. Yellow/ green phlegm. Took 2 COVID tests, yesterday being the most recent- both were negative)  Cough This is a new problem. The current episode started in the past 7 days. The problem has been unchanged. The problem occurs every few hours. The cough is Productive of purulent sputum. Associated symptoms include shortness of breath. Pertinent negatives include no chest pain, chills, ear congestion, ear pain, fever, headaches, heartburn, hemoptysis, myalgias, nasal congestion, postnasal drip, sore throat, sweats or wheezing. Associated symptoms comments: Shortness of breath only when "coughing real hard". Nothing aggravates the symptoms. She has tried OTC cough suppressant for the symptoms. The treatment provided no relief.  Sinusitis This is a new problem. The current episode started in the past 7 days. The problem is unchanged. There has been no fever. Associated symptoms include congestion, coughing, shortness of breath, sinus pressure and sneezing. Pertinent negatives include no chills, ear pain, headaches or sore throat. The treatment provided moderate relief.   Lab Results  Component Value Date   CREATININE 1.12 (H) 09/25/2020   BUN 19 09/25/2020   NA 139 09/25/2020   K 4.3 09/25/2020   CL 101 09/25/2020   CO2 21 09/25/2020   Lab Results  Component Value Date   CHOL 265 (H) 09/25/2020   HDL 52 09/25/2020   LDLCALC 167 (H) 09/25/2020   TRIG 249 (H) 09/25/2020   CHOLHDL 5.1 (H) 09/25/2020   Lab Results  Component Value Date   TSH 2.820 09/25/2020   No results found for: HGBA1C Lab Results  Component Value Date   WBC 6.5 12/10/2020   HGB 11.8 (L) 12/10/2020   HCT 36.0 12/10/2020   MCV 93.3 12/10/2020   PLT 321 12/10/2020   Lab Results  Component Value Date   ALT 18 09/25/2020   AST 18 09/25/2020    ALKPHOS 48 09/25/2020   BILITOT 0.9 09/25/2020     Review of Systems  Constitutional:  Negative for chills and fever.  HENT:  Positive for congestion, sinus pressure and sneezing. Negative for ear pain, postnasal drip and sore throat.   Respiratory:  Positive for cough and shortness of breath. Negative for hemoptysis and wheezing.   Cardiovascular:  Negative for chest pain.  Gastrointestinal:  Negative for heartburn.  Musculoskeletal:  Negative for myalgias.  Neurological:  Negative for headaches.   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 ([place]) 10/23/2020   Shoulder pain, bilateral 01/24/2020   Aortic atherosclerosis ([place]) 09/15/2018   Centrilobular emphysema ([place]) 09/15/2018   Atherosclerotic heart disease of native coronary artery with other forms of angina pectoris ([place]) 09/15/2018   Thyroid nodule 10/06/2017   Gastroesophageal reflux disease without esophagitis 09/02/2016   Essential hypertension 07/29/2015   Mixed hyperlipidemia 07/29/2015   Menopausal symptoms 07/29/2015   Tobacco use disorder, moderate, in sustained remission 07/29/2015   Degeneration of intervertebral disc of lumbar region 06/17/2015   Neuritis 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 02/05/2015    Allergies  Allergen Reactions   Baclofen     Other reaction(s): Unknown   Etodolac Nausea Only    Past Surgical History:  Procedure Laterality Date   ABDOMINAL HYSTERECTOMY  1985   total for bleeding   APPENDECTOMY  BIOPSY THYROID  2019   BLADDER SUSPENSION  2004   BREAST BIOPSY Left 10/09/2020   [place] Bx, Q-clip, IMC   BREAST LUMPECTOMY,RADIO FREQ LOCALIZER,AXILLARY SENTINEL LYMPH NODE BIOPSY Left 11/06/2020   Procedure: BREAST LUMPECTOMY,RADIO FREQ LOCALIZER,AXILLARY SENTINEL LYMPH NODE BIOPSY;  Surgeon: Benjamine Sprague, DO;  Location: [facility] ORS;  Service: General;  Laterality: Left;   lipoma excision  03/2018    posterior neck    Social History   Tobacco Use   Smoking status: Former    Packs/day: 1.00    Years: 35.00    Pack years: 35.00    Types: Cigarettes    Quit date: 09/01/2002    Years since quitting: 18.4   Smokeless tobacco: Never   Tobacco comments:    smoking cessation materials not required  Vaping Use   Vaping Use: Never used  Substance Use Topics   Alcohol use: Yes    Alcohol/week: 0.0 standard drinks    Comment: RARE   Drug use: No     Medication list has been reviewed and updated.  Current Meds  Medication Sig   acetaminophen (TYLENOL) 500 MG tablet Take 1 tablet (500 mg total) by mouth every 6 (six) hours as needed for mild pain or moderate pain. For shoulder pain   aspirin EC 81 MG tablet Take 81 mg by mouth daily.   Black Elderberry (SAMBUCUS ELDERBERRY PO) Take 1 each by mouth daily. gummy   ezetimibe (ZETIA) 10 MG tablet TAKE 1 TABLET EVERY DAY   ibuprofen (ADVIL) 200 MG tablet Take 400 mg by mouth every 8 (eight) hours as needed for moderate pain.   letrozole (FEMARA) 2.5 MG tablet Take 1 tablet (2.5 mg total) by mouth daily.   loratadine (CLARITIN) 10 MG tablet Take 10 mg by mouth daily.   losartan-hydrochlorothiazide (HYZAAR) 100-12.5 MG tablet TAKE 1 TABLET EVERY DAY   triamcinolone (NASACORT) 55 MCG/ACT AERO nasal inhaler Place 1 spray into the nose at bedtime as needed (allergies).    PHQ 2/9 Scores 09/25/2020 09/24/2020 09/20/2019 09/17/2019  PHQ - 2 Score 0 0 0 0  PHQ- 9 Score 0 - - -    GAD 7 : Generalized Anxiety Score 09/25/2020  Nervous, Anxious, on Edge 0  Control/stop worrying 0  Worry too much - different things 0  Trouble relaxing 0  Restless 0  Easily annoyed or irritable 0  Afraid - awful might happen 0  Total GAD 7 Score 0    BP Readings from Last 3 Encounters:  02/25/21 (!) 150/70  01/26/21 132/78  12/25/20 133/71    Physical Exam Vitals and nursing note reviewed.  Constitutional:      General: She is not in acute distress.     Appearance: She is not diaphoretic.  HENT:     Head: Normocephalic and atraumatic.     Jaw: There is normal jaw occlusion.     Right Ear: Tympanic membrane and external ear normal.     Left Ear: Tympanic membrane and external ear normal.     Nose: Nose normal.     Right Turbinates: Not enlarged.     Left Turbinates: Not enlarged.     Right Sinus: No maxillary sinus tenderness.     Left Sinus: No maxillary sinus tenderness.  Eyes:     General:        Right eye: No discharge.        Left eye: No discharge.     Conjunctiva/sclera: Conjunctivae normal.     Pupils:  Pupils are equal, round, and reactive to light.  Neck:     Thyroid: No thyromegaly.     Vascular: No JVD.  Cardiovascular:     Rate and Rhythm: Normal rate and regular rhythm.     Heart sounds: Normal heart sounds. No murmur heard.   No friction rub. No gallop.  Pulmonary:     Effort: Pulmonary effort is normal.     Breath sounds: Wheezing present. No rhonchi or rales.  Abdominal:     General: Bowel sounds are normal.     Palpations: Abdomen is soft. There is no mass.     Tenderness: There is no abdominal tenderness. There is no guarding.  Musculoskeletal:        General: Normal range of motion.     Cervical back: Normal range of motion and neck supple.  Lymphadenopathy:     Cervical: No cervical adenopathy.  Skin:    General: Skin is warm and dry.  Neurological:     Mental Status: She is alert.     Deep Tendon Reflexes: Reflexes are normal and symmetric.    Wt Readings from Last 3 Encounters:  02/25/21 175 lb (79.4 kg)  01/26/21 174 lb (78.9 kg)  12/25/20 174 lb 11.2 oz (79.2 kg)    BP (!) 150/70   Pulse 86   Temp 98 F (36.7 C) (Oral)   Ht '5\' 5"'$  (1.651 m)   Wt 175 lb (79.4 kg)   SpO2 96%   BMI 29.12 kg/m   Assessment and Plan:  1. Acute maxillary sinusitis, recurrence not specified New onset.  Persistent.  Stable.  Patient has a productive nasal discharge and cough consistent with sinus infection.   We will treat with azithromycin to 50 mg 2 today followed by 1 a day for 4 days. - azithromycin (ZITHROMAX) 250 MG tablet; Take 2 tablets on day 1, then 1 tablet daily on days 2 through 5  Dispense: 6 tablet; Refill: 0  2. Centrilobular emphysema (HCC) Chronic.  Controlled.  Stable.  CT scan that is following and nodule in May noted centrilobular and paraseptal emphysema.  Patient has diffuse wheezes but no rales rhonchi's or rubs noted.  Patient has been instructed to pick up some Mucinex DM to induce sputum and phlegm production.  At this point in time patient is not in need bronchodilation.

## 2021-02-25 NOTE — Telephone Encounter (Signed)
Called pt scheduled an appt for today 02/25/2021.  KP

## 2021-03-28 NOTE — Progress Notes (Signed)
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  INTERVAL HISTORY: Patient returns to clinic today for routine 3 month evaluation and to assess her toleration of letrozole.  She currently feels well and is asymptomatic.  She is tolerating letrozole without significant side effects. She has no neurologic complaints.  She denies any recent fevers or illnesses.  She has a good appetite and denies weight loss.  She has no chest pain, shortness of breath, cough, or hemoptysis.  She denies any nausea, vomiting, constipation, or diarrhea.  She has no urinary complaints.  Patient offers no specific complaints today.  REVIEW OF SYSTEMS:   Review of Systems  Constitutional: Negative.  Negative for fever, malaise/fatigue and weight loss.  Respiratory: Negative.  Negative for cough, hemoptysis and shortness of breath.   Cardiovascular: Negative.  Negative for chest pain and leg swelling.  Gastrointestinal: Negative.  Negative for abdominal pain.  Genitourinary: Negative.  Negative for dysuria.  Musculoskeletal: Negative.  Negative for back pain.  Skin: Negative.  Negative for rash.  Neurological: Negative.  Negative for dizziness, focal weakness, weakness and headaches.  Psychiatric/Behavioral: Negative.  The patient is not nervous/anxious.    As per HPI. Otherwise, a complete review of systems is negative.  PAST MEDICAL HISTORY: Past Medical History:  Diagnosis Date   Allergy    Arthritis    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 ([place]) 10/23/2020    Emphysema lung ([facility])    Esophagitis    Gastritis    Gastroesophageal reflux disease without esophagitis    History of kidney stones    Hypercholesteremia    Hypertension    Lipoma of neck 04/27/2018   Personal history of radiation therapy    Sciatica    Tendonitis, Achilles 2016   both    Thyroid nodule 2019    PAST SURGICAL HISTORY: Past Surgical History:  Procedure Laterality Date   ABDOMINAL HYSTERECTOMY  1985   total for bleeding   APPENDECTOMY     BIOPSY THYROID  2019   BLADDER SUSPENSION  2004   BREAST BIOPSY Left 10/09/2020   [place] Bx, Q-clip, IMC   BREAST LUMPECTOMY,RADIO FREQ LOCALIZER,AXILLARY SENTINEL LYMPH NODE BIOPSY Left 11/06/2020   Procedure: BREAST LUMPECTOMY,RADIO FREQ LOCALIZER,AXILLARY SENTINEL LYMPH NODE BIOPSY;  Surgeon: Benjamine Sprague, DO;  Location: [facility] ORS;  Service: General;  Laterality: Left;   lipoma excision  03/2018   posterior neck    FAMILY HISTORY: Family History  Problem Relation Age of Onset   CAD Mother    Diabetes Mother    Arthritis Mother    Hypertension Mother    Parkinson's disease Father    COPD Father    Hearing loss Father    Breast cancer Neg Hx     ADVANCED DIRECTIVES (Y/N):  N  HEALTH MAINTENANCE: Social History   Tobacco Use   Smoking status: Former    Packs/day: 1.00    Years: 35.00    Pack years: 35.00    Types: Cigarettes    Quit date: 09/01/2002    Years since quitting: 18.5   Smokeless tobacco: Never   Tobacco comments:  smoking cessation materials not required  Vaping Use   Vaping Use: Never used  Substance Use Topics   Alcohol use: Yes    Alcohol/week: 0.0 standard drinks    Comment: RARE   Drug use: No     Colonoscopy:  PAP:  Bone density:  Lipid panel:  Allergies  Allergen Reactions   Baclofen     Other reaction(s): Unknown   Etodolac Nausea Only    Current Outpatient Medications  Medication Sig Dispense Refill   acetaminophen (TYLENOL) 500 MG tablet Take 1 tablet (500 mg total)  by mouth every 6 (six) hours as needed for mild pain or moderate pain. For shoulder pain     aspirin EC 81 MG tablet Take 81 mg by mouth daily.     Black Elderberry (SAMBUCUS ELDERBERRY PO) Take 1 each by mouth daily. gummy     ezetimibe (ZETIA) 10 MG tablet TAKE 1 TABLET EVERY DAY 90 tablet 0   ibuprofen (ADVIL) 200 MG tablet Take 400 mg by mouth every 8 (eight) hours as needed for moderate pain.     letrozole (FEMARA) 2.5 MG tablet Take 1 tablet (2.5 mg total) by mouth daily. 90 tablet 3   loratadine (CLARITIN) 10 MG tablet Take 10 mg by mouth daily.     losartan-hydrochlorothiazide (HYZAAR) 100-12.5 MG tablet TAKE 1 TABLET EVERY DAY 90 tablet 0   triamcinolone (NASACORT) 55 MCG/ACT AERO nasal inhaler Place 1 spray into the nose at bedtime as needed (allergies).     No current facility-administered medications for this visit.    OBJECTIVE: Vitals:   03/31/21 1352  BP: (!) 153/81  Pulse: 83  Resp: 16  Temp: 98.8 F (37.1 C)     Body mass index is 28.54 kg/m.    ECOG FS:0 - Asymptomatic  General: Well-developed, well-nourished, no acute distress. Eyes: Pink conjunctiva, anicteric sclera. HEENT: Normocephalic, moist mucous membranes. Lungs: No audible wheezing or coughing. Heart: Regular rate and rhythm. Abdomen: Soft, nontender, no obvious distention. Musculoskeletal: No edema, cyanosis, or clubbing. Neuro: Alert, answering all questions appropriately. Cranial nerves grossly intact. Skin: No rashes or petechiae noted. Psych: Normal affect.  LAB RESULTS:  Lab Results  Component Value Date   NA 139 09/25/2020   K 4.3 09/25/2020   CL 101 09/25/2020   CO2 21 09/25/2020   GLUCOSE 106 (H) 09/25/2020   BUN 19 09/25/2020   CREATININE 1.12 (H) 09/25/2020   CALCIUM 10.1 09/25/2020   PROT 7.1 09/25/2020   ALBUMIN 4.4 09/25/2020   AST 18 09/25/2020   ALT 18 09/25/2020   ALKPHOS 48 09/25/2020   BILITOT 0.9 09/25/2020   GFRNONAA 55 (L) 09/20/2019   GFRAA 63 09/20/2019     Lab Results  Component Value Date   WBC 6.5 12/10/2020   NEUTROABS 3.9 09/25/2020   HGB 11.8 (L) 12/10/2020   HCT 36.0 12/10/2020   MCV 93.3 12/10/2020   PLT 321 12/10/2020     STUDIES: No results found.   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  PLAN:    1.  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  She completed adjuvant XRT on December 26, 2020.  Continue letrozole for a total of 5 years completing in June 2027.  No further intervention is needed.  Return to clinic in 6 months with a video assist telemedicine visit. 2.  Osteopenia:  Patient had a baseline bone mineral density on January 12, 2021 which reported a T-score of -2.2.  Recommended Calcium and vitamin D supplement. Repeat in June 2023. 3. Hypertension: Blood pressure moderately elevated today. Continue evaluation and treatment per primary care.    Patient expressed understanding and was in agreement with this plan. She also understands that She can call clinic at any time with any questions, concerns, or complaints.   Cancer Staging 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 [facility]) Staging form: Breast, AJCC 8th Edition - Pathologic stage from 11/13/2020: Stage IA (pT1a, pN0, cM0, G1, ER+, PR+, HER2-) - Signed by Lloyd Huger, MD on 11/13/2020 Stage prefix: Initial diagnosis Histologic grading system: 3 grade system   Lloyd Huger, MD   03/31/2021 6:58 PM

## 2021-03-31 ENCOUNTER — Inpatient Hospital Stay: Payer: Medicare Other | Attending: Oncology | Admitting: Oncology

## 2021-03-31 ENCOUNTER — Encounter: Payer: Self-pay | Admitting: Oncology

## 2021-03-31 VITALS — BP 153/81 | HR 83 | Temp 98.8°F | Resp 16 | Wt 171.5 lb

## 2021-03-31 DIAGNOSIS — I10 Essential (primary) hypertension: Secondary | ICD-10-CM | POA: Diagnosis not present

## 2021-03-31 DIAGNOSIS — Z87891 Personal history of nicotine dependence: Secondary | ICD-10-CM | POA: Insufficient documentation

## 2021-03-31 DIAGNOSIS — Z79899 Other long term (current) drug therapy: Secondary | ICD-10-CM | POA: Insufficient documentation

## 2021-03-31 DIAGNOSIS — Z7982 Long term (current) use of aspirin: Secondary | ICD-10-CM | POA: Diagnosis not present

## 2021-03-31 DIAGNOSIS — M8580 Other specified disorders of bone density and structure, unspecified site: Secondary | ICD-10-CM | POA: Diagnosis not present

## 2021-03-31 DIAGNOSIS — Z170 Estrogen receptor positive status [ER+]: Secondary | ICD-10-CM | POA: Diagnosis not present

## 2021-03-31 DIAGNOSIS — Z923 Personal history of irradiation: Secondary | ICD-10-CM | POA: Insufficient documentation

## 2021-03-31 DIAGNOSIS — C50212 Malignant neoplasm of upper-inner quadrant of left female breast: Secondary | ICD-10-CM | POA: Insufficient documentation

## 2021-03-31 DIAGNOSIS — Z79811 Long term (current) use of aromatase inhibitors: Secondary | ICD-10-CM | POA: Diagnosis not present

## 2021-03-31 NOTE — Progress Notes (Signed)
Patient denies new problems/concerns today.   °

## 2021-04-02 ENCOUNTER — Ambulatory Visit (INDEPENDENT_AMBULATORY_CARE_PROVIDER_SITE_OTHER): Payer: Medicare Other | Admitting: Internal Medicine

## 2021-04-02 ENCOUNTER — Encounter: Payer: Self-pay | Admitting: Internal Medicine

## 2021-04-02 ENCOUNTER — Other Ambulatory Visit: Payer: Self-pay

## 2021-04-02 DIAGNOSIS — I10 Essential (primary) hypertension: Secondary | ICD-10-CM

## 2021-04-02 DIAGNOSIS — I25118 Atherosclerotic heart disease of native coronary artery with other forms of angina pectoris: Secondary | ICD-10-CM | POA: Diagnosis not present

## 2021-04-02 DIAGNOSIS — M25511 Pain in right shoulder: Secondary | ICD-10-CM | POA: Diagnosis not present

## 2021-04-02 DIAGNOSIS — E782 Mixed hyperlipidemia: Secondary | ICD-10-CM | POA: Diagnosis not present

## 2021-04-02 MED ORDER — LOSARTAN POTASSIUM-HCTZ 100-12.5 MG PO TABS: 1.0000 | ORAL_TABLET | Freq: Every day | ORAL | 1 refills | Status: DC

## 2021-04-02 MED ORDER — EZETIMIBE 10 MG PO TABS: 10.0000 mg | ORAL_TABLET | Freq: Every day | ORAL | 1 refills | Status: DC

## 2021-04-02 NOTE — Progress Notes (Signed)
Date:  04/02/2021   Name:  Monique Hall   DOB:  [birth year]   MRN:  [id number]   Chief Complaint: Hypertension and Flu Vaccine  Hypertension This is a chronic problem. The problem is controlled. Pertinent negatives include no chest pain, headaches, palpitations or shortness of breath. There are no associated agents to hypertension. Past treatments include diuretics and angiotensin blockers. The current treatment provides significant improvement. There are no compliance problems.  Hypertensive end-organ damage includes kidney disease (mild renal insufficiency). There is no history of CAD/MI or CVA.   Lab Results  Component Value Date   CREATININE 1.12 (H) 09/25/2020   BUN 19 09/25/2020   NA 139 09/25/2020   K 4.3 09/25/2020   CL 101 09/25/2020   CO2 21 09/25/2020   Lab Results  Component Value Date   CHOL 265 (H) 09/25/2020   HDL 52 09/25/2020   LDLCALC 167 (H) 09/25/2020   TRIG 249 (H) 09/25/2020   CHOLHDL 5.1 (H) 09/25/2020   Lab Results  Component Value Date   TSH 2.820 09/25/2020   No results found for: HGBA1C Lab Results  Component Value Date   WBC 6.5 12/10/2020   HGB 11.8 (L) 12/10/2020   HCT 36.0 12/10/2020   MCV 93.3 12/10/2020   PLT 321 12/10/2020   Lab Results  Component Value Date   ALT 18 09/25/2020   AST 18 09/25/2020   ALKPHOS 48 09/25/2020   BILITOT 0.9 09/25/2020     Review of Systems  Constitutional:  Negative for fatigue and unexpected weight change.  HENT:  Negative for nosebleeds.   Eyes:  Negative for visual disturbance.  Respiratory:  Negative for cough, chest tightness, shortness of breath and wheezing.   Cardiovascular:  Negative for chest pain, palpitations and leg swelling.  Gastrointestinal:  Negative for abdominal pain, constipation and diarrhea.  Musculoskeletal:  Positive for arthralgias (bilateral shoulder pain - right worse recently).  Neurological:  Negative for dizziness, weakness, light-headedness and headaches.   Patient  Active Problem List   Diagnosis Date Noted   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 ([place]) 10/23/2020   Shoulder pain, bilateral 01/24/2020   Aortic atherosclerosis ([place]) 09/15/2018   Centrilobular emphysema ([place]) 09/15/2018   Atherosclerotic heart disease of native coronary artery with other forms of angina pectoris ([place]) 09/15/2018   Thyroid nodule 10/06/2017   Gastroesophageal reflux disease without esophagitis 09/02/2016   Essential hypertension 07/29/2015   Mixed hyperlipidemia 07/29/2015   Menopausal symptoms 07/29/2015   Tobacco use disorder, moderate, in sustained remission 07/29/2015   Degeneration of intervertebral disc of lumbar region 06/17/2015   Neuritis 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 02/05/2015    Allergies  Allergen Reactions   Baclofen     Other reaction(s): Unknown   Etodolac Nausea Only    Past Surgical History:  Procedure Laterality Date   ABDOMINAL HYSTERECTOMY  1985   total for bleeding   APPENDECTOMY     BIOPSY THYROID  2019   BLADDER SUSPENSION  2004   BREAST BIOPSY Left 10/09/2020   [place] Bx, Q-clip, IMC   BREAST LUMPECTOMY,RADIO FREQ LOCALIZER,AXILLARY SENTINEL LYMPH NODE BIOPSY Left 11/06/2020   Procedure: BREAST LUMPECTOMY,RADIO FREQ LOCALIZER,AXILLARY SENTINEL LYMPH NODE BIOPSY;  Surgeon: Benjamine Sprague, DO;  Location: [facility] ORS;  Service: General;  Laterality: Left;   lipoma excision  03/2018   posterior neck    Social History   Tobacco Use   Smoking status: Former    Packs/day: 1.00  Years: 35.00    Pack years: 35.00    Types: Cigarettes    Quit date: 09/01/2002    Years since quitting: 18.5   Smokeless tobacco: Never   Tobacco comments:    smoking cessation materials not required  Vaping Use   Vaping Use: Never used  Substance Use Topics   Alcohol use: Yes    Alcohol/week: 0.0 standard drinks    Comment: RARE   Drug use: No     Medication list has been reviewed and updated.  Current  Meds  Medication Sig   acetaminophen (TYLENOL) 500 MG tablet Take 1 tablet (500 mg total) by mouth every 6 (six) hours as needed for mild pain or moderate pain. For shoulder pain   aspirin EC 81 MG tablet Take 81 mg by mouth daily.   Black Elderberry (SAMBUCUS ELDERBERRY PO) Take 1 each by mouth daily. gummy   calcium-vitamin D (OSCAL WITH D) 500-200 MG-UNIT tablet Take 1 tablet by mouth daily.   ibuprofen (ADVIL) 200 MG tablet Take 400 mg by mouth every 8 (eight) hours as needed for moderate pain.   letrozole (FEMARA) 2.5 MG tablet Take 1 tablet (2.5 mg total) by mouth daily.   loratadine (CLARITIN) 10 MG tablet Take 10 mg by mouth daily.   triamcinolone (NASACORT) 55 MCG/ACT AERO nasal inhaler Place 1 spray into the nose at bedtime as needed (allergies).   [DISCONTINUED] ezetimibe (ZETIA) 10 MG tablet TAKE 1 TABLET EVERY DAY   [DISCONTINUED] losartan-hydrochlorothiazide (HYZAAR) 100-12.5 MG tablet TAKE 1 TABLET EVERY DAY    PHQ 2/9 Scores 04/02/2021 09/25/2020 09/24/2020 09/20/2019  PHQ - 2 Score 0 0 0 0  PHQ- 9 Score 1 0 - -    GAD 7 : Generalized Anxiety Score 04/02/2021 09/25/2020  Nervous, Anxious, on Edge 0 0  Control/stop worrying 0 0  Worry too much - different things 0 0  Trouble relaxing 0 0  Restless 0 0  Easily annoyed or irritable 0 0  Afraid - awful might happen 0 0  Total GAD 7 Score 0 0    BP Readings from Last 3 Encounters:  04/02/21 136/74  03/31/21 (!) 153/81  02/25/21 (!) 150/70    Physical Exam Vitals and nursing note reviewed.  Constitutional:      General: She is not in acute distress.    Appearance: She is well-developed.  HENT:     Head: Normocephalic and atraumatic.  Cardiovascular:     Rate and Rhythm: Normal rate and regular rhythm.     Pulses: Normal pulses.     Heart sounds: No murmur heard. Pulmonary:     Effort: Pulmonary effort is normal. No respiratory distress.     Breath sounds: No wheezing or rhonchi.  Musculoskeletal:     Cervical  back: Normal range of motion.     Right lower leg: No edema.     Left lower leg: No edema.  Lymphadenopathy:     Cervical: No cervical adenopathy.  Skin:    General: Skin is warm and dry.     Findings: No rash.  Neurological:     Mental Status: She is alert and oriented to person, place, and time.  Psychiatric:        Mood and Affect: Mood normal.        Behavior: Behavior normal.    Wt Readings from Last 3 Encounters:  04/02/21 171 lb (77.6 kg)  03/31/21 171 lb 8 oz (77.8 kg)  02/25/21 175 lb (79.4 kg)  BP 136/74   Pulse 77   Temp 98 F (36.7 C) (Oral)   Ht '5\' 5"'$  (1.651 m)   Wt 171 lb (77.6 kg)   SpO2 96%   BMI 28.46 kg/m   Assessment and Plan: 1. Essential hypertension Clinically stable exam with well controlled BP. Tolerating medications without side effects at this time. Pt to continue current regimen and low sodium diet; benefits of regular exercise as able discussed. - losartan-hydrochlorothiazide (HYZAAR) 100-12.5 MG tablet; Take 1 tablet by mouth daily.  Dispense: 90 tablet; Refill: 1  2. Mixed hyperlipidemia Intolerant of multiple statins. Taking Zetia with some improvement in lipids without side effects - ezetimibe (ZETIA) 10 MG tablet; Take 1 tablet (10 mg total) by mouth daily.  Dispense: 90 tablet; Refill: 1   Partially dictated using Editor, commissioning. Any errors are unintentional.  Halina Maidens, MD [facility]  04/02/2021

## 2021-04-21 ENCOUNTER — Ambulatory Visit (INDEPENDENT_AMBULATORY_CARE_PROVIDER_SITE_OTHER): Payer: Medicare Other

## 2021-04-21 ENCOUNTER — Other Ambulatory Visit: Payer: Self-pay

## 2021-04-21 DIAGNOSIS — Z23 Encounter for immunization: Secondary | ICD-10-CM | POA: Diagnosis not present

## 2021-06-16 DIAGNOSIS — M5416 Radiculopathy, lumbar region: Secondary | ICD-10-CM | POA: Diagnosis not present

## 2021-06-16 DIAGNOSIS — M5136 Other intervertebral disc degeneration, lumbar region: Secondary | ICD-10-CM | POA: Diagnosis not present

## 2021-07-02 ENCOUNTER — Encounter: Payer: Self-pay | Admitting: Radiation Oncology

## 2021-07-02 ENCOUNTER — Ambulatory Visit
Admission: RE | Admit: 2021-07-02 | Discharge: 2021-07-02 | Disposition: A | Discharge status: Home / Self Care | Payer: Medicare Other | Source: Ambulatory Visit | Attending: Radiation Oncology | Admitting: Radiation Oncology

## 2021-07-02 ENCOUNTER — Other Ambulatory Visit: Payer: Self-pay

## 2021-07-02 VITALS — BP 160/79 | HR 88 | Temp 98.0°F | Wt 171.2 lb

## 2021-07-02 DIAGNOSIS — Z79811 Long term (current) use of aromatase inhibitors: Secondary | ICD-10-CM | POA: Insufficient documentation

## 2021-07-02 DIAGNOSIS — Z170 Estrogen receptor positive status [ER+]: Secondary | ICD-10-CM

## 2021-07-02 DIAGNOSIS — Z08 Encounter for follow-up examination after completed treatment for malignant neoplasm: Secondary | ICD-10-CM | POA: Diagnosis not present

## 2021-07-02 DIAGNOSIS — Z923 Personal history of irradiation: Secondary | ICD-10-CM | POA: Diagnosis not present

## 2021-07-02 DIAGNOSIS — C50212 Malignant neoplasm of upper-inner quadrant of left female breast: Secondary | ICD-10-CM | POA: Insufficient documentation

## 2021-07-02 DIAGNOSIS — M5416 Radiculopathy, lumbar region: Secondary | ICD-10-CM | POA: Diagnosis not present

## 2021-07-02 DIAGNOSIS — R918 Other nonspecific abnormal finding of lung field: Secondary | ICD-10-CM | POA: Insufficient documentation

## 2021-07-02 DIAGNOSIS — M48062 Spinal stenosis, lumbar region with neurogenic claudication: Secondary | ICD-10-CM | POA: Diagnosis not present

## 2021-07-02 DIAGNOSIS — R200 Anesthesia of skin: Secondary | ICD-10-CM | POA: Insufficient documentation

## 2021-07-02 NOTE — Progress Notes (Signed)
Survivorship Care Plan visit completed.  Treatment summary reviewed and given to patient.  ASCO answers booklet reviewed and given to patient.  CARE program and Cancer Transitions discussed with patient along with other resources cancer center offers to patients and caregivers.  Patient verbalized understanding.    

## 2021-07-02 NOTE — Progress Notes (Signed)
Radiation Oncology Follow up Note  Name: Monique Hall   Date:   07/02/2021 MRN:  [id number] DOB: [birth year]    This 80 y.o. female presents to the clinic today for 40-month follow-up status post whole breast radiation to her left breast for stage Ia ER/PR positive invasive mammary carcinoma.  REFERRING PROVIDER: Glean Hess, MD  HPI: Patient is a 80 year old female now out 6 months having completed whole breast radiation to her left breast for stage Ia ER/PR positive invasive mammary carcinoma.  Seen today in routine follow-up she is doing well.  She specifically denies breast tenderness cough or bone pain.  She continues to have some numbness of the breast which have assured her is not unusual..  She had a CT scan which I have reviewed of her chest back in May for stable bilateral pulmonary nodules not needing follow-up all measuring up to 5 mm.  She is currently on letrozole tolerating that well without side effect.  COMPLICATIONS OF TREATMENT: none  FOLLOW UP COMPLIANCE: keeps appointments   PHYSICAL EXAM:  BP (!) 160/79    Pulse 88    Temp 98 F (36.7 C) (Tympanic)    Wt 171 lb 3.2 oz (77.7 kg)    BMI 28.49 kg/m  Lungs are clear to A&P cardiac examination essentially unremarkable with regular rate and rhythm. No dominant mass or nodularity is noted in either breast in 2 positions examined. Incision is well-healed. No axillary or supraclavicular adenopathy is appreciated. Cosmetic result is excellent.  Well-developed well-nourished patient in NAD. HEENT reveals PERLA, EOMI, discs not visualized.  Oral cavity is clear. No oral mucosal lesions are identified. Neck is clear without evidence of cervical or supraclavicular adenopathy. Lungs are clear to A&P. Cardiac examination is essentially unremarkable with regular rate and rhythm without murmur rub or thrill. Abdomen is benign with no organomegaly or masses noted. Motor sensory and DTR levels are equal and symmetric in the upper and lower  extremities. Cranial nerves II through XII are grossly intact. Proprioception is intact. No peripheral adenopathy or edema is identified. No motor or sensory levels are noted. Crude visual fields are within normal range.  RADIOLOGY RESULTS: CT scan of the chest reviewed compatible with above-stated findings  PLAN: Present time patient is doing well 6 months out with no evidence of disease.  She continues on letrozole without side effect.  I have asked to see her back in 6 months for follow-up.  She will have been scheduled for mammograms before that time.  Patient knows to call with any concerns at any time.  I would like to take this opportunity to thank you for allowing me to participate in the care of your patient.Noreene Filbert, MD

## 2021-07-24 DIAGNOSIS — M5416 Radiculopathy, lumbar region: Secondary | ICD-10-CM | POA: Diagnosis not present

## 2021-07-24 DIAGNOSIS — M5136 Other intervertebral disc degeneration, lumbar region: Secondary | ICD-10-CM | POA: Diagnosis not present

## 2021-07-24 DIAGNOSIS — M48062 Spinal stenosis, lumbar region with neurogenic claudication: Secondary | ICD-10-CM | POA: Diagnosis not present

## 2021-08-06 DIAGNOSIS — M5416 Radiculopathy, lumbar region: Secondary | ICD-10-CM | POA: Diagnosis not present

## 2021-08-06 DIAGNOSIS — M48062 Spinal stenosis, lumbar region with neurogenic claudication: Secondary | ICD-10-CM | POA: Diagnosis not present

## 2021-08-21 ENCOUNTER — Other Ambulatory Visit: Payer: Self-pay | Admitting: Internal Medicine

## 2021-08-21 DIAGNOSIS — Z853 Personal history of malignant neoplasm of breast: Secondary | ICD-10-CM

## 2021-08-25 DIAGNOSIS — I700 Atherosclerosis of aorta: Secondary | ICD-10-CM | POA: Diagnosis not present

## 2021-08-25 DIAGNOSIS — I25118 Atherosclerotic heart disease of native coronary artery with other forms of angina pectoris: Secondary | ICD-10-CM | POA: Diagnosis not present

## 2021-08-25 DIAGNOSIS — I10 Essential (primary) hypertension: Secondary | ICD-10-CM | POA: Diagnosis not present

## 2021-08-25 DIAGNOSIS — I2510 Atherosclerotic heart disease of native coronary artery without angina pectoris: Secondary | ICD-10-CM | POA: Diagnosis not present

## 2021-08-25 DIAGNOSIS — J432 Centrilobular emphysema: Secondary | ICD-10-CM | POA: Diagnosis not present

## 2021-09-04 DIAGNOSIS — M48062 Spinal stenosis, lumbar region with neurogenic claudication: Secondary | ICD-10-CM | POA: Diagnosis not present

## 2021-09-04 DIAGNOSIS — M5416 Radiculopathy, lumbar region: Secondary | ICD-10-CM | POA: Diagnosis not present

## 2021-09-04 DIAGNOSIS — M5136 Other intervertebral disc degeneration, lumbar region: Secondary | ICD-10-CM | POA: Diagnosis not present

## 2021-09-23 ENCOUNTER — Other Ambulatory Visit: Payer: Self-pay

## 2021-09-23 ENCOUNTER — Ambulatory Visit
Admission: RE | Admit: 2021-09-23 | Discharge: 2021-09-23 | Disposition: A | Discharge status: Home / Self Care | Payer: Medicare Other | Source: Ambulatory Visit | Attending: Internal Medicine | Admitting: Internal Medicine

## 2021-09-23 DIAGNOSIS — Z853 Personal history of malignant neoplasm of breast: Secondary | ICD-10-CM | POA: Diagnosis not present

## 2021-09-23 DIAGNOSIS — R922 Inconclusive mammogram: Secondary | ICD-10-CM | POA: Diagnosis not present

## 2021-09-25 NOTE — Progress Notes (Unsigned)
[place]  [phone]) [phone] [phone]) [phone]  ID: ARNITA KOONS OB: 1941/01/22  MR#: [id number]  IOE#:[id number]  [name], [name], [name], [name] Grayland Ormond, Kathlene November, MD as Consulting Physician (Oncology) Noreene Filbert, MD as Consulting Physician (Radiation Oncology) Benjamine Sprague, DO as Consulting Physician (Surgery)  I connected with [name] on 09/25/21 at  3:30 PM EDT by {Blank single:19197::"video enabled telemedicine visit","telephone visit"} and verified that I am speaking with the correct person using two identifiers.   I discussed the limitations, risks, security and privacy concerns of performing an evaluation and management service by telemedicine and the availability of in-person appointments. I also discussed with the patient that there may be a patient responsible charge related to this service. The patient expressed understanding and agreed to proceed.   Other persons participating in the visit and their role in the encounter: Patient, MD.  Patients location: Home. Providers location: Clinic.  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  INTERVAL HISTORY: Patient returns to clinic today for routine 3 month evaluation and to assess her toleration of letrozole.  She currently feels well and is asymptomatic.  She is tolerating letrozole without significant side effects. She has no neurologic complaints.  She denies any recent fevers or illnesses.  She has a good appetite and denies weight loss.  She has no chest pain, shortness of breath, cough, or hemoptysis.  She denies any nausea, vomiting, constipation, or diarrhea.  She has no urinary complaints.  Patient offers no specific  complaints today.  REVIEW OF SYSTEMS:   Review of Systems  Constitutional: Negative.  Negative for fever, malaise/fatigue and weight loss.  Respiratory: Negative.  Negative for cough, hemoptysis and shortness of breath.   Cardiovascular: Negative.  Negative for chest pain and leg swelling.  Gastrointestinal: Negative.  Negative for abdominal pain.  Genitourinary: Negative.  Negative for dysuria.  Musculoskeletal: Negative.  Negative for back pain.  Skin: Negative.  Negative for rash.  Neurological: Negative.  Negative for dizziness, focal weakness, weakness and headaches.  Psychiatric/Behavioral: Negative.  The patient is not nervous/anxious.    As per HPI. Otherwise, a complete review of systems is negative.  PAST MEDICAL HISTORY: Past Medical History:  Diagnosis Date   Allergy    Arthritis    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 ([facility]) 10/23/2020   Emphysema lung (HCC)    Esophagitis    Gastritis    Gastroesophageal reflux disease without esophagitis    History of kidney stones    Hypercholesteremia    Hypertension    Lipoma of neck 04/27/2018   Personal history of radiation therapy    Sciatica    Tendonitis, Achilles 2016   both    Thyroid nodule 2019    PAST SURGICAL HISTORY: Past Surgical History:  Procedure Laterality Date   ABDOMINAL HYSTERECTOMY  1985   total for bleeding   APPENDECTOMY     BIOPSY THYROID  2019   BLADDER SUSPENSION  2004   BREAST BIOPSY Left 10/09/2020   [place] Bx, Q-clip, IMC   BREAST LUMPECTOMY,RADIO FREQ LOCALIZER,AXILLARY SENTINEL LYMPH NODE BIOPSY Left 11/06/2020   INVASIVE MAMMARY CARCINOMA, NO SPECIAL TYPE BREAST LUMPECTOMY,RADIO FREQ LOCALIZER,AXILLARY SENTINEL LYMPH NODE BIOPSY;  Surgeon: Benjamine Sprague, DO;  Location: [facility] ORS;  General;  Laterality: Left;  ° lipoma excision  03/2018  ° posterior neck  ° ° °FAMILY HISTORY: °Family History  °Problem Relation Age of Onset  ° CAD Mother   ° Diabetes Mother   °  Arthritis Mother   ° Hypertension Mother   ° Parkinson's disease Father   ° COPD Father   ° Hearing loss Father   ° Breast cancer Neg Hx   ° ° °ADVANCED DIRECTIVES (Y/N):  N ° °HEALTH MAINTENANCE: °Social History  ° °Tobacco Use  ° Smoking status: Former  °  Packs/day: 1.00  °  Years: 35.00  °  Pack years: 35.00  °  Types: Cigarettes  °  Quit date: 09/01/2002  °  Years since quitting: 19.0  ° Smokeless tobacco: Never  ° Tobacco comments:  °  smoking cessation materials not required  °Vaping Use  ° Vaping Use: Never used  °Substance Use Topics  ° Alcohol use: Yes  °  Alcohol/week: 0.0 standard drinks  °  Comment: RARE  ° Drug use: No  ° ° ° Colonoscopy: ° PAP: ° Bone density: ° Lipid panel: ° °Allergies  °Allergen Reactions  ° Baclofen   °  Other reaction(s): Unknown  ° Etodolac Nausea Only  ° ° °Current Outpatient Medications  °Medication Sig Dispense Refill  ° acetaminophen (TYLENOL) 500 MG tablet Take 1 tablet (500 mg total) by mouth every 6 (six) hours as needed for mild pain or moderate pain. For shoulder pain    ° aspirin EC 81 MG tablet Take 81 mg by mouth daily.    ° Black Elderberry (SAMBUCUS ELDERBERRY PO) Take 1 each by mouth daily. gummy    ° calcium-vitamin D (OSCAL WITH D) 500-200 MG-UNIT tablet Take 1 tablet by mouth daily.    ° ezetimibe (ZETIA) 10 MG tablet Take 1 tablet (10 mg total) by mouth daily. 90 tablet 1  ° ibuprofen (ADVIL) 200 MG tablet Take 400 mg by mouth every 8 (eight) hours as needed for moderate pain.    ° letrozole (FEMARA) 2.5 MG tablet Take 1 tablet (2.5 mg total) by mouth daily. 90 tablet 3  ° loratadine (CLARITIN) 10 MG tablet Take 10 mg by mouth daily.    ° losartan-hydrochlorothiazide (HYZAAR) 100-12.5 MG tablet Take 1 tablet by mouth daily. 90 tablet 1  ° triamcinolone (NASACORT) 55 MCG/ACT AERO nasal inhaler Place 1 spray into the nose at bedtime as needed (allergies).    ° °No current facility-administered medications for this visit.  ° ° °OBJECTIVE: °There were no vitals  filed for this visit. °   There is no height or weight on file to calculate BMI.    ECOG FS:0 - Asymptomatic ° °General: Well-developed, well-nourished, no acute distress. °Eyes: Pink conjunctiva, anicteric sclera. °HEENT: Normocephalic, moist mucous membranes. °Lungs: No audible wheezing or coughing. °Heart: Regular rate and rhythm. °Abdomen: Soft, nontender, no obvious distention. °Musculoskeletal: No edema, cyanosis, or clubbing. °Neuro: Alert, answering all questions appropriately. Cranial nerves grossly intact. °Skin: No rashes or petechiae noted. °Psych: Normal affect. ° °LAB RESULTS: ° °Lab Results  °Component Value Date  ° NA 139 09/25/2020  ° K 4.3 09/25/2020  ° CL 101 09/25/2020  ° CO2 21 09/25/2020  ° GLUCOSE 106 (H) 09/25/2020  ° BUN 19 09/25/2020  ° CREATININE 1.12 (H) 09/25/2020  ° CALCIUM 10.1 09/25/2020  ° PROT 7.1 09/25/2020  ° ALBUMIN 4.4 09/25/2020  ° AST 18 09/25/2020  ° ALT 18 09/25/2020  ° ALKPHOS 48 09/25/2020  °   BILITOT 0.9 09/25/2020  ° GFRNONAA 55 (L) 09/20/2019  ° GFRAA 63 09/20/2019  ° ° °Lab Results  °Component Value Date  ° WBC 6.5 12/10/2020  ° NEUTROABS 3.9 09/25/2020  ° HGB 11.8 (L) 12/10/2020  ° HCT 36.0 12/10/2020  ° MCV 93.3 12/10/2020  ° PLT 321 12/10/2020  ° ° ° °STUDIES: °MM DIAG BREAST TOMO BILATERAL ° °Result Date: 09/23/2021 °CLINICAL DATA:  History of left breast cancer status post lumpectomy in April of 2022. EXAM: DIGITAL DIAGNOSTIC BILATERAL MAMMOGRAM WITH TOMOSYNTHESIS AND CAD TECHNIQUE: Bilateral digital diagnostic mammography and breast tomosynthesis was performed. The images were evaluated with computer-aided detection. COMPARISON:  Previous exam(s). ACR Breast Density Category b: There are scattered areas of fibroglandular density. FINDINGS: Lumpectomy changes in the left breast. No suspicious mass or malignant type microcalcifications in either breast. IMPRESSION: No evidence of malignancy in either breast. RECOMMENDATION: Bilateral diagnostic mammogram in 1 year is  recommended. I have discussed the findings and recommendations with the patient. If applicable, a reminder letter will be sent to the patient regarding the next appointment. BI-RADS CATEGORY  2: Benign. Electronically Signed   By: Dina  Arceo M.D.   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 ° °PLAN:   ° °1.  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  She completed adjuvant XRT on December 26, 2020.  Continue letrozole for a total of 5 years completing in June 2027.  No further intervention is needed.  Return to clinic in 6 months with a video assist telemedicine visit. °2.  Osteopenia:  Patient had a baseline bone mineral density on January 12, 2021 which reported a T-score of -2.2.  Recommended Calcium and vitamin D supplement. Repeat in June 2023. °3. Hypertension: Blood pressure moderately elevated today. Continue evaluation and treatment per primary care.  ° °I provided *** minutes of {Blank single:19197::"face-to-face video visit time","non face-to-face telephone visit time"} during this encounter which included chart review, counseling, and coordination of care as documented above. ° ° ° °Patient expressed understanding and was in agreement with this plan. She also understands that She can call clinic at any time with any questions, concerns, or complaints.  ° ° Cancer Staging  °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) °Staging form: Breast, AJCC 8th Edition °- Pathologic stage from 11/13/2020: Stage IA (pT1a, pN0, cM0, G1, ER+, PR+, HER2-) - Signed by Finnegan, Timothy J, MD on 11/13/2020 °Stage prefix: Initial diagnosis °Histologic grading system: 3 grade system ° ° °Timothy J Finnegan, MD    09/25/2021 9:00 AM ° ° ° ° °

## 2021-09-28 ENCOUNTER — Ambulatory Visit (INDEPENDENT_AMBULATORY_CARE_PROVIDER_SITE_OTHER): Payer: Medicare Other

## 2021-09-28 DIAGNOSIS — Z0000 Encounter for general adult medical examination without abnormal findings: Secondary | ICD-10-CM | POA: Diagnosis not present

## 2021-09-28 NOTE — Patient Instructions (Signed)
[name] , Thank you for taking time to come for your Medicare Wellness Visit. I appreciate your ongoing commitment to your health goals. Please review the following plan we discussed and let me know if I can assist you in the future.   Screening recommendations/referrals: Colonoscopy: no longer required Mammogram: done 09/23/21 Bone Density: done 01/12/21 Recommended yearly ophthalmology/optometry visit for glaucoma screening and checkup Recommended yearly dental visit for hygiene and checkup  Vaccinations: Influenza vaccine: done 04/21/21 Pneumococcal vaccine: done 06/24/14 Tdap vaccine: done 08/29/15 Shingles vaccine: Shingrix discussed. Please contact your pharmacy for coverage information.  Covid-19: done 04/10/20 & 05/01/20  Advanced directives: Please bring a copy of your health care power of attorney and living will to the office at your convenience once you have completed those documents.   Conditions/risks identified: Recommend increasing physical activity   Next appointment: Follow up in one year for your annual wellness visit    Preventive Care 65 Years and Older, Female Preventive care refers to lifestyle choices and visits with your health care provider that can promote health and wellness. What does preventive care include? A yearly physical exam. This is also called an annual well check. Dental exams once or twice a year. Routine eye exams. Ask your health care provider how often you should have your eyes checked. Personal lifestyle choices, including: Daily care of your teeth and gums. Regular physical activity. Eating a healthy diet. Avoiding tobacco and drug use. Limiting alcohol use. Practicing safe sex. Taking low-dose aspirin every day. Taking vitamin and mineral supplements as recommended by your health care provider. What happens during an annual well check? The services and screenings done by your health care provider during your annual well check will  depend on your age, overall health, lifestyle risk factors, and family history of disease. Counseling  Your health care provider may ask you questions about your: Alcohol use. Tobacco use. Drug use. Emotional well-being. Home and relationship well-being. Sexual activity. Eating habits. History of falls. Memory and ability to understand (cognition). Work and work Statistician. Reproductive health. Screening  You may have the following tests or measurements: Height, weight, and BMI. Blood pressure. Lipid and cholesterol levels. These may be checked every 5 years, or more frequently if you are over 21 years old. Skin check. Lung cancer screening. You may have this screening every year starting at age 63 if you have a 30-pack-year history of smoking and currently smoke or have quit within the past 15 years. Fecal occult blood test (FOBT) of the stool. You may have this test every year starting at age 96. Flexible sigmoidoscopy or colonoscopy. You may have a sigmoidoscopy every 5 years or a colonoscopy every 10 years starting at age 44. Hepatitis C blood test. Hepatitis B blood test. Sexually transmitted disease (STD) testing. Diabetes screening. This is done by checking your blood sugar (glucose) after you have not eaten for a while (fasting). You may have this done every 1-3 years. Bone density scan. This is done to screen for osteoporosis. You may have this done starting at age 46. Mammogram. This may be done every 1-2 years. Talk to your health care provider about how often you should have regular mammograms. Talk with your health care provider about your test results, treatment options, and if necessary, the need for more tests. Vaccines  Your health care provider may recommend certain vaccines, such as: Influenza vaccine. This is recommended every year. Tetanus, diphtheria, and acellular pertussis (Tdap, Td) vaccine. You may need a Td  booster every 10 years. Zoster vaccine. You may  need this after age 72. Pneumococcal 13-valent conjugate (PCV13) vaccine. One dose is recommended after age 64. Pneumococcal polysaccharide (PPSV23) vaccine. One dose is recommended after age 77. Talk to your health care provider about which screenings and vaccines you need and how often you need them. This information is not intended to replace advice given to you by your health care provider. Make sure you discuss any questions you have with your health care provider. Document Released: 08/01/2015 Document Revised: 03/24/2016 Document Reviewed: 05/06/2015 Elsevier Interactive Patient Education  2017 [place] Prevention in the Home Falls can cause injuries. They can happen to people of all ages. There are many things you can do to make your home safe and to help prevent falls. What can I do on the outside of my home? Regularly fix the edges of walkways and driveways and fix any cracks. Remove anything that might make you trip as you walk through a door, such as a raised step or threshold. Trim any bushes or trees on the path to your home. Use bright outdoor lighting. Clear any walking paths of anything that might make someone trip, such as rocks or tools. Regularly check to see if handrails are loose or broken. Make sure that both sides of any steps have handrails. Any raised decks and porches should have guardrails on the edges. Have any leaves, snow, or ice cleared regularly. Use sand or salt on walking paths during winter. Clean up any spills in your garage right away. This includes oil or grease spills. What can I do in the bathroom? Use night lights. Install grab bars by the toilet and in the tub and shower. Do not use towel bars as grab bars. Use non-skid mats or decals in the tub or shower. If you need to sit down in the shower, use a plastic, non-slip stool. Keep the floor dry. Clean up any water that spills on the floor as soon as it happens. Remove soap buildup in  the tub or shower regularly. Attach bath mats securely with double-sided non-slip rug tape. Do not have throw rugs and other things on the floor that can make you trip. What can I do in the bedroom? Use night lights. Make sure that you have a light by your bed that is easy to reach. Do not use any sheets or blankets that are too big for your bed. They should not hang down onto the floor. Have a firm chair that has side arms. You can use this for support while you get dressed. Do not have throw rugs and other things on the floor that can make you trip. What can I do in the kitchen? Clean up any spills right away. Avoid walking on wet floors. Keep items that you use a lot in easy-to-reach places. If you need to reach something above you, use a strong step stool that has a grab bar. Keep electrical cords out of the way. Do not use floor polish or wax that makes floors slippery. If you must use wax, use non-skid floor wax. Do not have throw rugs and other things on the floor that can make you trip. What can I do with my stairs? Do not leave any items on the stairs. Make sure that there are handrails on both sides of the stairs and use them. Fix handrails that are broken or loose. Make sure that handrails are as long as the stairways. Check any carpeting  to make sure that it is firmly attached to the stairs. Fix any carpet that is loose or worn. Avoid having throw rugs at the top or bottom of the stairs. If you do have throw rugs, attach them to the floor with carpet tape. Make sure that you have a light switch at the top of the stairs and the bottom of the stairs. If you do not have them, ask someone to add them for you. What else can I do to help prevent falls? Wear shoes that: Do not have high heels. Have rubber bottoms. Are comfortable and fit you well. Are closed at the toe. Do not wear sandals. If you use a stepladder: Make sure that it is fully opened. Do not climb a closed  stepladder. Make sure that both sides of the stepladder are locked into place. Ask someone to hold it for you, if possible. Clearly mark and make sure that you can see: Any grab bars or handrails. First and last steps. Where the edge of each step is. Use tools that help you move around (mobility aids) if they are needed. These include: Canes. Walkers. Scooters. Crutches. Turn on the lights when you go into a dark area. Replace any light bulbs as soon as they burn out. Set up your furniture so you have a clear path. Avoid moving your furniture around. If any of your floors are uneven, fix them. If there are any pets around you, be aware of where they are. Review your medicines with your doctor. Some medicines can make you feel dizzy. This can increase your chance of falling. Ask your doctor what other things that you can do to help prevent falls. This information is not intended to replace advice given to you by your health care provider. Make sure you discuss any questions you have with your health care provider. Document Released: 05/01/2009 Document Revised: 12/11/2015 Document Reviewed: 08/09/2014 Elsevier Interactive Patient Education  2017 [employer].

## 2021-09-28 NOTE — Progress Notes (Signed)
Subjective:   Monique Hall is a 81 y.o. female who presents for Medicare Annual (Subsequent) preventive examination.  Virtual Visit via Telephone Note  I connected with  Monique Hall on 09/28/21 at  3:20 PM EDT by telephone and verified that I am speaking with the correct person using two identifiers.  Location: Patient: home Provider: [facility] Persons participating in the virtual visit: [place]   I discussed the limitations, risks, security and privacy concerns of performing an evaluation and management service by telephone and the availability of in person appointments. The patient expressed understanding and agreed to proceed.  Interactive audio and video telecommunications were attempted between this nurse and patient, however failed, due to patient having technical difficulties OR patient did not have access to video capability.  We continued and completed visit with audio only.  Some vital signs may be absent or patient reported.   Clemetine Marker, LPN   Review of Systems     Cardiac Risk Factors include: advanced age (>81mn, >>19women);dyslipidemia;hypertension     Objective:    There were no vitals filed for this visit. There is no height or weight on file to calculate BMI.  Advanced Directives 09/28/2021 07/02/2021 03/31/2021 12/25/2020 11/13/2020 10/31/2020 10/23/2020  Does Patient Have a Medical Advance Directive? No No No No No - No  Would patient like information on creating a medical advance directive? No - Patient declined No - Patient declined No - Patient declined - - No - Patient declined No - Patient declined    Current Medications (verified) Outpatient Encounter Medications as of 09/28/2021  Medication Sig   acetaminophen (TYLENOL) 500 MG tablet Take 1 tablet (500 mg total) by mouth every 6 (six) hours as needed for mild pain or moderate pain. For shoulder pain   aspirin EC 81 MG tablet Take 81 mg by mouth daily.   Black Elderberry (SAMBUCUS  ELDERBERRY PO) Take 1 each by mouth daily. gummy   calcium-vitamin D (OSCAL WITH D) 500-200 MG-UNIT tablet Take 1 tablet by mouth daily.   ezetimibe (ZETIA) 10 MG tablet Take 1 tablet (10 mg total) by mouth daily.   ibuprofen (ADVIL) 200 MG tablet Take 400 mg by mouth every 8 (eight) hours as needed for moderate pain.   letrozole (FEMARA) 2.5 MG tablet Take 1 tablet (2.5 mg total) by mouth daily.   loratadine (CLARITIN) 10 MG tablet Take 10 mg by mouth daily.   losartan-hydrochlorothiazide (HYZAAR) 100-12.5 MG tablet Take 1 tablet by mouth daily.   triamcinolone (NASACORT) 55 MCG/ACT AERO nasal inhaler Place 1 spray into the nose at bedtime as needed (allergies).   No facility-administered encounter medications on file as of 09/28/2021.    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 ([place] 10/23/2020   Esophagitis    Gastritis    Gastroesophageal reflux disease without esophagitis    History of kidney stones    Hypercholesteremia    Hypertension    Lipoma of neck 04/27/2018   Personal history of radiation therapy    Sciatica    Tendonitis, Achilles 2016   both    Thyroid nodule 2019   Past Surgical History:  Procedure Laterality Date   [place]  total for bleeding   APPENDECTOMY     BIOPSY THYROID  2019   BLADDER SUSPENSION  2004  BREAST BIOPSY Left 10/09/2020   [place] Bx, Q-clip, IMC   BREAST LUMPECTOMY,RADIO FREQ LOCALIZER,AXILLARY SENTINEL LYMPH NODE BIOPSY Left 11/06/2020   INVASIVE MAMMARY CARCINOMA, NO SPECIAL TYPE BREAST LUMPECTOMY,RADIO FREQ LOCALIZER,AXILLARY SENTINEL LYMPH NODE BIOPSY;  Surgeon: Benjamine Sprague, DO;  Location: [facility] ORS;  Service: General;  Laterality: Left;   lipoma excision  03/2018   posterior neck   Family History  Problem Relation Age of Onset   CAD Mother    Diabetes Mother    Arthritis Mother     Hypertension Mother    Parkinson's disease Father    COPD Father    Hearing loss Father    Breast cancer Neg Hx    Social History   Socioeconomic History   Marital status: Widowed    Spouse name: Not on file   Number of children: 3   Years of education: Not on file   Highest education level: 12th grade  Occupational History   Occupation: Retired  Tobacco Use   Smoking status: Former    Packs/day: 1.00    Years: 35.00    Pack years: 35.00    Types: Cigarettes    Quit date: 09/01/2002    Years since quitting: 19.0   Smokeless tobacco: Never   Tobacco comments:    smoking cessation materials not required  Vaping Use   Vaping Use: Never used  Substance and Sexual Activity   Alcohol use: Yes    Alcohol/week: 0.0 standard drinks    Comment: RARE   Drug use: Never   Sexual activity: Not Currently    Birth control/protection: Surgical    Comment: Hysterectomy  Other Topics Concern   Not on file  Social History Narrative   Pt's daughter lives with her   Social Determinants of Health   Financial Resource Strain: Low Risk    Difficulty of Paying Living Expenses: Not very hard  Food Insecurity: No Food Insecurity   Worried About Charity fundraiser in the Last Year: Never true   Ran Out of Food in the Last Year: Never true  Transportation Needs: No Transportation Needs   Lack of Transportation (Medical): No   Lack of Transportation (Non-Medical): No  Physical Activity: Inactive   Days of Exercise per Week: 0 days   Minutes of Exercise per Session: 0 min  Stress: No Stress Concern Present   Feeling of Stress : Not at all  Social Connections: Moderately Isolated   Frequency of Communication with Friends and Family: More than three times a week   Frequency of Social Gatherings with Friends and Family: More than three times a week   Attends Religious Services: More than 4 times per year   Active Member of [employer] or Organizations: No   Attends Archivist  Meetings: Never   Marital Status: Widowed    Tobacco Counseling Counseling given: Not Answered Tobacco comments: smoking cessation materials not required   Clinical Intake:  Pre-visit preparation completed: Yes  Pain : No/denies pain     Nutritional Risks: None Diabetes: No  How often do you need to have someone help you when you read instructions, pamphlets, or other written materials from your doctor or pharmacy?: 1 - Never    Interpreter Needed?: No  Information entered by :: Clemetine Marker LPN   Activities of Daily Living In your present state of health, do you have any difficulty performing the following activities: 09/28/2021 04/02/2021  Hearing? Tempie Donning  Comment wears hearing aids -  Vision? N N  Difficulty concentrating or making decisions? N N  Walking or climbing stairs? N N  Dressing or bathing? N N  Doing errands, shopping? N N  Preparing Food and eating ? N -  Using the Toilet? N -  In the past six months, have you accidently leaked urine? N -  Do you have problems with loss of bowel control? N -  Managing your Medications? N -  Managing your Finances? N -  Housekeeping or managing your Housekeeping? N -  Some recent data might be hidden    [name], [name], [name], [name] Grayland Ormond, Kathlene November, MD as Consulting Physician (Oncology) Noreene Filbert, MD as Consulting Physician (Radiation Oncology) Andrez Grime, MD as Referring Physician (Cardiology)  Indicate any recent Medical Services you may have received from other than [facility] providers in the past year (date may be approximate).     Assessment:   This is a routine wellness examination for [facility].  Hearing/Vision screen Hearing Screening - Comments:: Patient wears hearing aids maintained by [facility] Vision Screening  - Comments:: Dr. Ellin Mayhew for annual eye exams  Dietary issues and exercise activities discussed: Current Exercise Habits: The patient does not participate in regular exercise at present, Exercise limited by: respiratory conditions(s)   Goals Addressed             This Visit's Progress    DIET - INCREASE WATER INTAKE   On track    Recommend to drink at least 6-8 8oz glasses of water per day.       Depression Screen PHQ 2/9 Scores 09/28/2021 04/02/2021 09/25/2020 09/24/2020 09/20/2019 09/17/2019 09/15/2018  PHQ - 2 Score 0 0 0 0 0 0 0  PHQ- 9 Score - 1 0 - - - 0    Fall Risk Fall Risk  09/28/2021 04/02/2021 09/25/2020 09/24/2020 09/20/2019  Falls in the past year? 0 0 0 0 0  Number falls in past yr: 0 0 - 0 0  Injury with Fall? 0 0 - 0 0  Risk for fall due to : No Fall Risks No Fall Risks - No Fall Risks -  Follow up Falls prevention discussed Falls evaluation completed Falls evaluation completed Falls prevention discussed Falls evaluation completed    FALL RISK PREVENTION PERTAINING TO THE HOME:  Any stairs in or around the home? Yes  If so, are there any without handrails? No  Home free of loose throw rugs in walkways, pet beds, electrical cords, etc? Yes  Adequate lighting in your home to reduce risk of falls? Yes   ASSISTIVE DEVICES UTILIZED TO PREVENT FALLS:  Life alert? No  Use of a cane, walker or w/c? No  Grab bars in the bathroom? Yes  Shower chair or bench in shower? Yes  Elevated toilet seat or a handicapped toilet? No   TIMED UP AND GO:  Was the test performed? No . Telephonic visit  Cognitive Function: Normal cognitive status assessed by direct observation by this Nurse Health Advisor. No abnormalities found.       6CIT Screen 09/20/2019 09/17/2019 09/11/2018 09/05/2017 09/02/2016  What Year? 0 points 0 points 0 points 0 points 0 points  What month? 0 points 0 points 0 points 0 points 0 points  What time? 0 points 0 points 0 points 0 points 0 points  Count back from 20 0  points 0  points 0 points 0 points 0 points  Months in reverse 0 points 0 points 0 points 0 points 0 points  Repeat phrase 2 points 0 points 0 points 6 points 0 points  Total Score 2 0 0 6 0    Immunizations Immunization History  Administered Date(s) Administered   Fluad Quad(high Dose 65+) 03/27/2020, 04/21/2021   Influenza-Unspecified 04/28/2017, 05/11/2018, 05/11/2019   PFIZER(Purple Top)SARS-COV-2 Vaccination 04/10/2020, 05/01/2020   Pneumococcal Conjugate-13 06/24/2014   Pneumococcal Polysaccharide-23 07/21/2011   Tdap 07/20/2005, 08/29/2015   Zoster, Live 07/20/2000    TDAP status: Up to date  Flu Vaccine status: Up to date  Pneumococcal vaccine status: Up to date  Covid-19 vaccine status: Completed vaccines  Qualifies for Shingles Vaccine? Yes   Zostavax completed Yes   Shingrix Completed?: No.    Education has been provided regarding the importance of this vaccine. Patient has been advised to call insurance company to determine out of pocket expense if they have not yet received this vaccine. Advised may also receive vaccine at local pharmacy or Health Dept. Verbalized acceptance and understanding.  Screening Tests Health Maintenance  Topic Date Due   Zoster Vaccines- Shingrix (1 of 2) Never done   COVID-19 Vaccine (3 - Pfizer risk series) 05/29/2020   MAMMOGRAM  09/24/2022   TETANUS/TDAP  08/28/2025   Pneumonia Vaccine 44+ Years old  Completed   INFLUENZA VACCINE  Completed   DEXA SCAN  Completed   HPV VACCINES  Aged Out    Health Maintenance  Health Maintenance Due  Topic Date Due   Zoster Vaccines- Shingrix (1 of 2) Never done   COVID-19 Vaccine (3 - Pfizer risk series) 05/29/2020    Colorectal cancer screening: No longer required.   Mammogram status: Completed 09/23/21. Repeat every year  Bone Density status: Completed 01/12/21. Results reflect: Bone density results: OSTEOPENIA. Repeat every 2 years.  Lung Cancer Screening: (Low Dose CT Chest  recommended if Age 54-80 years, 30 pack-year currently smoking OR have quit w/in 15years.) does not qualify.   Additional Screening:  Hepatitis C Screening: does not qualify  Vision Screening: Recommended annual ophthalmology exams for early detection of glaucoma and other disorders of the eye. Is the patient up to date with their annual eye exam?  Yes  Who is the provider or what is the name of the office in which the patient attends annual eye exams? Dr. Ellin Mayhew.   Dental Screening: Recommended annual dental exams for proper oral hygiene  Community Resource Referral / Chronic Care Management: CRR required this visit?  No   CCM required this visit?  No      Plan:     I have personally reviewed and noted the following in the patients chart:   Medical and social history Use of alcohol, tobacco or illicit drugs  Current medications and supplements including opioid prescriptions.  Functional ability and status Nutritional status Physical activity Advanced directives List of other physicians Hospitalizations, surgeries, and ER visits in previous 12 months Vitals Screenings to include cognitive, depression, and falls Referrals and appointments  In addition, I have reviewed and discussed with patient certain preventive protocols, quality metrics, and best practice recommendations. A written personalized care plan for preventive services as well as general preventive health recommendations were provided to patient.     Clemetine Marker, LPN   01/20/8888   Nurse Notes: none

## 2021-09-29 ENCOUNTER — Telehealth: Payer: Self-pay | Admitting: *Deleted

## 2021-09-29 ENCOUNTER — Other Ambulatory Visit: Payer: Self-pay

## 2021-09-29 ENCOUNTER — Inpatient Hospital Stay: Payer: Medicare Other | Admitting: Oncology

## 2021-09-29 DIAGNOSIS — C50212 Malignant neoplasm of upper-inner quadrant of left female breast: Secondary | ICD-10-CM

## 2021-09-29 NOTE — Telephone Encounter (Signed)
Patient missed call back about changing to a virtual appointment from [place] she would like a call back. ?

## 2021-10-01 ENCOUNTER — Other Ambulatory Visit: Payer: Self-pay

## 2021-10-01 ENCOUNTER — Encounter: Payer: Self-pay | Admitting: Internal Medicine

## 2021-10-01 ENCOUNTER — Ambulatory Visit (INDEPENDENT_AMBULATORY_CARE_PROVIDER_SITE_OTHER): Payer: Medicare Other | Admitting: Internal Medicine

## 2021-10-01 VITALS — BP 110/64 | HR 76 | Ht 65.0 in | Wt 170.0 lb

## 2021-10-01 DIAGNOSIS — C50212 Malignant neoplasm of upper-inner quadrant of left female breast: Secondary | ICD-10-CM | POA: Diagnosis not present

## 2021-10-01 DIAGNOSIS — I700 Atherosclerosis of aorta: Secondary | ICD-10-CM

## 2021-10-01 DIAGNOSIS — E782 Mixed hyperlipidemia: Secondary | ICD-10-CM | POA: Diagnosis not present

## 2021-10-01 DIAGNOSIS — Z170 Estrogen receptor positive status [ER+]: Secondary | ICD-10-CM | POA: Diagnosis not present

## 2021-10-01 DIAGNOSIS — M791 Myalgia, unspecified site: Secondary | ICD-10-CM

## 2021-10-01 DIAGNOSIS — J432 Centrilobular emphysema: Secondary | ICD-10-CM

## 2021-10-01 DIAGNOSIS — G720 Drug-induced myopathy: Secondary | ICD-10-CM | POA: Insufficient documentation

## 2021-10-01 DIAGNOSIS — I10 Essential (primary) hypertension: Secondary | ICD-10-CM | POA: Diagnosis not present

## 2021-10-01 DIAGNOSIS — T466X5A Adverse effect of antihyperlipidemic and antiarteriosclerotic drugs, initial encounter: Secondary | ICD-10-CM | POA: Diagnosis not present

## 2021-10-01 LAB — POCT URINALYSIS DIPSTICK
Bilirubin, UA: NEGATIVE
Blood, UA: NEGATIVE
Glucose, UA: NEGATIVE
Ketones, UA: NEGATIVE
Leukocytes, UA: NEGATIVE
Nitrite, UA: NEGATIVE
Protein, UA: NEGATIVE
Spec Grav, UA: 1.01 (ref 1.010–1.025)
Urobilinogen, UA: 0.2 E.U./dL
pH, UA: 6 (ref 5.0–8.0)

## 2021-10-01 NOTE — Progress Notes (Signed)
Date:  10/01/2021   Name:  Monique Hall   DOB:  [birth year]   MRN:  [id number]   Chief Complaint: Annual Exam (Breast Exam. UA.) Monique Hall is a 81 y.o. female who presents today for her Complete Annual Exam. She feels well. She reports exercising. She reports she is sleeping fairly well. Breast complaints - none.  Mammogram: 09/2021 DEXA: 12/2020 Normal Pap smear: discontinued Colonoscopy: aged out - last done 2012  Immunization History  Administered Date(s) Administered   Fluad Quad(high Dose 65+) 03/27/2020, 04/21/2021   Influenza-Unspecified 04/28/2017, 05/11/2018, 05/11/2019   PFIZER(Purple Top)SARS-COV-2 Vaccination 04/10/2020, 05/01/2020   Pneumococcal Conjugate-13 06/24/2014   Pneumococcal Polysaccharide-23 07/21/2011   Tdap 07/20/2005, 08/29/2015   Zoster, Live 07/20/2000    Hypertension This is a chronic problem. The problem is controlled. Pertinent negatives include no chest pain, headaches, palpitations or shortness of breath. Past treatments include angiotensin blockers and diuretics. The current treatment provides significant improvement. There is no history of kidney disease, CAD/MI or CVA.  Hyperlipidemia This is a chronic problem. The problem is resistant. Pertinent negatives include no chest pain or shortness of breath. Current antihyperlipidemic treatment includes ezetimibe. The current treatment provides mild improvement of lipids.   Lab Results  Component Value Date   NA 139 09/25/2020   K 4.3 09/25/2020   CO2 21 09/25/2020   GLUCOSE 106 (H) 09/25/2020   BUN 19 09/25/2020   CREATININE 1.12 (H) 09/25/2020   CALCIUM 10.1 09/25/2020   EGFR 50 (L) 09/25/2020   GFRNONAA 55 (L) 09/20/2019   Lab Results  Component Value Date   CHOL 265 (H) 09/25/2020   HDL 52 09/25/2020   LDLCALC 167 (H) 09/25/2020   TRIG 249 (H) 09/25/2020   CHOLHDL 5.1 (H) 09/25/2020   Lab Results  Component Value Date   TSH 2.820 09/25/2020   No results found for: HGBA1C Lab  Results  Component Value Date   WBC 6.5 12/10/2020   HGB 11.8 (L) 12/10/2020   HCT 36.0 12/10/2020   MCV 93.3 12/10/2020   PLT 321 12/10/2020   Lab Results  Component Value Date   ALT 18 09/25/2020   AST 18 09/25/2020   ALKPHOS 48 09/25/2020   BILITOT 0.9 09/25/2020   No results found for: 25OHVITD2, 25OHVITD3, VD25OH   Review of Systems  Constitutional:  Negative for chills, fatigue and fever.  HENT:  Negative for congestion, hearing loss, tinnitus, trouble swallowing and voice change.   Eyes:  Negative for visual disturbance.  Respiratory:  Negative for cough, chest tightness, shortness of breath and wheezing.   Cardiovascular:  Negative for chest pain, palpitations and leg swelling.  Gastrointestinal:  Negative for abdominal pain, constipation, diarrhea and vomiting.  Endocrine: Negative for polydipsia and polyuria.  Genitourinary:  Negative for dysuria, frequency, genital sores, vaginal bleeding and vaginal discharge.  Musculoskeletal:  Negative for arthralgias, gait problem and joint swelling.  Skin:  Negative for color change and rash.  Neurological:  Negative for dizziness, tremors, light-headedness and headaches.  Hematological:  Negative for adenopathy. Does not bruise/bleed easily.  Psychiatric/Behavioral:  Negative for dysphoric mood and sleep disturbance. The patient is not nervous/anxious.    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) 10/23/2020   Shoulder pain, bilateral 01/24/2020   Aortic atherosclerosis (HCC) 09/15/2018   Centrilobular emphysema (HCC) 09/15/2018   Atherosclerotic heart disease of native coronary artery with other forms of angina pectoris (HCC)  09/15/2018   Thyroid nodule 10/06/2017   Gastroesophageal reflux disease without esophagitis 09/02/2016   Essential hypertension 07/29/2015   Mixed hyperlipidemia 07/29/2015   Menopausal symptoms 07/29/2015   Tobacco use  disorder, moderate, in sustained remission 07/29/2015   Degeneration of intervertebral disc of lumbar region 06/17/2015   Neuritis 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 02/05/2015    Allergies  Allergen Reactions   Baclofen     Other reaction(s): Unknown   Etodolac Nausea Only    Past Surgical History:  Procedure Laterality Date   ABDOMINAL HYSTERECTOMY  1985   total for bleeding   APPENDECTOMY     BIOPSY THYROID  2019   BLADDER SUSPENSION  2004   BREAST BIOPSY Left 10/09/2020   [place] Bx, Q-clip, IMC   BREAST LUMPECTOMY,RADIO FREQ LOCALIZER,AXILLARY SENTINEL LYMPH NODE BIOPSY Left 11/06/2020   INVASIVE MAMMARY CARCINOMA, NO SPECIAL TYPE BREAST LUMPECTOMY,RADIO FREQ LOCALIZER,AXILLARY SENTINEL LYMPH NODE BIOPSY;  Surgeon: Sung Amabile, DO;  Location: [facility] ORS;  Service: General;  Laterality: Left;   lipoma excision  03/2018   posterior neck    Social History   Tobacco Use   Smoking status: Former    Packs/day: 1.00    Years: 35.00    Pack years: 35.00    Types: Cigarettes    Quit date: 09/01/2002    Years since quitting: 19.0   Smokeless tobacco: Never   Tobacco comments:    smoking cessation materials not required  Vaping Use   Vaping Use: Never used  Substance Use Topics   Alcohol use: Yes    Alcohol/week: 0.0 standard drinks    Comment: RARE   Drug use: Never     Medication list has been reviewed and updated.  Current Meds  Medication Sig   acetaminophen (TYLENOL) 500 MG tablet Take 1 tablet (500 mg total) by mouth every 6 (six) hours as needed for mild pain or moderate pain. For shoulder pain   aspirin EC 81 MG tablet Take 81 mg by mouth daily.   Black Elderberry (SAMBUCUS ELDERBERRY PO) Take 1 each by mouth daily. gummy   calcium-vitamin D (OSCAL WITH D) 500-200 MG-UNIT tablet Take 1 tablet by mouth daily.   ezetimibe (ZETIA) 10 MG tablet Take 1 tablet (10 mg total) by mouth daily.   ibuprofen (ADVIL) 200 MG tablet Take 400 mg by mouth  every 8 (eight) hours as needed for moderate pain.   letrozole (FEMARA) 2.5 MG tablet Take 1 tablet (2.5 mg total) by mouth daily.   loratadine (CLARITIN) 10 MG tablet Take 10 mg by mouth daily.   losartan-hydrochlorothiazide (HYZAAR) 100-12.5 MG tablet Take 1 tablet by mouth daily.   triamcinolone (NASACORT) 55 MCG/ACT AERO nasal inhaler Place 1 spray into the nose at bedtime as needed (allergies).    PHQ 2/9 Scores 10/01/2021 09/28/2021 04/02/2021 09/25/2020  PHQ - 2 Score 0 0 0 0  PHQ- 9 Score 0 - 1 0    GAD 7 : Generalized Anxiety Score 10/01/2021 04/02/2021 09/25/2020  Nervous, Anxious, on Edge 0 0 0  Control/stop worrying 0 0 0  Worry too much - different things 0 0 0  Trouble relaxing 0 0 0  Restless 0 0 0  Easily annoyed or irritable 0 0 0  Afraid - awful might happen 0 0 0  Total GAD 7 Score 0 0 0  Anxiety Difficulty Not difficult at all - -    BP Readings from Last 3 Encounters:  10/01/21 110/64  07/02/21 Marland Kitchen)  160/79  04/02/21 136/74    Physical Exam Vitals and nursing note reviewed.  Constitutional:      General: She is not in acute distress.    Appearance: She is well-developed.  HENT:     Head: Normocephalic and atraumatic.     Right Ear: Tympanic membrane and ear canal normal.     Left Ear: Tympanic membrane and ear canal normal.     Nose:     Right Sinus: No maxillary sinus tenderness.     Left Sinus: No maxillary sinus tenderness.  Eyes:     General: No scleral icterus.       Right eye: No discharge.        Left eye: No discharge.     Conjunctiva/sclera: Conjunctivae normal.  Neck:     Thyroid: No thyromegaly.     Vascular: No carotid bruit.  Cardiovascular:     Rate and Rhythm: Normal rate and regular rhythm.     Pulses: Normal pulses.     Heart sounds: Normal heart sounds.  Pulmonary:     Effort: Pulmonary effort is normal. No respiratory distress.     Breath sounds: No wheezing.  Chest:  Breasts:    Right: No mass, nipple discharge, skin change or  tenderness.     Left: Skin change present. No mass, nipple discharge or tenderness.       Comments: Lumpectomy and lymph node dissection scars Mild skin thickening 2/2 XRT Abdominal:     General: Bowel sounds are normal.     Palpations: Abdomen is soft.     Tenderness: There is no abdominal tenderness.  Musculoskeletal:     Cervical back: Normal range of motion. No erythema.     Right lower leg: No edema.     Left lower leg: No edema.  Lymphadenopathy:     Cervical: No cervical adenopathy.  Skin:    General: Skin is warm and dry.     Findings: No rash.  Neurological:     Mental Status: She is alert and oriented to person, place, and time.     Cranial Nerves: No cranial nerve deficit.     Sensory: No sensory deficit.     Deep Tendon Reflexes: Reflexes are normal and symmetric.  Psychiatric:        Attention and Perception: Attention normal.        Mood and Affect: Mood normal.    Wt Readings from Last 3 Encounters:  10/01/21 170 lb (77.1 kg)  07/02/21 171 lb 3.2 oz (77.7 kg)  04/02/21 171 lb (77.6 kg)    BP 110/64   Pulse 76   Ht 5\' 5"  (1.651 m)   Wt 170 lb (77.1 kg)   SpO2 97%   BMI 28.29 kg/m   Assessment and Plan: 1. Essential hypertension Clinically stable exam with well controlled BP. Tolerating medications without side effects at this time. Pt to continue current regimen and low sodium diet; benefits of regular exercise as able discussed. - CBC with Differential/Platelet - Comprehensive metabolic panel - TSH - POCT urinalysis dipstick  2. Aortic atherosclerosis (HCC) Intolerant of statins - Lipid panel  3. Mixed hyperlipidemia On Zetia with moderate improvement - Lipid panel  4. Carcinoma of upper-inner quadrant of left breast in female, estrogen receptor positive (HCC) Doing well; continues on AI therapy Follow up with Oncology yearly Recent mammogram was normal  5. Centrilobular emphysema (HCC) asymptomatic  6. Myalgia due to  statin   Partially dictated using Animal nutritionist. Any  errors are unintentional.  Bari Edward, MD [facility]  10/01/2021

## 2021-10-02 LAB — LIPID PANEL
Chol/HDL Ratio: 4.5 ratio — ABNORMAL HIGH (ref 0.0–4.4)
Cholesterol, Total: 242 mg/dL — ABNORMAL HIGH (ref 100–199)
HDL: 54 mg/dL (ref >39)
LDL Chol Calc (NIH): 158 mg/dL — ABNORMAL HIGH (ref 0–99)
Triglycerides: 167 mg/dL — ABNORMAL HIGH (ref 0–149)
VLDL Cholesterol Cal: 30 mg/dL (ref 5–40)

## 2021-10-02 LAB — CBC WITH DIFFERENTIAL/PLATELET
Basophils Absolute: 0.1 10*3/uL (ref 0.0–0.2)
Basos: 1 %
EOS (ABSOLUTE): 0.3 10*3/uL (ref 0.0–0.4)
Eos: 4 %
Hematocrit: 38 % (ref 34.0–46.6)
Hemoglobin: 12.4 g/dL (ref 11.1–15.9)
Immature Grans (Abs): 0 10*3/uL (ref 0.0–0.1)
Immature Granulocytes: 0 %
Lymphocytes Absolute: 2.6 10*3/uL (ref 0.7–3.1)
Lymphs: 39 %
MCH: 30 pg (ref 26.6–33.0)
MCHC: 32.6 g/dL (ref 31.5–35.7)
MCV: 92 fL (ref 79–97)
Monocytes Absolute: 0.8 10*3/uL (ref 0.1–0.9)
Monocytes: 12 %
Neutrophils Absolute: 3 10*3/uL (ref 1.4–7.0)
Neutrophils: 44 %
Platelets: 401 10*3/uL (ref 150–450)
RBC: 4.14 x10E6/uL (ref 3.77–5.28)
RDW: 13.1 % (ref 11.7–15.4)
WBC: 6.7 10*3/uL (ref 3.4–10.8)

## 2021-10-02 LAB — COMPREHENSIVE METABOLIC PANEL
ALT: 16 IU/L (ref 0–32)
AST: 23 IU/L (ref 0–40)
Albumin/Globulin Ratio: 1.5 (ref 1.2–2.2)
Albumin: 4.4 g/dL (ref 3.7–4.7)
Alkaline Phosphatase: 42 IU/L — ABNORMAL LOW (ref 44–121)
BUN/Creatinine Ratio: 17 (ref 12–28)
BUN: 18 mg/dL (ref 8–27)
Bilirubin Total: 1.1 mg/dL (ref 0.0–1.2)
CO2: 25 mmol/L (ref 20–29)
Calcium: 10.3 mg/dL (ref 8.7–10.3)
Chloride: 100 mmol/L (ref 96–106)
Creatinine, Ser: 1.05 mg/dL — ABNORMAL HIGH (ref 0.57–1.00)
Globulin, Total: 2.9 g/dL (ref 1.5–4.5)
Glucose: 104 mg/dL — ABNORMAL HIGH (ref 70–99)
Potassium: 4.2 mmol/L (ref 3.5–5.2)
Sodium: 139 mmol/L (ref 134–144)
Total Protein: 7.3 g/dL (ref 6.0–8.5)
eGFR: 54 mL/min/{1.73_m2} — ABNORMAL LOW (ref >59)

## 2021-10-02 LAB — TSH: TSH: 2.01 u[IU]/mL (ref 0.450–4.500)

## 2021-10-05 ENCOUNTER — Other Ambulatory Visit: Payer: Self-pay | Admitting: Internal Medicine

## 2021-10-05 ENCOUNTER — Other Ambulatory Visit: Payer: Self-pay | Admitting: Oncology

## 2021-10-05 DIAGNOSIS — I10 Essential (primary) hypertension: Secondary | ICD-10-CM

## 2021-10-05 DIAGNOSIS — E782 Mixed hyperlipidemia: Secondary | ICD-10-CM

## 2021-10-06 NOTE — Telephone Encounter (Signed)
Requested Prescriptions  ?Pending Prescriptions Disp Refills  ?? losartan-hydrochlorothiazide (HYZAAR) 100-12.5 MG tablet [Pharmacy Med Name: LOSARTAN POTASSIUM/HYDROCHLOROTHIAZIDE 100-12.5 MG Tablet] 90 tablet 1  ?  Sig: TAKE 1 TABLET EVERY DAY  ?  ? Cardiovascular: ARB + Diuretic Combos Failed - 10/05/2021  8:12 AM  ?  ?  Failed - Cr in normal range and within 180 days  ?  Creatinine, Ser  ?Date Value Ref Range Status  ?10/01/2021 1.05 (H) 0.57 - 1.00 mg/dL Final  ?   ?  ?  Passed - K in normal range and within 180 days  ?  Potassium  ?Date Value Ref Range Status  ?10/01/2021 4.2 3.5 - 5.2 mmol/L Final  ?   ?  ?  Passed - Na in normal range and within 180 days  ?  Sodium  ?Date Value Ref Range Status  ?10/01/2021 139 134 - 144 mmol/L Final  ?   ?  ?  Passed - eGFR is 10 or above and within 180 days  ?  GFR calc Af Amer  ?Date Value Ref Range Status  ?09/20/2019 63 >59 mL/min/1.73 Final  ? ?GFR calc non Af Amer  ?Date Value Ref Range Status  ?09/20/2019 55 (L) >59 mL/min/1.73 Final  ? ?eGFR  ?Date Value Ref Range Status  ?10/01/2021 54 (L) >59 mL/min/1.73 Final  ?   ?  ?  Passed - Patient is not pregnant  ?  ?  Passed - Last BP in normal range  ?  BP Readings from Last 1 Encounters:  ?10/01/21 110/64  ?   ?  ?  Passed - Valid encounter within last 6 months  ?  Recent Outpatient Visits   ?      ? 5 days ago Essential hypertension  ? [facility] Glean Hess, MD  ? 6 months ago Essential hypertension  ? [facility] Glean Hess, MD  ? 7 months ago Acute maxillary sinusitis, recurrence not specified  ? [facility] Juline Patch, MD  ? 1 year ago Essential hypertension  ? [facility] Glean Hess, MD  ? 1 year ago Essential hypertension  ? [facility] Glean Hess, MD  ?  ?  ?Future Appointments   ?        ? In 1 week Grayland Ormond, Kathlene November, MD [facility] [place]  ? In 5 months Army Melia Jesse Sans, MD [facility] [place]   ?  ? ?  ?  ?  ?? ezetimibe (ZETIA) 10 MG tablet [Pharmacy Med Name: EZETIMIBE 10 MG Tablet] 90 tablet 1  ?  Sig: TAKE 1 TABLET EVERY DAY  ?  ? Cardiovascular:  Antilipid - Sterol Transport Inhibitors Failed - 10/05/2021  8:12 AM  ?  ?  Failed - Lipid Panel in normal range within the last 12 months  ?  Cholesterol, Total  ?Date Value Ref Range Status  ?10/01/2021 242 (H) 100 - 199 mg/dL Final  ? ?LDL Chol Calc (NIH)  ?Date Value Ref Range Status  ?10/01/2021 158 (H) 0 - 99 mg/dL Final  ? ?HDL  ?Date Value Ref Range Status  ?10/01/2021 54 >39 mg/dL Final  ? ?Triglycerides  ?Date Value Ref Range Status  ?10/01/2021 167 (H) 0 - 149 mg/dL Final  ? ?  ?  ?  Passed - AST in normal range and within 360 days  ?  AST  ?Date Value Ref Range Status  ?10/01/2021 23  0 - 40 IU/L Final  ?   ?  ?  Passed - ALT in normal range and within 360 days  ?  ALT  ?Date Value Ref Range Status  ?10/01/2021 16 0 - 32 IU/L Final  ?   ?  ?  Passed - Patient is not pregnant  ?  ?  Passed - Valid encounter within last 12 months  ?  Recent Outpatient Visits   ?      ? 5 days ago Essential hypertension  ? [facility] Glean Hess, MD  ? 6 months ago Essential hypertension  ? [facility] Glean Hess, MD  ? 7 months ago Acute maxillary sinusitis, recurrence not specified  ? [facility] Juline Patch, MD  ? 1 year ago Essential hypertension  ? [facility] Glean Hess, MD  ? 1 year ago Essential hypertension  ? [facility] Glean Hess, MD  ?  ?  ?Future Appointments   ?        ? In 1 week Grayland Ormond, Kathlene November, MD [facility] [place]  ? In 5 months Army Melia Jesse Sans, MD [facility] [place]  ?  ? ?  ?  ?  ? ? ?

## 2021-10-13 NOTE — Progress Notes (Signed)
?[place]  ?[phone] [phone] ? ?ID: Monique Hall OB: January 10, 1941  MR#: [id number]  BZJ#:[id number] ? ?Patient Care Team: ?Glean Hess, MD as PCP - General (Internal Medicine) ?Sharlet Salina, MD as Referring Physician (Physical Medicine and Rehabilitation) ?Rico Junker, [name] ?Lloyd Huger, MD as Consulting Physician (Oncology) ?Noreene Filbert, MD as Consulting Physician (Radiation Oncology) ?Andrez Grime, MD as Referring Physician (Cardiology) ? ?I connected with Monique Hall on 10/16/21 at  3:00 PM EDT by video enabled telemedicine visit and verified that I am speaking with the correct person using two identifiers.  ? ?I discussed the limitations, risks, security and privacy concerns of performing an evaluation and management service by telemedicine and the availability of in-person appointments. I also discussed with the patient that there may be a patient responsible charge related to this service. The patient expressed understanding and agreed to proceed.  ? ?Other persons participating in the visit and their role in the encounter: Patient, MD. ? ?Patient?s location: Home. ?Provider?s location: Clinic. ? 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 ? ?INTERVAL HISTORY: Patient agreed to video assisted telemedicine visit for routine 66-monthevaluation.  Visit was transitioned to telephone secondary to technical difficulties.  She continues to feel well and remains asymptomatic.  She is tolerating letrozole without significant side effects.  She has no neurologic complaints.  She denies any recent fevers or illnesses.  She has a good appetite and denies weight loss.  She has no chest pain, shortness of breath, cough, or hemoptysis.  She denies any nausea, vomiting, constipation, or diarrhea.  She has no urinary complaints.   Patient offers no specific complaints today. ? ?REVIEW OF SYSTEMS:   ?Review of Systems  ?Constitutional: Negative.  Negative for fever, malaise/fatigue and weight loss.  ?Respiratory: Negative.  Negative for cough, hemoptysis and shortness of breath.   ?Cardiovascular: Negative.  Negative for chest pain and leg swelling.  ?Gastrointestinal: Negative.  Negative for abdominal pain.  ?Genitourinary: Negative.  Negative for dysuria.  ?Musculoskeletal: Negative.  Negative for back pain.  ?Skin: Negative.  Negative for rash.  ?Neurological: Negative.  Negative for dizziness, focal weakness, weakness and headaches.  ?Psychiatric/Behavioral: Negative.  The patient is not nervous/anxious.   ? ?As per HPI. Otherwise, a complete review of systems is negative. ? ?PAST MEDICAL HISTORY: ?Past Medical History:  ?Diagnosis Date  ? Allergy   ? Arthritis   ? 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  ? Esophagitis   ? Gastritis   ? Gastroesophageal reflux disease without esophagitis   ? History of kidney stones   ? Hypercholesteremia   ? Hypertension   ? Lipoma of neck 04/27/2018  ? Personal history of radiation therapy   ? Sciatica   ? Tendonitis, [place] 2016  ? both   ? Thyroid nodule 2019  ? ? ?PAST SURGICAL HISTORY: ?Past Surgical History:  ?Procedure Laterality Date  ? ABDOMINAL HYSTERECTOMY  1985  ? total for bleeding  ? APPENDECTOMY    ? BIOPSY THYROID  2019  ? BLADDER SUSPENSION  2004  ? BREAST BIOPSY Left 10/09/2020  ? [place], Q-clip, IMC  ? BREAST LUMPECTOMY,RADIO FREQ LOCALIZER,AXILLARY SENTINEL LYMPH NODE BIOPSY Left 11/06/2020  ? INVASIVE MAMMARY CARCINOMA, NO SPECIAL TYPE BREAST LUMPECTOMY,RADIO FREQ LOCALIZER,AXILLARY SENTINEL LYMPH NODE BIOPSY;  Surgeon: SBenjamine Sprague DO;  Location: [facility] ORS;  Service: General;  Laterality: Left;  ? lipoma excision  03/2018  ? posterior neck  ? ? ?FAMILY HISTORY: ?Family History  ?Problem Relation Age of Onset  ? CAD Mother   ? Diabetes Mother   ?  Arthritis Mother   ? Hypertension Mother   ? Parkinson's disease Father   ? COPD Father   ? Hearing loss Father   ? Breast cancer Neg Hx   ? ? ?ADVANCED DIRECTIVES (Y/N):  N ? ?HEALTH MAINTENANCE: ?Social History  ? ?Tobacco Use  ? Smoking status: Former  ?  Packs/day: 1.00  ?  Years: 35.00  ?  Pack years: 35.00  ?  Types: Cigarettes  ?  Quit date: 09/01/2002  ?  Years since quitting: 19.1  ? Smokeless tobacco: Never  ? Tobacco comments:  ?  smoking cessation materials not required  ?Vaping Use  ? Vaping Use: Never used  ?Substance Use Topics  ? Alcohol use: Yes  ?  Alcohol/week: 0.0 standard drinks  ?  Comment: RARE  ? Drug use: Never  ? ? ? Colonoscopy: ? PAP: ? Bone density: ? Lipid panel: ? ?Allergies  ?Allergen Reactions  ? Baclofen   ?  Other reaction(s): Unknown  ? Statins Other (See Comments)  ?  myalgia  ? Etodolac Nausea Only  ? ? ?Current Outpatient Medications  ?Medication Sig Dispense Refill  ? acetaminophen (TYLENOL) 500 MG tablet Take 1 tablet (500 mg total) by mouth every 6 (six) hours as needed for mild pain or moderate pain. For shoulder pain    ? aspirin EC 81 MG tablet Take 81 mg by mouth daily.    ? Black Elderberry (SAMBUCUS ELDERBERRY PO) Take 1 each by mouth daily. gummy    ? calcium-vitamin D (OSCAL WITH D) 500-200 MG-UNIT tablet Take 1 tablet by mouth daily.    ? ezetimibe (ZETIA) 10 MG tablet TAKE 1 TABLET EVERY DAY 90 tablet 1  ? ibuprofen (ADVIL) 200 MG tablet Take 400 mg by mouth every 8 (eight) hours as needed for moderate pain.    ? letrozole (FEMARA) 2.5 MG tablet TAKE 1 TABLET EVERY DAY 90 tablet 0  ? loratadine (CLARITIN) 10 MG tablet Take 10 mg by mouth daily.    ? losartan-hydrochlorothiazide (HYZAAR) 100-12.5 MG tablet TAKE 1 TABLET EVERY DAY 90 tablet 1  ? triamcinolone (NASACORT) 55 MCG/ACT AERO nasal inhaler Place 1 spray into the nose at bedtime as needed (allergies).    ? ?No current facility-administered medications for this visit.  ? ? ?OBJECTIVE: ?There were no vitals  filed for this visit. ?   There is no height or weight on file to calculate BMI.    ECOG FS:0 - Asymptomatic ? ?General: Well-developed, well-nourished, no acute distress. ?HEENT: Normocephalic. ?Neuro: Alert, answering all questions appropriately. Cranial nerves grossly intact. ?Psych: Normal affect. ? ?LAB RESULTS: ? ?Lab Results  ?Component Value Date  ? NA 139 10/01/2021  ? K 4.2 10/01/2021  ? CL 100 10/01/2021  ? CO2 25 10/01/2021  ? GLUCOSE 104 (H) 10/01/2021  ? BUN 18 10/01/2021  ? CREATININE 1.05 (H) 10/01/2021  ? CALCIUM 10.3 10/01/2021  ? PROT 7.3 10/01/2021  ? ALBUMIN 4.4 10/01/2021  ? AST 23 10/01/2021  ? ALT 16 10/01/2021  ? ALKPHOS 42 (L) 10/01/2021  ? BILITOT 1.1 10/01/2021  ? GFRNONAA 55 (L) 09/20/2019  ? GFRAA 63 09/20/2019  ? ? ?Lab Results  ?Component Value Date  ? WBC 6.7 10/01/2021  ? NEUTROABS 3.0 10/01/2021  ? HGB 12.4  10/01/2021  ? HCT 38.0 10/01/2021  ? MCV 92 10/01/2021  ? PLT 401 10/01/2021  ? ? ? ?STUDIES: ?MM DIAG BREAST TOMO BILATERAL ? ?Result Date: 09/23/2021 ?CLINICAL DATA:  History of left breast cancer status post lumpectomy in April of 2022. EXAM: DIGITAL DIAGNOSTIC BILATERAL MAMMOGRAM WITH TOMOSYNTHESIS AND CAD TECHNIQUE: Bilateral digital diagnostic mammography and breast tomosynthesis was performed. The images were evaluated with computer-aided detection. COMPARISON:  Previous exam(s). ACR Breast Density Category b: There are scattered areas of fibroglandular density. FINDINGS: Lumpectomy changes in the left breast. No suspicious mass or malignant type microcalcifications in either breast. IMPRESSION: No evidence of malignancy in either breast. RECOMMENDATION: Bilateral diagnostic mammogram in 1 year is recommended. I have discussed the findings and recommendations with the patient. If applicable, a reminder letter will be sent to the patient regarding the next appointment. BI-RADS CATEGORY  2: Benign. Electronically Signed   By: Lillia Mountain M.D.   On: 09/23/2021 10:19   ? ? 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 ? ?PLAN:   ? ?1.  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

## 2021-10-15 ENCOUNTER — Inpatient Hospital Stay: Payer: Medicare Other | Attending: Oncology | Admitting: Oncology

## 2021-10-15 DIAGNOSIS — C50212 Malignant neoplasm of upper-inner quadrant of left female breast: Secondary | ICD-10-CM

## 2021-10-15 DIAGNOSIS — Z170 Estrogen receptor positive status [ER+]: Secondary | ICD-10-CM

## 2021-10-15 NOTE — Progress Notes (Signed)
Pt reports allergy/sinus issues due to pollen. No other concerns/complaints at this time. Pt also states she has recently gotten a new smart phone and has never had a mychart visit and is worried about being able to connect. Explained virtual visit process and pt verbalized understanding. States she will call the office if she has any connection issues.  ?

## 2021-10-16 ENCOUNTER — Other Ambulatory Visit: Payer: Self-pay | Admitting: Emergency Medicine

## 2021-10-16 DIAGNOSIS — Z79811 Long term (current) use of aromatase inhibitors: Secondary | ICD-10-CM

## 2021-12-08 DIAGNOSIS — R430 Anosmia: Secondary | ICD-10-CM | POA: Diagnosis not present

## 2021-12-08 DIAGNOSIS — H6123 Impacted cerumen, bilateral: Secondary | ICD-10-CM | POA: Diagnosis not present

## 2021-12-08 DIAGNOSIS — H903 Sensorineural hearing loss, bilateral: Secondary | ICD-10-CM | POA: Diagnosis not present

## 2021-12-09 DIAGNOSIS — H401431 Capsular glaucoma with pseudoexfoliation of lens, bilateral, mild stage: Secondary | ICD-10-CM | POA: Diagnosis not present

## 2021-12-09 DIAGNOSIS — H2513 Age-related nuclear cataract, bilateral: Secondary | ICD-10-CM | POA: Diagnosis not present

## 2022-01-06 ENCOUNTER — Encounter: Payer: Self-pay | Admitting: Internal Medicine

## 2022-01-06 ENCOUNTER — Ambulatory Visit (INDEPENDENT_AMBULATORY_CARE_PROVIDER_SITE_OTHER): Payer: Medicare Other | Admitting: Internal Medicine

## 2022-01-06 VITALS — BP 128/58 | HR 96 | Temp 98.9°F | Ht 65.0 in | Wt 169.0 lb

## 2022-01-06 DIAGNOSIS — U071 COVID-19: Secondary | ICD-10-CM | POA: Diagnosis not present

## 2022-01-06 DIAGNOSIS — R051 Acute cough: Secondary | ICD-10-CM | POA: Diagnosis not present

## 2022-01-06 LAB — POC COVID19 BINAXNOW: SARS Coronavirus 2 Ag: POSITIVE — AB

## 2022-01-06 MED ORDER — MOLNUPIRAVIR EUA 200MG CAPSULE: 4.0000 | ORAL_CAPSULE | Freq: Two times a day (BID) | ORAL | 0 refills | Status: AC

## 2022-01-06 MED ORDER — AZITHROMYCIN 250 MG PO TABS: ORAL_TABLET | ORAL | 0 refills | Status: AC

## 2022-01-06 MED ORDER — PROMETHAZINE-DM 6.25-15 MG/5ML PO SYRP: 5.0000 mL | ORAL_SOLUTION | Freq: Four times a day (QID) | ORAL | 0 refills | Status: AC | PRN

## 2022-01-06 NOTE — Patient Instructions (Signed)
Take Tylenol 650 - 1000 mg every 6-8 hours for fever, body aches and headache. Drink plenty of fluids with electrolytes. Monitor for fever that does not decrease and/or shortness of breath that worsens or is present at rest. Quarantine for 5 days.  

## 2022-01-06 NOTE — Progress Notes (Signed)
Date:  01/06/2022   Name:  Monique Hall   DOB:  17-May-1941   MRN:  [id number]   Chief Complaint: Cough (Headache and Low grade fever.)  Cough This is a new problem. Episode onset: 2 days ago. The problem has been unchanged. The cough is Productive of sputum. Associated symptoms include a fever (highest was 101), headaches, myalgias, nasal congestion, postnasal drip, rhinorrhea (yellow), shortness of breath and sweats. Pertinent negatives include no chest pain, chills, ear pain, sore throat or wheezing. Treatments tried: Tylenol and fluids.    Lab Results  Component Value Date   NA 139 10/01/2021   K 4.2 10/01/2021   CO2 25 10/01/2021   GLUCOSE 104 (H) 10/01/2021   BUN 18 10/01/2021   CREATININE 1.05 (H) 10/01/2021   CALCIUM 10.3 10/01/2021   EGFR 54 (L) 10/01/2021   GFRNONAA 55 (L) 09/20/2019   Lab Results  Component Value Date   CHOL 242 (H) 10/01/2021   HDL 54 10/01/2021   LDLCALC 158 (H) 10/01/2021   TRIG 167 (H) 10/01/2021   CHOLHDL 4.5 (H) 10/01/2021   Lab Results  Component Value Date   TSH 2.010 10/01/2021   No results found for: "HGBA1C" Lab Results  Component Value Date   WBC 6.7 10/01/2021   HGB 12.4 10/01/2021   HCT 38.0 10/01/2021   MCV 92 10/01/2021   PLT 401 10/01/2021   Lab Results  Component Value Date   ALT 16 10/01/2021   AST 23 10/01/2021   ALKPHOS 42 (L) 10/01/2021   BILITOT 1.1 10/01/2021   No results found for: "25OHVITD2", "25OHVITD3", "VD25OH"   Review of Systems  Constitutional:  Positive for diaphoresis, fatigue and fever (highest was 101). Negative for chills.  HENT:  Positive for postnasal drip and rhinorrhea (yellow). Negative for ear pain, sore throat and trouble swallowing.   Respiratory:  Positive for cough and shortness of breath. Negative for wheezing.   Cardiovascular:  Negative for chest pain.  Gastrointestinal:  Negative for diarrhea, nausea and vomiting.  Musculoskeletal:  Positive for myalgias.  Neurological:   Positive for headaches. Negative for dizziness and light-headedness.  Psychiatric/Behavioral:  Positive for sleep disturbance. Negative for dysphoric mood. The patient is not nervous/anxious.     Patient Active Problem List   Diagnosis Date Noted   Myalgia due to statin 10/01/2021   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 ([place]) 10/23/2020   Shoulder pain, bilateral 01/24/2020   Aortic atherosclerosis ([place]) 09/15/2018   Centrilobular emphysema ([place]) 09/15/2018   Thyroid nodule 10/06/2017   Gastroesophageal reflux disease without esophagitis 09/02/2016   Essential hypertension 07/29/2015   Mixed hyperlipidemia 07/29/2015   Menopausal symptoms 07/29/2015   Tobacco use disorder, moderate, in sustained remission 07/29/2015   Degeneration of intervertebral disc of lumbar region 06/17/2015   Neuritis 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 02/05/2015    Allergies  Allergen Reactions   Baclofen     Other reaction(s): Unknown   Statins Other (See Comments)    myalgia   Etodolac Nausea Only    Past Surgical History:  Procedure Laterality Date   ABDOMINAL HYSTERECTOMY  1985   total for bleeding   APPENDECTOMY     BIOPSY THYROID  2019   BLADDER SUSPENSION  2004   BREAST BIOPSY Left 10/09/2020   [place] Bx, Q-clip, IMC   BREAST LUMPECTOMY,RADIO FREQ LOCALIZER,AXILLARY SENTINEL LYMPH NODE BIOPSY Left 11/06/2020   INVASIVE MAMMARY CARCINOMA, NO SPECIAL TYPE BREAST [place]  LYMPH NODE BIOPSY;  Surgeon: Benjamine Sprague, DO;  Location: [facility] ORS;  Service: General;  Laterality: Left;   lipoma excision  03/2018   posterior neck    Social History   Tobacco Use   Smoking status: Former    Packs/day: 1.00    Years: 35.00    Total pack years: 35.00    Types: Cigarettes    Quit date: 09/01/2002    Years since quitting: 19.3   Smokeless tobacco: Never   Tobacco comments:    smoking cessation materials not required   Vaping Use   Vaping Use: Never used  Substance Use Topics   Alcohol use: Yes    Alcohol/week: 0.0 standard drinks of alcohol    Comment: RARE   Drug use: Never     Medication list has been reviewed and updated.  Current Meds  Medication Sig   acetaminophen (TYLENOL) 500 MG tablet Take 1 tablet (500 mg total) by mouth every 6 (six) hours as needed for mild pain or moderate pain. For shoulder pain   aspirin EC 81 MG tablet Take 81 mg by mouth daily.   Black Elderberry (SAMBUCUS ELDERBERRY PO) Take 1 each by mouth daily. gummy   calcium-vitamin D (OSCAL WITH D) 500-200 MG-UNIT tablet Take 1 tablet by mouth daily.   ezetimibe (ZETIA) 10 MG tablet TAKE 1 TABLET EVERY DAY   ibuprofen (ADVIL) 200 MG tablet Take 400 mg by mouth every 8 (eight) hours as needed for moderate pain.   letrozole (FEMARA) 2.5 MG tablet TAKE 1 TABLET EVERY DAY   loratadine (CLARITIN) 10 MG tablet Take 10 mg by mouth daily.   losartan-hydrochlorothiazide (HYZAAR) 100-12.5 MG tablet TAKE 1 TABLET EVERY DAY   triamcinolone (NASACORT) 55 MCG/ACT AERO nasal inhaler Place 1 spray into the nose at bedtime as needed (allergies).       10/01/2021    8:56 AM 04/02/2021    9:22 AM 09/25/2020    8:11 AM  GAD 7 : Generalized Anxiety Score  Nervous, Anxious, on Edge 0 0 0  Control/stop worrying 0 0 0  Worry too much - different things 0 0 0  Trouble relaxing 0 0 0  Restless 0 0 0  Easily annoyed or irritable 0 0 0  Afraid - awful might happen 0 0 0  Total GAD 7 Score 0 0 0  Anxiety Difficulty Not difficult at all         10/01/2021    8:56 AM  Depression screen PHQ 2/9  Decreased Interest 0  Down, Depressed, Hopeless 0  PHQ - 2 Score 0  Altered sleeping 0  Tired, decreased energy 0  Change in appetite 0  Feeling bad or failure about yourself  0  Trouble concentrating 0  Moving slowly or fidgety/restless 0  Suicidal thoughts 0  PHQ-9 Score 0  Difficult doing work/chores Not difficult at all    BP Readings  from Last 3 Encounters:  01/06/22 (!) 128/58  10/01/21 110/64  07/02/21 (!) 160/79    Physical Exam Constitutional:      Appearance: She is ill-appearing.  Cardiovascular:     Rate and Rhythm: Normal rate and regular rhythm.     Pulses: Normal pulses.  Pulmonary:     Effort: Pulmonary effort is normal.     Breath sounds: Normal breath sounds. No wheezing or rhonchi.  Musculoskeletal:     Cervical back: Normal range of motion.     Right lower leg: No edema.     Left  lower leg: No edema.  Lymphadenopathy:     Cervical: No cervical adenopathy.  Neurological:     Mental Status: She is alert.     Wt Readings from Last 3 Encounters:  01/06/22 169 lb (76.7 kg)  10/01/21 170 lb (77.1 kg)  07/02/21 171 lb 3.2 oz (77.7 kg)    BP (!) 128/58   Pulse 96   Temp 98.9 F (37.2 C) (Oral)   Ht 5' 5"  (1.651 m)   Wt 169 lb (76.7 kg)   SpO2 96%   BMI 28.12 kg/m   Assessment and Plan: 1. COVID-19 virus infection Recommend anti-viral therapy due to moderate risk of complications Take Tylenol 650 - 1000 mg every 6-8 hours for fever, body aches and headache. Drink plenty of fluids with electrolytes. Monitor for fever that does not decrease and/or shortness of breath that worsens or is present at rest. Quarantine for 5 days. - molnupiravir EUA (LAGEVRIO) 200 mg CAPS capsule; Take 4 capsules (800 mg total) by mouth 2 (two) times daily for 5 days.  Dispense: 40 capsule; Refill: 0  2. Acute cough Will treat for early bacterial complication with Zpak - POC COVID-19 BinaxNow - positive - azithromycin (ZITHROMAX Z-PAK) 250 MG tablet; UAD  Dispense: 6 each; Refill: 0 - promethazine-dextromethorphan (PROMETHAZINE-DM) 6.25-15 MG/5ML syrup; Take 5 mLs by mouth 4 (four) times daily as needed for up to 9 days for cough.  Dispense: 118 mL; Refill: 0   Partially dictated using Editor, commissioning. Any errors are unintentional.  Halina Maidens, MD [facility]  01/06/2022

## 2022-01-07 ENCOUNTER — Ambulatory Visit: Payer: Medicare Other | Admitting: Radiation Oncology

## 2022-01-14 ENCOUNTER — Ambulatory Visit
Admission: RE | Admit: 2022-01-14 | Discharge: 2022-01-14 | Disposition: A | Discharge status: Home / Self Care | Payer: Medicare Other | Source: Ambulatory Visit | Attending: Radiation Oncology | Admitting: Radiation Oncology

## 2022-01-14 ENCOUNTER — Ambulatory Visit
Admission: RE | Admit: 2022-01-14 | Discharge: 2022-01-14 | Disposition: A | Discharge status: Home / Self Care | Payer: Medicare Other | Source: Ambulatory Visit | Attending: Oncology | Admitting: Oncology

## 2022-01-14 VITALS — BP 154/86 | HR 85 | Temp 97.6°F | Resp 18 | Ht 65.0 in | Wt 170.7 lb

## 2022-01-14 DIAGNOSIS — Z13820 Encounter for screening for osteoporosis: Secondary | ICD-10-CM | POA: Diagnosis not present

## 2022-01-14 DIAGNOSIS — Z08 Encounter for follow-up examination after completed treatment for malignant neoplasm: Secondary | ICD-10-CM | POA: Diagnosis not present

## 2022-01-14 DIAGNOSIS — C50212 Malignant neoplasm of upper-inner quadrant of left female breast: Secondary | ICD-10-CM | POA: Diagnosis not present

## 2022-01-14 DIAGNOSIS — Z923 Personal history of irradiation: Secondary | ICD-10-CM | POA: Diagnosis not present

## 2022-01-14 DIAGNOSIS — Z780 Asymptomatic menopausal state: Secondary | ICD-10-CM | POA: Insufficient documentation

## 2022-01-14 DIAGNOSIS — Z79811 Long term (current) use of aromatase inhibitors: Secondary | ICD-10-CM | POA: Insufficient documentation

## 2022-01-14 DIAGNOSIS — Z853 Personal history of malignant neoplasm of breast: Secondary | ICD-10-CM | POA: Insufficient documentation

## 2022-01-14 DIAGNOSIS — M85852 Other specified disorders of bone density and structure, left thigh: Secondary | ICD-10-CM | POA: Diagnosis not present

## 2022-01-14 NOTE — Progress Notes (Signed)
Radiation Oncology Follow up Note  Name: Monique Hall   Date:   01/14/2022 MRN:  [id number] DOB: 03-17-1941    This 81 y.o. female presents to the clinic today for 1 year follow-up status post whole breast radiation to her left breast for stage Ia ER/PR positive invasive mammary carcinoma.  REFERRING PROVIDER: Glean Hess, MD  HPI: Patient is an 81 year old female now out 1 year having completed whole breast radiation to her left breast for stage Ia ER/PR positive invasive mammary carcinoma.  Seen today in routine follow-up she is doing well.  She specifically denies breast tenderness cough or bone pain..  She had mammograms back in March which I have reviewed were BI-RADS 2 benign.  She is currently on Femara tolerating it well without side effect.  COMPLICATIONS OF TREATMENT: none  FOLLOW UP COMPLIANCE: keeps appointments   PHYSICAL EXAM:  BP (!) 154/86   Pulse 85   Temp 97.6 F (36.4 C)   Resp 18   Ht '5\' 5"'$  (1.651 m)   Wt 170 lb 11.2 oz (77.4 kg)   BMI 28.41 kg/m  Lungs are clear to A&P cardiac examination essentially unremarkable with regular rate and rhythm. No dominant mass or nodularity is noted in either breast in 2 positions examined. Incision is well-healed. No axillary or supraclavicular adenopathy is appreciated. Cosmetic result is excellent.  Well-developed well-nourished patient in NAD. HEENT reveals PERLA, EOMI, discs not visualized.  Oral cavity is clear. No oral mucosal lesions are identified. Neck is clear without evidence of cervical or supraclavicular adenopathy. Lungs are clear to A&P. Cardiac examination is essentially unremarkable with regular rate and rhythm without murmur rub or thrill. Abdomen is benign with no organomegaly or masses noted. Motor sensory and DTR levels are equal and symmetric in the upper and lower extremities. Cranial nerves II through XII are grossly intact. Proprioception is intact. No peripheral adenopathy or edema is identified. No motor  or sensory levels are noted. Crude visual fields are within normal range.  RADIOLOGY RESULTS: Mammograms reviewed compatible with above-stated findings  PLAN: Present time patient is doing well 1 year out with no evidence of disease I am pleased with her overall progress.  Of asked to see her back in 1 year for follow-up.  She continues on Femara without side effect.  Patient is to call with any concerns.  I would like to take this opportunity to thank you for allowing me to participate in the care of your patient.Noreene Filbert, MD

## 2022-01-25 ENCOUNTER — Ambulatory Visit: Payer: Medicare Other | Admitting: Radiation Oncology

## 2022-03-31 ENCOUNTER — Encounter: Payer: Self-pay | Admitting: Internal Medicine

## 2022-04-02 ENCOUNTER — Encounter: Payer: Self-pay | Admitting: Internal Medicine

## 2022-04-02 ENCOUNTER — Ambulatory Visit (INDEPENDENT_AMBULATORY_CARE_PROVIDER_SITE_OTHER): Payer: Medicare Other | Admitting: Internal Medicine

## 2022-04-02 VITALS — BP 126/58 | HR 86 | Ht 65.0 in | Wt 172.0 lb

## 2022-04-02 DIAGNOSIS — I10 Essential (primary) hypertension: Secondary | ICD-10-CM

## 2022-04-02 DIAGNOSIS — J432 Centrilobular emphysema: Secondary | ICD-10-CM

## 2022-04-02 DIAGNOSIS — Z23 Encounter for immunization: Secondary | ICD-10-CM

## 2022-04-02 NOTE — Progress Notes (Signed)
Date:  04/02/2022   Name:  Monique Hall   DOB:  [birth year]   MRN:  [id number]   Chief Complaint: Hypertension  Hypertension This is a chronic problem. The problem is controlled. Associated symptoms include shortness of breath (only with exertion). Pertinent negatives include no chest pain, headaches or palpitations. Past treatments include angiotensin blockers and diuretics. The current treatment provides significant improvement.    Lab Results  Component Value Date   NA 139 10/01/2021   K 4.2 10/01/2021   CO2 25 10/01/2021   GLUCOSE 104 (H) 10/01/2021   BUN 18 10/01/2021   CREATININE 1.05 (H) 10/01/2021   CALCIUM 10.3 10/01/2021   EGFR 54 (L) 10/01/2021   GFRNONAA 55 (L) 09/20/2019   Lab Results  Component Value Date   CHOL 242 (H) 10/01/2021   HDL 54 10/01/2021   LDLCALC 158 (H) 10/01/2021   TRIG 167 (H) 10/01/2021   CHOLHDL 4.5 (H) 10/01/2021   Lab Results  Component Value Date   TSH 2.010 10/01/2021   No results found for: "HGBA1C" Lab Results  Component Value Date   WBC 6.7 10/01/2021   HGB 12.4 10/01/2021   HCT 38.0 10/01/2021   MCV 92 10/01/2021   PLT 401 10/01/2021   Lab Results  Component Value Date   ALT 16 10/01/2021   AST 23 10/01/2021   ALKPHOS 42 (L) 10/01/2021   BILITOT 1.1 10/01/2021   No results found for: "25OHVITD2", "25OHVITD3", "VD25OH"   Review of Systems  Constitutional:  Negative for chills, fatigue and unexpected weight change.  HENT:  Negative for nosebleeds.   Eyes:  Negative for visual disturbance.  Respiratory:  Positive for shortness of breath (only with exertion). Negative for cough, chest tightness and wheezing.   Cardiovascular:  Negative for chest pain, palpitations and leg swelling.  Gastrointestinal:  Negative for abdominal pain, constipation and diarrhea.  Musculoskeletal:  Positive for back pain.  Neurological:  Negative for dizziness, weakness, light-headedness and headaches.  Psychiatric/Behavioral:  Negative for  dysphoric mood and sleep disturbance. The patient is not nervous/anxious.     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[place]) 10/23/2020   Shoulder pain, bilateral 01/24/2020   Aortic atherosclerosis ([place]) 09/15/2018   Centrilobular emphysema ([place]) 09/15/2018   Thyroid nodule 10/06/2017   Gastroesophageal reflux disease without esophagitis 09/02/2016   Essential hypertension 07/29/2015   Mixed hyperlipidemia 07/29/2015   Menopausal symptoms 07/29/2015   Tobacco use disorder, moderate, in sustained remission 07/29/2015   Degeneration of intervertebral disc of lumbar region 06/17/2015   Neuritis 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 02/05/2015    Allergies  Allergen Reactions   Baclofen     Other reaction(s): Unknown   Statins Other (See Comments)    myalgia   Etodolac Nausea Only    Past Surgical History:  Procedure Laterality Date   ABDOMINAL HYSTERECTOMY  1985   total for bleeding   APPENDECTOMY     BIOPSY THYROID  2019   BLADDER SUSPENSION  2004   BREAST BIOPSY Left 10/09/2020   [place] Bx, Q-clip, IMC   BREAST LUMPECTOMY,RADIO FREQ LOCALIZER,AXILLARY SENTINEL LYMPH NODE BIOPSY Left 11/06/2020   INVASIVE MAMMARY CARCINOMA, NO SPECIAL TYPE BREAST LUMPECTOMY,RADIO FREQ LOCALIZER,AXILLARY SENTINEL LYMPH NODE BIOPSY;  Surgeon: Benjamine Sprague, DO;  Location: [facility] ORS;  Service: General;  Laterality: Left;   lipoma excision  03/2018   posterior neck  Social History   Tobacco Use   Smoking status: Former    Packs/day: 1.00    Years: 35.00    Total pack years: 35.00    Types: Cigarettes    Quit date: 09/01/2002    Years since quitting: 19.5   Smokeless tobacco: Never   Tobacco comments:    smoking cessation materials not required  Vaping Use   Vaping Use: Never used  Substance Use Topics   Alcohol use: Yes    Alcohol/week: 0.0 standard drinks of  alcohol    Comment: RARE   Drug use: Never     Medication list has been reviewed and updated.  Current Meds  Medication Sig   acetaminophen (TYLENOL) 500 MG tablet Take 1 tablet (500 mg total) by mouth every 6 (six) hours as needed for mild pain or moderate pain. For shoulder pain   aspirin EC 81 MG tablet Take 81 mg by mouth daily.   Black Elderberry (SAMBUCUS ELDERBERRY PO) Take 1 each by mouth daily. gummy   calcium-vitamin D (OSCAL WITH D) 500-200 MG-UNIT tablet Take 1 tablet by mouth daily.   ezetimibe (ZETIA) 10 MG tablet TAKE 1 TABLET EVERY DAY   ibuprofen (ADVIL) 200 MG tablet Take 400 mg by mouth every 8 (eight) hours as needed for moderate pain.   letrozole (FEMARA) 2.5 MG tablet TAKE 1 TABLET EVERY DAY   loratadine (CLARITIN) 10 MG tablet Take 10 mg by mouth daily.   losartan-hydrochlorothiazide (HYZAAR) 100-12.5 MG tablet TAKE 1 TABLET EVERY DAY   triamcinolone (NASACORT) 55 MCG/ACT AERO nasal inhaler Place 1 spray into the nose at bedtime as needed (allergies).       04/02/2022    9:41 AM 10/01/2021    8:56 AM 04/02/2021    9:22 AM 09/25/2020    8:11 AM  GAD 7 : Generalized Anxiety Score  Nervous, Anxious, on Edge 0 0 0 0  Control/stop worrying 0 0 0 0  Worry too much - different things 0 0 0 0  Trouble relaxing 0 0 0 0  Restless 0 0 0 0  Easily annoyed or irritable 0 0 0 0  Afraid - awful might happen 0 0 0 0  Total GAD 7 Score 0 0 0 0  Anxiety Difficulty Not difficult at all Not difficult at all         04/02/2022    9:41 AM 10/01/2021    8:56 AM 09/28/2021    3:30 PM  Depression screen PHQ 2/9  Decreased Interest 0 0 0  Down, Depressed, Hopeless 0 0 0  PHQ - 2 Score 0 0 0  Altered sleeping 1 0   Tired, decreased energy 0 0   Change in appetite 0 0   Feeling bad or failure about yourself  0 0   Trouble concentrating 0 0   Moving slowly or fidgety/restless 0 0   Suicidal thoughts 0 0   PHQ-9 Score 1 0   Difficult doing work/chores Not difficult at all  Not difficult at all     BP Readings from Last 3 Encounters:  04/02/22 (!) 126/58  01/14/22 (!) 154/86  01/06/22 (!) 128/58    Physical Exam Vitals and nursing note reviewed.  Constitutional:      General: She is not in acute distress.    Appearance: Normal appearance. She is well-developed.  HENT:     Head: Normocephalic and atraumatic.  Neck:     Vascular: No carotid bruit.  Cardiovascular:  Rate and Rhythm: Normal rate and regular rhythm.     Pulses: Normal pulses.  Pulmonary:     Effort: Pulmonary effort is normal. No respiratory distress.     Breath sounds: No wheezing or rhonchi.  Musculoskeletal:     Cervical back: Normal range of motion.     Right lower leg: No edema.     Left lower leg: No edema.  Lymphadenopathy:     Cervical: No cervical adenopathy.  Skin:    General: Skin is warm and dry.     Capillary Refill: Capillary refill takes less than 2 seconds.     Findings: No rash.  Neurological:     General: No focal deficit present.     Mental Status: She is alert and oriented to person, place, and time.  Psychiatric:        Mood and Affect: Mood normal.        Behavior: Behavior normal.     Wt Readings from Last 3 Encounters:  04/02/22 172 lb (78 kg)  01/14/22 170 lb 11.2 oz (77.4 kg)  01/06/22 169 lb (76.7 kg)    BP (!) 126/58   Pulse 86   Ht _0  (1.651 m)   Wt 172 lb (78 kg)   SpO2 97%   BMI 28.62 kg/m   Assessment and Plan: 1. Essential hypertension Clinically stable exam with well controlled BP. Tolerating medications without side effects at this time. Pt to continue current regimen and low sodium diet; benefits of regular exercise as able discussed.  2. Centrilobular emphysema (HCC) Mild shortness of breath with exertion but otherwise doing well. Not currently using any inhalers.   Partially dictated using Editor, commissioning. Any errors are unintentional.  Halina Maidens, MD [facility]  04/02/2022

## 2022-04-05 ENCOUNTER — Other Ambulatory Visit: Payer: Self-pay | Admitting: Oncology

## 2022-07-01 ENCOUNTER — Other Ambulatory Visit: Payer: Self-pay | Admitting: Internal Medicine

## 2022-07-01 DIAGNOSIS — I10 Essential (primary) hypertension: Secondary | ICD-10-CM

## 2022-07-01 DIAGNOSIS — E782 Mixed hyperlipidemia: Secondary | ICD-10-CM

## 2022-07-20 ENCOUNTER — Telehealth: Payer: Self-pay | Admitting: Internal Medicine

## 2022-07-20 NOTE — Telephone Encounter (Signed)
Pt is calling to request an order for [facility] for a mammogram.  Please advise

## 2022-08-19 DIAGNOSIS — Z789 Other specified health status: Secondary | ICD-10-CM | POA: Diagnosis not present

## 2022-08-19 DIAGNOSIS — I2510 Atherosclerotic heart disease of native coronary artery without angina pectoris: Secondary | ICD-10-CM | POA: Diagnosis not present

## 2022-08-19 DIAGNOSIS — I10 Essential (primary) hypertension: Secondary | ICD-10-CM | POA: Diagnosis not present

## 2022-08-19 DIAGNOSIS — I700 Atherosclerosis of aorta: Secondary | ICD-10-CM | POA: Diagnosis not present

## 2022-08-19 DIAGNOSIS — Z87891 Personal history of nicotine dependence: Secondary | ICD-10-CM | POA: Diagnosis not present

## 2022-08-19 DIAGNOSIS — E782 Mixed hyperlipidemia: Secondary | ICD-10-CM | POA: Diagnosis not present

## 2022-09-15 ENCOUNTER — Telehealth: Payer: Self-pay | Admitting: Internal Medicine

## 2022-09-15 NOTE — Telephone Encounter (Signed)
Contacted [name] to schedule their annual wellness visit. Appointment made for 10/07/2022.  Sherol Dade; [facility] Direct Dial: [phone]

## 2022-10-04 ENCOUNTER — Ambulatory Visit: Payer: Medicare Other

## 2022-10-07 ENCOUNTER — Ambulatory Visit (INDEPENDENT_AMBULATORY_CARE_PROVIDER_SITE_OTHER): Payer: Medicare Other

## 2022-10-07 ENCOUNTER — Encounter: Payer: Self-pay | Admitting: Internal Medicine

## 2022-10-07 ENCOUNTER — Ambulatory Visit (INDEPENDENT_AMBULATORY_CARE_PROVIDER_SITE_OTHER): Payer: Medicare Other | Admitting: Internal Medicine

## 2022-10-07 VITALS — BP 128/76 | HR 76 | Ht 65.0 in | Wt 174.6 lb

## 2022-10-07 VITALS — BP 128/76 | Ht 65.0 in | Wt 174.6 lb

## 2022-10-07 DIAGNOSIS — C50212 Malignant neoplasm of upper-inner quadrant of left female breast: Secondary | ICD-10-CM | POA: Diagnosis not present

## 2022-10-07 DIAGNOSIS — Z170 Estrogen receptor positive status [ER+]: Secondary | ICD-10-CM | POA: Diagnosis not present

## 2022-10-07 DIAGNOSIS — I10 Essential (primary) hypertension: Secondary | ICD-10-CM

## 2022-10-07 DIAGNOSIS — E782 Mixed hyperlipidemia: Secondary | ICD-10-CM

## 2022-10-07 DIAGNOSIS — J432 Centrilobular emphysema: Secondary | ICD-10-CM

## 2022-10-07 DIAGNOSIS — R739 Hyperglycemia, unspecified: Secondary | ICD-10-CM | POA: Diagnosis not present

## 2022-10-07 DIAGNOSIS — I700 Atherosclerosis of aorta: Secondary | ICD-10-CM | POA: Diagnosis not present

## 2022-10-07 DIAGNOSIS — Z1231 Encounter for screening mammogram for malignant neoplasm of breast: Secondary | ICD-10-CM

## 2022-10-07 DIAGNOSIS — Z0000 Encounter for general adult medical examination without abnormal findings: Secondary | ICD-10-CM | POA: Diagnosis not present

## 2022-10-07 DIAGNOSIS — E041 Nontoxic single thyroid nodule: Secondary | ICD-10-CM | POA: Diagnosis not present

## 2022-10-07 NOTE — Assessment & Plan Note (Signed)
No compressive symptoms noted Will check labs annually

## 2022-10-07 NOTE — Progress Notes (Signed)
Date:  10/07/2022   Name:  Monique Hall   DOB:  10-18-1940   MRN:  [id number]   Chief Complaint: Annual Exam Monique Hall is a 82 y.o. female who presents today for her Complete Annual Exam. She feels well. She reports exercising some. She reports she is sleeping well. Breast complaints - none.  Mammogram: 09/2021 DEXA: 12/2021 Normal spine/osteopenia hip Pap smear: discontinued Colonoscopy: 2012  Health Maintenance Due  Topic Date Due   Zoster Vaccines- Shingrix (1 of 2) Never done   COVID-19 Vaccine (3 - Pfizer risk series) 05/29/2020   MAMMOGRAM  09/24/2022    Immunization History  Administered Date(s) Administered   Fluad Quad(high Dose 65+) 03/27/2020, 04/21/2021, 04/02/2022   Influenza-Unspecified 04/28/2017, 05/11/2018, 05/11/2019   PFIZER(Purple Top)SARS-COV-2 Vaccination 04/10/2020, 05/01/2020   Pneumococcal Conjugate-13 06/24/2014   Pneumococcal Polysaccharide-23 07/21/2011   Tdap 07/20/2005, 08/29/2015   Zoster, Live 07/20/2000    Hypertension This is a chronic problem. The problem is controlled. Pertinent negatives include no chest pain, headaches, palpitations or shortness of breath. Past treatments include angiotensin blockers and diuretics.  Hyperlipidemia This is a chronic problem. The problem is uncontrolled. Pertinent negatives include no chest pain or shortness of breath. Current antihyperlipidemic treatment includes ezetimibe. The current treatment provides mild improvement of lipids.    Lab Results  Component Value Date   NA 139 10/01/2021   K 4.2 10/01/2021   CO2 25 10/01/2021   GLUCOSE 104 (H) 10/01/2021   BUN 18 10/01/2021   CREATININE 1.05 (H) 10/01/2021   CALCIUM 10.3 10/01/2021   EGFR 54 (L) 10/01/2021   GFRNONAA 55 (L) 09/20/2019   Lab Results  Component Value Date   CHOL 242 (H) 10/01/2021   HDL 54 10/01/2021   LDLCALC 158 (H) 10/01/2021   TRIG 167 (H) 10/01/2021   CHOLHDL 4.5 (H) 10/01/2021   Lab Results  Component Value Date    TSH 2.010 10/01/2021   No results found for: "HGBA1C" Lab Results  Component Value Date   WBC 6.7 10/01/2021   HGB 12.4 10/01/2021   HCT 38.0 10/01/2021   MCV 92 10/01/2021   PLT 401 10/01/2021   Lab Results  Component Value Date   ALT 16 10/01/2021   AST 23 10/01/2021   ALKPHOS 42 (L) 10/01/2021   BILITOT 1.1 10/01/2021   No results found for: "25OHVITD2", "25OHVITD3", "VD25OH"   Review of Systems  Constitutional:  Negative for chills, fatigue and fever.  HENT:  Positive for hearing loss. Negative for congestion, tinnitus, trouble swallowing and voice change.   Eyes:  Negative for visual disturbance.  Respiratory:  Negative for cough, chest tightness, shortness of breath and wheezing.   Cardiovascular:  Negative for chest pain, palpitations and leg swelling.  Gastrointestinal:  Negative for abdominal pain, constipation, diarrhea and vomiting.  Endocrine: Negative for polydipsia and polyuria.  Genitourinary:  Negative for dysuria, frequency, genital sores, vaginal bleeding and vaginal discharge.  Musculoskeletal:  Negative for arthralgias, gait problem and joint swelling.  Skin:  Negative for color change and rash.  Neurological:  Negative for dizziness, tremors, light-headedness and headaches.  Hematological:  Negative for adenopathy. Does not bruise/bleed easily.  Psychiatric/Behavioral:  Negative for dysphoric mood and sleep disturbance. The patient is not nervous/anxious.     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[place]) 10/23/2020   Shoulder pain, bilateral 01/24/2020   Aortic atherosclerosis ([place])  09/15/2018   Centrilobular emphysema ([place]) 09/15/2018   Thyroid nodule 10/06/2017   Gastroesophageal reflux disease without esophagitis 09/02/2016   Essential hypertension 07/29/2015   Mixed hyperlipidemia 07/29/2015   Menopausal symptoms 07/29/2015   Tobacco use  disorder, moderate, in sustained remission 07/29/2015   Degeneration of intervertebral disc of lumbar region 06/17/2015   Neuritis 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 02/05/2015    Allergies  Allergen Reactions   Baclofen     Other reaction(s): Unknown   Statins Other (See Comments)    myalgia   Etodolac Nausea Only    Past Surgical History:  Procedure Laterality Date   ABDOMINAL HYSTERECTOMY  1985   total for bleeding   APPENDECTOMY     BIOPSY THYROID  2019   BLADDER SUSPENSION  2004   BREAST BIOPSY Left 10/09/2020   [place] Bx, Q-clip, IMC   BREAST LUMPECTOMY,RADIO FREQ LOCALIZER,AXILLARY SENTINEL LYMPH NODE BIOPSY Left 11/06/2020   INVASIVE MAMMARY CARCINOMA, NO SPECIAL TYPE BREAST LUMPECTOMY,RADIO FREQ LOCALIZER,AXILLARY SENTINEL LYMPH NODE BIOPSY;  Surgeon: Benjamine Sprague, DO;  Location: [facility] ORS;  Service: General;  Laterality: Left;   lipoma excision  03/2018   posterior neck    Social History   Tobacco Use   Smoking status: Former    Packs/day: 1.00    Years: 35.00    Additional pack years: 0.00    Total pack years: 35.00    Types: Cigarettes    Quit date: 09/01/2002    Years since quitting: 20.1   Smokeless tobacco: Never   Tobacco comments:    smoking cessation materials not required  Vaping Use   Vaping Use: Never used  Substance Use Topics   Alcohol use: Yes    Alcohol/week: 0.0 standard drinks of alcohol    Comment: RARE   Drug use: Never     Medication list has been reviewed and updated.  Current Meds  Medication Sig   acetaminophen (TYLENOL) 500 MG tablet Take 1 tablet (500 mg total) by mouth every 6 (six) hours as needed for mild pain or moderate pain. For shoulder pain   aspirin EC 81 MG tablet Take 81 mg by mouth daily.   Black Elderberry (SAMBUCUS ELDERBERRY PO) Take 1 each by mouth daily. gummy   calcium-vitamin D (OSCAL WITH D) 500-200 MG-UNIT tablet Take 1 tablet by mouth daily.   ezetimibe (ZETIA) 10 MG tablet TAKE 1  TABLET EVERY DAY   ibuprofen (ADVIL) 200 MG tablet Take 400 mg by mouth every 8 (eight) hours as needed for moderate pain.   letrozole (FEMARA) 2.5 MG tablet TAKE 1 TABLET EVERY DAY   loratadine (CLARITIN) 10 MG tablet Take 10 mg by mouth daily.   losartan-hydrochlorothiazide (HYZAAR) 100-12.5 MG tablet TAKE 1 TABLET EVERY DAY   Multiple Vitamin (MULTIVITAMIN) tablet Take 1 tablet by mouth daily.   triamcinolone (NASACORT) 55 MCG/ACT AERO nasal inhaler Place 1 spray into the nose at bedtime as needed (allergies).       04/02/2022    9:41 AM 10/01/2021    8:56 AM 04/02/2021    9:22 AM 09/25/2020    8:11 AM  GAD 7 : Generalized Anxiety Score  Nervous, Anxious, on Edge 0 0 0 0  Control/stop worrying 0 0 0 0  Worry too much - different things 0 0 0 0  Trouble relaxing 0 0 0 0  Restless 0 0 0 0  Easily annoyed or irritable 0 0 0 0  Afraid - awful might happen 0  0 0 0  Total GAD 7 Score 0 0 0 0  Anxiety Difficulty Not difficult at all Not difficult at all         10/07/2022    8:46 AM 04/02/2022    9:41 AM 10/01/2021    8:56 AM  Depression screen PHQ 2/9  Decreased Interest 0 0 0  Down, Depressed, Hopeless 0 0 0  PHQ - 2 Score 0 0 0  Altered sleeping 0 1 0  Tired, decreased energy 0 0 0  Change in appetite 0 0 0  Feeling bad or failure about yourself  0 0 0  Trouble concentrating 0 0 0  Moving slowly or fidgety/restless 0 0 0  Suicidal thoughts 0 0 0  PHQ-9 Score 0 1 0  Difficult doing work/chores Not difficult at all Not difficult at all Not difficult at all    BP Readings from Last 3 Encounters:  10/07/22 128/76  10/07/22 128/76  04/02/22 (!) 126/58    Physical Exam Vitals and nursing note reviewed.  Constitutional:      General: She is not in acute distress.    Appearance: She is well-developed.  HENT:     Head: Normocephalic and atraumatic.     Right Ear: Tympanic membrane and ear canal normal.     Left Ear: Tympanic membrane and ear canal normal.     Nose:      Right Sinus: No maxillary sinus tenderness.     Left Sinus: No maxillary sinus tenderness.  Eyes:     General: No scleral icterus.       Right eye: No discharge.        Left eye: No discharge.     Conjunctiva/sclera: Conjunctivae normal.  Neck:     Thyroid: No thyromegaly.     Vascular: No carotid bruit.  Cardiovascular:     Rate and Rhythm: Normal rate and regular rhythm.     Pulses: Normal pulses.     Heart sounds: Normal heart sounds.  Pulmonary:     Effort: Pulmonary effort is normal. No respiratory distress.     Breath sounds: No wheezing.  Chest:  Breasts:    Right: No mass, nipple discharge, skin change or tenderness.     Left: No mass, nipple discharge, skin change or tenderness.       Comments: Surgical scars healed Abdominal:     General: Bowel sounds are normal.     Palpations: Abdomen is soft.     Tenderness: There is no abdominal tenderness.  Musculoskeletal:     Cervical back: Normal range of motion. No erythema.     Right lower leg: No edema.     Left lower leg: No edema.  Lymphadenopathy:     Cervical: No cervical adenopathy.  Skin:    General: Skin is warm and dry.     Findings: No rash.  Neurological:     Mental Status: She is alert and oriented to person, place, and time.     Cranial Nerves: No cranial nerve deficit.     Sensory: No sensory deficit.     Deep Tendon Reflexes: Reflexes are normal and symmetric.  Psychiatric:        Attention and Perception: Attention normal.        Mood and Affect: Mood normal.     Wt Readings from Last 3 Encounters:  10/07/22 174 lb 9.6 oz (79.2 kg)  10/07/22 174 lb 9.6 oz (79.2 kg)  04/02/22 172 lb (78 kg)  BP 128/76   Pulse 76   Ht 5\' 5"  (1.651 m)   Wt 174 lb 9.6 oz (79.2 kg)   SpO2 97%   BMI 29.05 kg/m   Assessment and Plan:  Problem List Items Addressed This Visit       Cardiovascular and Mediastinum   Essential hypertension - Primary (Chronic)    Clinically stable exam with well controlled  BP on hyzaar. Tolerating medications without side effects. Pt to continue current regimen and low sodium diet.       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  Intolerant of many statins. LDL is  Lab Results  Component Value Date   LDLCALC 158 (H) 10/01/2021         Relevant Orders   Lipid panel   Other Visit Diagnoses     Elevated serum glucose       Relevant Orders   Hemoglobin A1c       Return in about 6 months (around 04/09/2023) for HTN.   Partially dictated using [place], any errors are not intentional.  Glean Hess, MD [place], [place]

## 2022-10-07 NOTE — Assessment & Plan Note (Signed)
Tolerating Zetia  without concerns.  Intolerant of many statins. LDL is  Lab Results  Component Value Date   LDLCALC 158 (H) 10/01/2021

## 2022-10-07 NOTE — Patient Instructions (Signed)
Monique Hall , Thank you for taking time to come for your Medicare Wellness Visit. I appreciate your ongoing commitment to your health goals. Please review the following plan we discussed and let me know if I can assist you in the future.   These are the goals we discussed:  Goals      DIET - EAT MORE FRUITS AND VEGETABLES     DIET - INCREASE WATER INTAKE     Recommend to drink at least 6-8 8oz glasses of water per day.        This is a list of the screening recommended for you and due dates:  Health Maintenance  Topic Date Due   Zoster (Shingles) Vaccine (1 of 2) Never done   COVID-19 Vaccine (3 - Pfizer risk series) 05/29/2020   Mammogram  09/24/2022   Medicare Annual Wellness Visit  10/07/2023   DTaP/Tdap/Td vaccine (3 - Td or Tdap) 08/28/2025   Pneumonia Vaccine  Completed   Flu Shot  Completed   DEXA scan (bone density measurement)  Completed   HPV Vaccine  Aged Out    Advanced directives: no  Conditions/risks identified: none  Next appointment: Follow up in one year for your annual wellness visit 10/11/21 @ 3:00 pm in person   Preventive Care 65 Years and Older, Female Preventive care refers to lifestyle choices and visits with your health care provider that can promote health and wellness. What does preventive care include? A yearly physical exam. This is also called an annual well check. Dental exams once or twice a year. Routine eye exams. Ask your health care provider how often you should have your eyes checked. Personal lifestyle choices, including: Daily care of your teeth and gums. Regular physical activity. Eating a healthy diet. Avoiding tobacco and drug use. Limiting alcohol use. Practicing safe sex. Taking low-dose aspirin every day. Taking vitamin and mineral supplements as recommended by your health care provider. What happens during an annual well check? The services and screenings done by your health care provider during your annual well check will  depend on your age, overall health, lifestyle risk factors, and family history of disease. Counseling  Your health care provider may ask you questions about your: Alcohol use. Tobacco use. Drug use. Emotional well-being. Home and relationship well-being. Sexual activity. Eating habits. History of falls. Memory and ability to understand (cognition). Work and work Statistician. Reproductive health. Screening  You may have the following tests or measurements: Height, weight, and BMI. Blood pressure. Lipid and cholesterol levels. These may be checked every 5 years, or more frequently if you are over 31 years old. Skin check. Lung cancer screening. You may have this screening every year starting at age 75 if you have a 30-pack-year history of smoking and currently smoke or have quit within the past 15 years. Fecal occult blood test (FOBT) of the stool. You may have this test every year starting at age 63. Flexible sigmoidoscopy or colonoscopy. You may have a sigmoidoscopy every 5 years or a colonoscopy every 10 years starting at age 68. Hepatitis C blood test. Hepatitis B blood test. Sexually transmitted disease (STD) testing. Diabetes screening. This is done by checking your blood sugar (glucose) after you have not eaten for a while (fasting). You may have this done every 1-3 years. Bone density scan. This is done to screen for osteoporosis. You may have this done starting at age 76. Mammogram. This may be done every 1-2 years. Talk to your health care provider about  how often you should have regular mammograms. Talk with your health care provider about your test results, treatment options, and if necessary, the need for more tests. Vaccines  Your health care provider may recommend certain vaccines, such as: Influenza vaccine. This is recommended every year. Tetanus, diphtheria, and acellular pertussis (Tdap, Td) vaccine. You may need a Td booster every 10 years. Zoster vaccine. You may  need this after age 35. Pneumococcal 13-valent conjugate (PCV13) vaccine. One dose is recommended after age 79. Pneumococcal polysaccharide (PPSV23) vaccine. One dose is recommended after age 63. Talk to your health care provider about which screenings and vaccines you need and how often you need them. This information is not intended to replace advice given to you by your health care provider. Make sure you discuss any questions you have with your health care provider. Document Released: 08/01/2015 Document Revised: 03/24/2016 Document Reviewed: 05/06/2015 Elsevier Interactive Patient Education  2017 [place] Prevention in the Home Falls can cause injuries. They can happen to people of all ages. There are many things you can do to make your home safe and to help prevent falls. What can I do on the outside of my home? Regularly fix the edges of walkways and driveways and fix any cracks. Remove anything that might make you trip as you walk through a door, such as a raised step or threshold. Trim any bushes or trees on the path to your home. Use bright outdoor lighting. Clear any walking paths of anything that might make someone trip, such as rocks or tools. Regularly check to see if handrails are loose or broken. Make sure that both sides of any steps have handrails. Any raised decks and porches should have guardrails on the edges. Have any leaves, snow, or ice cleared regularly. Use sand or salt on walking paths during winter. Clean up any spills in your garage right away. This includes oil or grease spills. What can I do in the bathroom? Use night lights. Install grab bars by the toilet and in the tub and shower. Do not use towel bars as grab bars. Use non-skid mats or decals in the tub or shower. If you need to sit down in the shower, use a plastic, non-slip stool. Keep the floor dry. Clean up any water that spills on the floor as soon as it happens. Remove soap buildup in  the tub or shower regularly. Attach bath mats securely with double-sided non-slip rug tape. Do not have throw rugs and other things on the floor that can make you trip. What can I do in the bedroom? Use night lights. Make sure that you have a light by your bed that is easy to reach. Do not use any sheets or blankets that are too big for your bed. They should not hang down onto the floor. Have a firm chair that has side arms. You can use this for support while you get dressed. Do not have throw rugs and other things on the floor that can make you trip. What can I do in the kitchen? Clean up any spills right away. Avoid walking on wet floors. Keep items that you use a lot in easy-to-reach places. If you need to reach something above you, use a strong step stool that has a grab bar. Keep electrical cords out of the way. Do not use floor polish or wax that makes floors slippery. If you must use wax, use non-skid floor wax. Do not have throw rugs and other  things on the floor that can make you trip. What can I do with my stairs? Do not leave any items on the stairs. Make sure that there are handrails on both sides of the stairs and use them. Fix handrails that are broken or loose. Make sure that handrails are as long as the stairways. Check any carpeting to make sure that it is firmly attached to the stairs. Fix any carpet that is loose or worn. Avoid having throw rugs at the top or bottom of the stairs. If you do have throw rugs, attach them to the floor with carpet tape. Make sure that you have a light switch at the top of the stairs and the bottom of the stairs. If you do not have them, ask someone to add them for you. What else can I do to help prevent falls? Wear shoes that: Do not have high heels. Have rubber bottoms. Are comfortable and fit you well. Are closed at the toe. Do not wear sandals. If you use a stepladder: Make sure that it is fully opened. Do not climb a closed  stepladder. Make sure that both sides of the stepladder are locked into place. Ask someone to hold it for you, if possible. Clearly mark and make sure that you can see: Any grab bars or handrails. First and last steps. Where the edge of each step is. Use tools that help you move around (mobility aids) if they are needed. These include: Canes. Walkers. Scooters. Crutches. Turn on the lights when you go into a dark area. Replace any light bulbs as soon as they burn out. Set up your furniture so you have a clear path. Avoid moving your furniture around. If any of your floors are uneven, fix them. If there are any pets around you, be aware of where they are. Review your medicines with your doctor. Some medicines can make you feel dizzy. This can increase your chance of falling. Ask your doctor what other things that you can do to help prevent falls. This information is not intended to replace advice given to you by your health care provider. Make sure you discuss any questions you have with your health care provider. Document Released: 05/01/2009 Document Revised: 12/11/2015 Document Reviewed: 08/09/2014 Elsevier Interactive Patient Education  2017 [employer].

## 2022-10-07 NOTE — Progress Notes (Signed)
Subjective:   Monique Hall is a 82 y.o. female who presents for Medicare Annual (Subsequent) preventive examination.  Review of Systems     Cardiac Risk Factors include: advanced age (>28men, >27 women);dyslipidemia;hypertension     Objective:    Today's Vitals   10/07/22 0837  BP: (!) 160/80  Weight: 174 lb 9.6 oz (79.2 kg)  Height: 5\' 5"  (1.651 m)   Body mass index is 29.05 kg/m.     10/07/2022    8:47 AM 10/15/2021   11:54 AM 09/28/2021    3:31 PM 07/02/2021    2:17 PM 03/31/2021    1:48 PM 12/25/2020    1:08 PM 11/13/2020    9:58 AM  Advanced Directives  Does Patient Have a Medical Advance Directive? No No No No No No No  Would patient like information on creating a medical advance directive? No - Patient declined No - Patient declined No - Patient declined No - Patient declined No - Patient declined      Current Medications (verified) Outpatient Encounter Medications as of 10/07/2022  Medication Sig   acetaminophen (TYLENOL) 500 MG tablet Take 1 tablet (500 mg total) by mouth every 6 (six) hours as needed for mild pain or moderate pain. For shoulder pain   aspirin EC 81 MG tablet Take 81 mg by mouth daily.   Black Elderberry (SAMBUCUS ELDERBERRY PO) Take 1 each by mouth daily. gummy   calcium-vitamin D (OSCAL WITH D) 500-200 MG-UNIT tablet Take 1 tablet by mouth daily.   ezetimibe (ZETIA) 10 MG tablet TAKE 1 TABLET EVERY DAY   ibuprofen (ADVIL) 200 MG tablet Take 400 mg by mouth every 8 (eight) hours as needed for moderate pain.   letrozole (FEMARA) 2.5 MG tablet TAKE 1 TABLET EVERY DAY   loratadine (CLARITIN) 10 MG tablet Take 10 mg by mouth daily.   losartan-hydrochlorothiazide (HYZAAR) 100-12.5 MG tablet TAKE 1 TABLET EVERY DAY   Multiple Vitamin (MULTIVITAMIN) tablet Take 1 tablet by mouth daily.   triamcinolone (NASACORT) 55 MCG/ACT AERO nasal inhaler Place 1 spray into the nose at bedtime as needed (allergies).   No facility-administered encounter medications  on file as of 10/07/2022.    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   Esophagitis    Gastritis    Gastroesophageal reflux disease without esophagitis    History of kidney stones    Hypercholesteremia    Hypertension    Lipoma of neck 04/27/2018   Personal history of radiation therapy    Sciatica    Tendonitis, Ach[place] 2016   both    Thyroid nodule 2019   Past Surgical History:  Procedure Laterality Date   ABDOMINAL HYSTERECTOMY  1985   total for bleeding   APPENDECTOMY     BIOPSY THYROID  2019   BLADDER SUSPENSION  2004   BREAST BIOPSY Left 10/09/2020   [place] Bx, Q-clip, IMC   BREAST LUMPECTOMY,RADIO FREQ LOCALIZER,AXILLARY SENTINEL LYMPH NODE BIOPSY Left 11/06/2020   INVASIVE MAMMARY CARCINOMA, NO SPECIAL TYPE BREAST LUMPECTOMY,RADIO FREQ LOCALIZER,AXILLARY SENTINEL LYMPH NODE BIOPSY;  Surgeon: Benjamine Sprague, DO;  Location: [facility] ORS;  Service: General;  Laterality: Left;   lipoma excision  03/2018   posterior neck   Family History  Problem Relation Age of Onset   CAD Mother    Diabetes Mother  Arthritis Mother    Hypertension Mother    Parkinson's disease Father    COPD Father    Hearing loss Father    Breast cancer Neg Hx    Social History   Socioeconomic History   Marital status: Widowed    Spouse name: Not on file   Number of children: 3   Years of education: Not on file   Highest education level: 12th grade  Occupational History   Occupation: Retired  Tobacco Use   Smoking status: Former    Packs/day: 1.00    Years: 35.00    Additional pack years: 0.00    Total pack years: 35.00    Types: Cigarettes    Quit date: 09/01/2002    Years since quitting: 20.1   Smokeless tobacco: Never   Tobacco comments:    smoking cessation materials not required  Vaping Use   Vaping Use: Never used   Substance and Sexual Activity   Alcohol use: Yes    Alcohol/week: 0.0 standard drinks of alcohol    Comment: RARE   Drug use: Never   Sexual activity: Not Currently    Birth control/protection: Surgical    Comment: Hysterectomy  Other Topics Concern   Not on file  Social History Narrative   Pt's daughter lives with her   Social Determinants of Health   Financial Resource Strain: Low Risk  (10/07/2022)   Overall Financial Resource Strain (CARDIA)    Difficulty of Paying Living Expenses: Not hard at all  Food Insecurity: No Food Insecurity (10/07/2022)   Hunger Vital Sign    Worried About Running Out of Food in the Last Year: Never true    Ran Out of Food in the Last Year: Never true  Transportation Needs: No Transportation Needs (10/07/2022)   PRAPARE - Hydrologist (Medical): No    Lack of Transportation (Non-Medical): No  Physical Activity: Insufficiently Active (10/07/2022)   Exercise Vital Sign    Days of Exercise per Week: 2 days    Minutes of Exercise per Session: 20 min  Stress: No Stress Concern Present (10/07/2022)   [place]    Feeling of Stress : Not at all  Social Connections: Moderately Isolated (10/07/2022)   Social Connection and Isolation Panel [NHANES]    Frequency of Communication with Friends and Family: More than three times a week    Frequency of Social Gatherings with Friends and Family: More than three times a week    Attends Religious Services: More than 4 times per year    Active Member of [employer] or Organizations: No    Attends Archivist Meetings: Never    Marital Status: Widowed    Tobacco Counseling Counseling given: Not Answered Tobacco comments: smoking cessation materials not required   Clinical Intake:  Pre-visit preparation completed: Yes  Pain : No/denies pain     Nutritional Risks: None Diabetes: No  How often do you need to  have someone help you when you read instructions, pamphlets, or other written materials from your doctor or pharmacy?: 1 - Never  Diabetic?no  Interpreter Needed?: No  Information entered by :: Kirke Shaggy, LPN   Activities of Daily Living    10/07/2022    8:48 AM  In your present state of health, do you have any difficulty performing the following activities:  Hearing? 1  Vision? 0  Difficulty concentrating or making decisions? 0  Walking or climbing stairs?  1  Dressing or bathing? 0  Doing errands, shopping? 0  Preparing Food and eating ? N  Using the Toilet? N  In the past six months, have you accidently leaked urine? N  Do you have problems with loss of bowel control? N  Managing your Medications? N  Managing your Finances? N  Housekeeping or managing your Housekeeping? N    [name], [name], [name], [name] Grayland Ormond, Kathlene November, MD as Consulting Physician (Oncology) Noreene Filbert, MD as Consulting Physician (Radiation Oncology) Andrez Grime, MD as Referring Physician (Cardiology)  Indicate any recent Medical Services you may have received from other than [facility] providers in the past year (date may be approximate).     Assessment:   This is a routine wellness examination for [facility]  Hearing/Vision screen Hearing Screening - Comments:: Wears aids Vision Screening - Comments:: Wears glasses- Dr Ellin Mayhew  Dietary issues and exercise activities discussed: Current Exercise Habits: Home exercise routine, Type of exercise: walking, Time (Minutes): 20, Frequency (Times/Week): 2, Weekly Exercise (Minutes/Week): 40   Goals Addressed             This Visit's Progress    DIET - EAT MORE FRUITS AND VEGETABLES         Depression Screen    10/07/2022    8:46 AM 04/02/2022    9:41 AM  10/01/2021    8:56 AM 09/28/2021    3:30 PM 04/02/2021    9:22 AM 09/25/2020    8:11 AM 09/24/2020    3:49 PM  PHQ 2/9 Scores  PHQ - 2 Score 0 0 0 0 0 0 0  PHQ- 9 Score 0 1 0  1 0     Fall Risk    10/07/2022    8:48 AM 04/02/2022    9:41 AM 10/01/2021    8:57 AM 09/28/2021    3:32 PM 04/02/2021    9:22 AM  Fall Risk   Falls in the past year? 0 0 0 0 0  Number falls in past yr: 0 0 0 0 0  Injury with Fall? 0 0 0 0 0  Risk for fall due to : No Fall Risks No Fall Risks No Fall Risks No Fall Risks No Fall Risks  Follow up Falls prevention discussed;Falls evaluation completed Falls evaluation completed Falls evaluation completed Falls prevention discussed Falls evaluation completed    FALL RISK PREVENTION PERTAINING TO THE HOME:  Any stairs in or around the home? Yes  If so, are there any without handrails? No  Home free of loose throw rugs in walkways, pet beds, electrical cords, etc? Yes  Adequate lighting in your home to reduce risk of falls? Yes   ASSISTIVE DEVICES UTILIZED TO PREVENT FALLS:  Life alert? No  Use of a cane, walker or w/c? No  Grab bars in the bathroom? Yes  Shower chair or bench in shower? Yes  Elevated toilet seat or a handicapped toilet? Yes   TIMED UP AND GO:  Was the test performed? Yes .  Length of time to ambulate 10 feet: 4 sec.   Gait steady and fast without use of assistive device  Cognitive Function:        10/07/2022    8:52 AM 09/20/2019    9:00 AM 09/17/2019    3:37 PM 09/11/2018    2:56 PM 09/05/2017  10:39 AM  6CIT Screen  What Year? 0 points 0 points 0 points 0 points 0 points  What month? 0 points 0 points 0 points 0 points 0 points  What time? 0 points 0 points 0 points 0 points 0 points  Count back from 20 0 points 0 points 0 points 0 points 0 points  Months in reverse 0 points 0 points 0 points 0 points 0 points  Repeat phrase 0 points 2 points 0 points 0 points 6 points  Total Score 0 points 2 points 0 points 0 points 6 points     Immunizations Immunization History  Administered Date(s) Administered   Fluad Quad(high Dose 65+) 03/27/2020, 04/21/2021, 04/02/2022   Influenza-Unspecified 04/28/2017, 05/11/2018, 05/11/2019   PFIZER(Purple Top)SARS-COV-2 Vaccination 04/10/2020, 05/01/2020   Pneumococcal Conjugate-13 06/24/2014   Pneumococcal Polysaccharide-23 07/21/2011   Tdap 07/20/2005, 08/29/2015   Zoster, Live 07/20/2000    TDAP status: Up to date  Flu Vaccine status: Up to date  Pneumococcal vaccine status: Up to date  Covid-19 vaccine status: Completed vaccines  Qualifies for Shingles Vaccine? Yes   Zostavax completed Yes   Shingrix Completed?: No.    Education has been provided regarding the importance of this vaccine. Patient has been advised to call insurance company to determine out of pocket expense if they have not yet received this vaccine. Advised may also receive vaccine at local pharmacy or Health Dept. Verbalized acceptance and understanding.  Screening Tests Health Maintenance  Topic Date Due   Zoster Vaccines- Shingrix (1 of 2) Never done   COVID-19 Vaccine (3 - Pfizer risk series) 05/29/2020   MAMMOGRAM  09/24/2022   Medicare Annual Wellness (AWV)  10/07/2023   DTaP/Tdap/Td (3 - Td or Tdap) 08/28/2025   Pneumonia Vaccine 73+ Years old  Completed   INFLUENZA VACCINE  Completed   DEXA SCAN  Completed   HPV VACCINES  Aged Out    Health Maintenance  Health Maintenance Due  Topic Date Due   Zoster Vaccines- Shingrix (1 of 2) Never done   COVID-19 Vaccine (3 - Pfizer risk series) 05/29/2020   MAMMOGRAM  09/24/2022    Colorectal cancer screening: No longer required.   Mammogram status: Completed 09/23/21. Repeat every year  Bone Density status: Completed 01/14/22. Results reflect: Bone density results: OSTEOPENIA. Repeat every 5 years.  Lung Cancer Screening: (Low Dose CT Chest recommended if Age 62-80 years, 30 pack-year currently smoking OR have quit w/in 15years.) does not  qualify.   Additional Screening:  Hepatitis C Screening: does not qualify; Completed no  Vision Screening: Recommended annual ophthalmology exams for early detection of glaucoma and other disorders of the eye. Is the patient up to date with their annual eye exam?  Yes  Who is the provider or what is the name of the office in which the patient attends annual eye exams? Dr.Woodard If pt is not established with a provider, would they like to be referred to a provider to establish care? No .   Dental Screening: Recommended annual dental exams for proper oral hygiene  Community Resource Referral / Chronic Care Management: CRR required this visit?  No   CCM required this visit?  No      Plan:     I have personally reviewed and noted the following in the patient's chart:   Medical and social history Use of alcohol, tobacco or illicit drugs  Current medications and supplements including opioid prescriptions. Patient is not currently taking opioid prescriptions. Functional ability and status Nutritional  status Physical activity Advanced directives List of other physicians Hospitalizations, surgeries, and ER visits in previous 12 months Vitals Screenings to include cognitive, depression, and falls Referrals and appointments  In addition, I have reviewed and discussed with patient certain preventive protocols, quality metrics, and best practice recommendations. A written personalized care plan for preventive services as well as general preventive health recommendations were provided to patient.     Dionisio David, LPN   [id number]   Nurse Notes: none

## 2022-10-07 NOTE — Assessment & Plan Note (Signed)
Clinically stable exam with well controlled BP on hyzaar. Tolerating medications without side effects. Pt to continue current regimen and low sodium diet.

## 2022-10-07 NOTE — Patient Instructions (Signed)
Call [facility] to schedule your mammogram at [phone].  

## 2022-10-08 LAB — COMPREHENSIVE METABOLIC PANEL
ALT: 24 IU/L (ref 0–32)
AST: 23 IU/L (ref 0–40)
Albumin/Globulin Ratio: 1.7 (ref 1.2–2.2)
Albumin: 4.5 g/dL (ref 3.7–4.7)
Alkaline Phosphatase: 58 IU/L (ref 44–121)
BUN/Creatinine Ratio: 17 (ref 12–28)
BUN: 16 mg/dL (ref 8–27)
Bilirubin Total: 0.9 mg/dL (ref 0.0–1.2)
CO2: 23 mmol/L (ref 20–29)
Calcium: 11 mg/dL — ABNORMAL HIGH (ref 8.7–10.3)
Chloride: 103 mmol/L (ref 96–106)
Creatinine, Ser: 0.92 mg/dL (ref 0.57–1.00)
Globulin, Total: 2.7 g/dL (ref 1.5–4.5)
Glucose: 98 mg/dL (ref 70–99)
Potassium: 4.5 mmol/L (ref 3.5–5.2)
Sodium: 142 mmol/L (ref 134–144)
Total Protein: 7.2 g/dL (ref 6.0–8.5)
eGFR: 63 mL/min/{1.73_m2} (ref >59)

## 2022-10-08 LAB — CBC WITH DIFFERENTIAL/PLATELET
Basophils Absolute: 0.1 10*3/uL (ref 0.0–0.2)
Basos: 1 %
EOS (ABSOLUTE): 0.2 10*3/uL (ref 0.0–0.4)
Eos: 3 %
Hematocrit: 37.9 % (ref 34.0–46.6)
Hemoglobin: 12.2 g/dL (ref 11.1–15.9)
Immature Grans (Abs): 0 10*3/uL (ref 0.0–0.1)
Immature Granulocytes: 0 %
Lymphocytes Absolute: 2.7 10*3/uL (ref 0.7–3.1)
Lymphs: 41 %
MCH: 30.4 pg (ref 26.6–33.0)
MCHC: 32.2 g/dL (ref 31.5–35.7)
MCV: 95 fL (ref 79–97)
Monocytes Absolute: 0.7 10*3/uL (ref 0.1–0.9)
Monocytes: 11 %
Neutrophils Absolute: 3 10*3/uL (ref 1.4–7.0)
Neutrophils: 44 %
Platelets: 367 10*3/uL (ref 150–450)
RBC: 4.01 x10E6/uL (ref 3.77–5.28)
RDW: 13.3 % (ref 11.7–15.4)
WBC: 6.6 10*3/uL (ref 3.4–10.8)

## 2022-10-08 LAB — LIPID PANEL
Chol/HDL Ratio: 4.4 ratio (ref 0.0–4.4)
Cholesterol, Total: 249 mg/dL — ABNORMAL HIGH (ref 100–199)
HDL: 57 mg/dL (ref >39)
LDL Chol Calc (NIH): 149 mg/dL — ABNORMAL HIGH (ref 0–99)
Triglycerides: 237 mg/dL — ABNORMAL HIGH (ref 0–149)
VLDL Cholesterol Cal: 43 mg/dL — ABNORMAL HIGH (ref 5–40)

## 2022-10-08 LAB — HEMOGLOBIN A1C
Est. average glucose Bld gHb Est-mCnc: 120 mg/dL
Hgb A1c MFr Bld: 5.8 % — ABNORMAL HIGH (ref 4.8–5.6)

## 2022-10-08 LAB — TSH+FREE T4
Free T4: 1.13 ng/dL (ref 0.82–1.77)
TSH: 2.06 u[IU]/mL (ref 0.450–4.500)

## 2022-10-18 ENCOUNTER — Ambulatory Visit
Admission: RE | Admit: 2022-10-18 | Discharge: 2022-10-18 | Disposition: A | Discharge status: Home / Self Care | Payer: Medicare Other | Source: Ambulatory Visit | Attending: Internal Medicine | Admitting: Internal Medicine

## 2022-10-18 DIAGNOSIS — C50212 Malignant neoplasm of upper-inner quadrant of left female breast: Secondary | ICD-10-CM | POA: Insufficient documentation

## 2022-10-18 DIAGNOSIS — Z170 Estrogen receptor positive status [ER+]: Secondary | ICD-10-CM | POA: Diagnosis not present

## 2022-10-18 DIAGNOSIS — R928 Other abnormal and inconclusive findings on diagnostic imaging of breast: Secondary | ICD-10-CM | POA: Diagnosis not present

## 2022-10-26 ENCOUNTER — Other Ambulatory Visit: Payer: Self-pay | Admitting: Oncology

## 2022-10-26 NOTE — Telephone Encounter (Signed)
Patient last seen 3/30//23. The follow up BMD was scheduled . But the 6 month follow up visit was not scheduled. Per protocol. I cannot refill this medicine as it has been more than 6 months since she was seen.   Follow-up and Disposition History  10/15/2021 1519 - Jeralyn Ruths, MD  Check-out note: BMD in June 2023, preferable on on Thursday or Friday. Video visit in 6 months with APP.  [email or web address]

## 2022-10-29 DIAGNOSIS — H401431 Capsular glaucoma with pseudoexfoliation of lens, bilateral, mild stage: Secondary | ICD-10-CM | POA: Diagnosis not present

## 2022-10-29 DIAGNOSIS — H2513 Age-related nuclear cataract, bilateral: Secondary | ICD-10-CM | POA: Diagnosis not present

## 2022-11-05 ENCOUNTER — Inpatient Hospital Stay: Payer: Medicare Other | Attending: Oncology | Admitting: Oncology

## 2022-11-05 ENCOUNTER — Other Ambulatory Visit: Payer: Self-pay | Admitting: *Deleted

## 2022-11-05 DIAGNOSIS — Z79811 Long term (current) use of aromatase inhibitors: Secondary | ICD-10-CM

## 2022-11-05 DIAGNOSIS — C50212 Malignant neoplasm of upper-inner quadrant of left female breast: Secondary | ICD-10-CM | POA: Diagnosis not present

## 2022-11-05 DIAGNOSIS — Z170 Estrogen receptor positive status [ER+]: Secondary | ICD-10-CM

## 2022-11-05 DIAGNOSIS — M8580 Other specified disorders of bone density and structure, unspecified site: Secondary | ICD-10-CM

## 2022-11-05 DIAGNOSIS — Z780 Asymptomatic menopausal state: Secondary | ICD-10-CM

## 2022-11-05 NOTE — Progress Notes (Signed)
[facility]  [phone] [phone]  ID: Monique Hall OB: 12-21-1940  MR#: [id number]  AOZ#:[id number]  Patient Care Team: Reubin Milan, MD as PCP - General (Internal Medicine) Merri Ray, MD as Referring Physician (Physical Medicine and Rehabilitation) Jim Like, [name] Orlie Dakin, Tollie Pizza, MD as Consulting Physician (Oncology) Carmina Miller, MD as Consulting Physician (Radiation Oncology) Armando Reichert, MD as Referring Physician (Cardiology)  I connected with Monique Hall on 11/05/22 at 11:15 AM EDT by video enabled telemedicine visit and verified that I am speaking with the correct person using two identifiers.   I discussed the limitations, risks, security and privacy concerns of performing an evaluation and management service by telemedicine and the availability of in-person appointments. I also discussed with the patient that there may be a patient responsible charge related to this service. The patient expressed understanding and agreed to proceed.   Other persons participating in the visit and their role in the encounter: Patient, MD.  Patient's location: Home. Provider's location: Clinic.  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  INTERVAL HISTORY: Patient agreed to video assisted telemedicine visit for routine 45-month evaluation.  She continues to feel well and remains asymptomatic, although admits to occasional lower back pain.  She is tolerating letrozole without significant side effects. She has no neurologic complaints.  She denies any recent fevers or illnesses.  She has a good appetite and denies weight loss.  She has no chest pain, shortness of breath, cough, or hemoptysis.  She denies any nausea, vomiting, constipation, or diarrhea.  She has no urinary complaints.  Patient offers no further  specific complaints today.  REVIEW OF SYSTEMS:   Review of Systems  Constitutional: Negative.  Negative for fever, malaise/fatigue and weight loss.  Respiratory: Negative.  Negative for cough, hemoptysis and shortness of breath.   Cardiovascular: Negative.  Negative for chest pain and leg swelling.  Gastrointestinal: Negative.  Negative for abdominal pain.  Genitourinary: Negative.  Negative for dysuria.  Musculoskeletal:  Positive for back pain.  Skin: Negative.  Negative for rash.  Neurological: Negative.  Negative for dizziness, focal weakness, weakness and headaches.  Psychiatric/Behavioral: Negative.  The patient is not nervous/anxious.     As per HPI. Otherwise, a complete review of systems is negative.  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/2020   Esophagitis    Gastritis    Gastroesophageal reflux disease without esophagitis    History of kidney stones    Hypercholesteremia    Hypertension    Lipoma of neck 04/27/2018   Personal history of radiation therapy    Sciatica    Tendonitis, Achilles 2016   both    Thyroid nodule 2019    PAST SURGICAL HISTORY: Past Surgical History:  Procedure Laterality Date   ABDOMINAL HYSTERECTOMY  1985   total for bleeding   APPENDECTOMY     BIOPSY THYROID  2019   BLADDER SUSPENSION  2004   BREAST BIOPSY Left 10/09/2020   [place] Bx, Q-clip, IMC   BREAST LUMPECTOMY,RADIO FREQ LOCALIZER,AXILLARY SENTINEL LYMPH NODE BIOPSY Left 11/06/2020   INVASIVE MAMMARY CARCINOMA, NO SPECIAL TYPE BREAST LUMPECTOMY,RADIO FREQ LOCALIZER,AXILLARY SENTINEL LYMPH NODE BIOPSY;  Surgeon: Sung Amabile, DO;  Location: [facility] ORS;  Service: General;  Laterality: Left;  lipoma excision  03/2018   posterior neck    FAMILY HISTORY: Family History  Problem Relation Age of Onset   CAD Mother    Diabetes Mother    Arthritis Mother     Hypertension Mother    Parkinson's disease Father    COPD Father    Hearing loss Father    Breast cancer Neg Hx     ADVANCED DIRECTIVES (Y/N):  N  HEALTH MAINTENANCE: Social History   Tobacco Use   Smoking status: Former    Packs/day: 1.00    Years: 35.00    Additional pack years: 0.00    Total pack years: 35.00    Types: Cigarettes    Quit date: 09/01/2002    Years since quitting: 20.1   Smokeless tobacco: Never   Tobacco comments:    smoking cessation materials not required  Vaping Use   Vaping Use: Never used  Substance Use Topics   Alcohol use: Yes    Alcohol/week: 0.0 standard drinks of alcohol    Comment: RARE   Drug use: Never     Colonoscopy:  PAP:  Bone density:  Lipid panel:  Allergies  Allergen Reactions   Baclofen     Other reaction(s): Unknown   Statins Other (See Comments)    myalgia   Etodolac Nausea Only    Current Outpatient Medications  Medication Sig Dispense Refill   acetaminophen (TYLENOL) 500 MG tablet Take 1 tablet (500 mg total) by mouth every 6 (six) hours as needed for mild pain or moderate pain. For shoulder pain     aspirin EC 81 MG tablet Take 81 mg by mouth daily.     Black Elderberry (SAMBUCUS ELDERBERRY PO) Take 1 each by mouth daily. gummy     calcium-vitamin D (OSCAL WITH D) 500-200 MG-UNIT tablet Take 1 tablet by mouth daily.     ezetimibe (ZETIA) 10 MG tablet TAKE 1 TABLET EVERY DAY 90 tablet 3   ibuprofen (ADVIL) 200 MG tablet Take 400 mg by mouth every 8 (eight) hours as needed for moderate pain.     letrozole (FEMARA) 2.5 MG tablet TAKE 1 TABLET EVERY DAY 90 tablet 3   loratadine (CLARITIN) 10 MG tablet Take 10 mg by mouth daily.     losartan-hydrochlorothiazide (HYZAAR) 100-12.5 MG tablet TAKE 1 TABLET EVERY DAY 90 tablet 3   Multiple Vitamin (MULTIVITAMIN) tablet Take 1 tablet by mouth daily.     triamcinolone (NASACORT) 55 MCG/ACT AERO nasal inhaler Place 1 spray into the nose at bedtime as needed (allergies).      No current facility-administered medications for this visit.    OBJECTIVE: There were no vitals filed for this visit.    There is no height or weight on file to calculate BMI.    ECOG FS:0 - Asymptomatic  General: Well-developed, well-nourished, no acute distress. HEENT: Normocephalic. Neuro: Alert, answering all questions appropriately. Cranial nerves grossly intact. Psych: Normal affect.  LAB RESULTS:  Lab Results  Component Value Date   NA 142 10/07/2022   K 4.5 10/07/2022   CL 103 10/07/2022   CO2 23 10/07/2022   GLUCOSE 98 10/07/2022   BUN 16 10/07/2022   CREATININE 0.92 10/07/2022   CALCIUM 11.0 (H) 10/07/2022   PROT 7.2 10/07/2022   ALBUMIN 4.5 10/07/2022   AST 23 10/07/2022   ALT 24 10/07/2022   ALKPHOS 58 10/07/2022   BILITOT 0.9 10/07/2022   GFRNONAA 55 (L) 09/20/2019   GFRAA 63 09/20/2019    Lab Results  Component Value Date   WBC 6.6 10/07/2022   NEUTROABS 3.0 10/07/2022   HGB 12.2 10/07/2022   HCT 37.9 10/07/2022   MCV 95 10/07/2022   PLT 367 10/07/2022     STUDIES: MM 3D DIAGNOSTIC MAMMOGRAM BILATERAL BREAST  Result Date: 10/18/2022 CLINICAL DATA:  LEFT lumpectomy April 2022. status post radiation. On letrozole. EXAM: DIGITAL DIAGNOSTIC BILATERAL MAMMOGRAM WITH TOMOSYNTHESIS TECHNIQUE: Bilateral digital diagnostic mammography and breast tomosynthesis was performed. COMPARISON:  Previous exam(s). ACR Breast Density Category b: There are scattered areas of fibroglandular density. FINDINGS: There is density and architectural distortion within the LEFT breast, consistent with postsurgical changes. These are stable in comparison to prior. No suspicious mass, distortion, or microcalcifications are identified to suggest presence of malignancy. IMPRESSION: No mammographic evidence of malignancy. RECOMMENDATION: Per protocol, as the patient is now 2 or more years status post lumpectomy, she may return to annual screening mammography in 1 year. However, given  the history of breast cancer, the patient remains eligible for annual diagnostic mammography if preferred. (Code:SM-B-01Y) I have discussed the findings and recommendations with the patient. If applicable, a reminder letter will be sent to the patient regarding the next appointment. BI-RADS CATEGORY  2: Benign. Electronically Signed   By: Meda Klinefelter M.D.   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  She completed adjuvant XRT on December 26, 2020.  Continue letrozole for a total of 5 years completing treatment in June 2027.  Patient's most recent mammogram on October 18, 2022 was reported as BI-RADS 2.  Repeat in April 2025.  Patient will have video-assisted telemedicine visit in 6 months for routine evaluation.   Osteopenia: Patient's most recent bone mineral density on January 14, 2022 reported T-score of -2.4.  Continue calcium and vitamin D supplement.  Repeat in June 2024.  Hypertension: Continue monitoring and treatment per primary care.  I provided 20 minutes of face-to-face video visit time during this encounter which included chart review, counseling, and coordination of care as documented above.   Patient expressed understanding and was in agreement with this plan. She also understands that She can call clinic at any time with any questions, concerns, or complaints.    Cancer Staging  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 Staging form: Breast, AJCC 8th Edition - Pathologic stage from 11/13/2020: Stage IA (pT1a, pN0, cM0, G1, ER+, PR+, HER2-) - Signed by Jeralyn Ruths, MD on 11/13/2020 Stage prefix: Initial  diagnosis Histologic grading system: 3 grade system   Jeralyn Ruths, MD   11/05/2022 10:57 AM

## 2022-11-22 DIAGNOSIS — M5416 Radiculopathy, lumbar region: Secondary | ICD-10-CM | POA: Diagnosis not present

## 2022-11-22 DIAGNOSIS — M7541 Impingement syndrome of right shoulder: Secondary | ICD-10-CM | POA: Diagnosis not present

## 2022-11-22 DIAGNOSIS — M48062 Spinal stenosis, lumbar region with neurogenic claudication: Secondary | ICD-10-CM | POA: Diagnosis not present

## 2022-12-02 DIAGNOSIS — M48062 Spinal stenosis, lumbar region with neurogenic claudication: Secondary | ICD-10-CM | POA: Diagnosis not present

## 2022-12-02 DIAGNOSIS — M5416 Radiculopathy, lumbar region: Secondary | ICD-10-CM | POA: Diagnosis not present

## 2022-12-30 DIAGNOSIS — M7541 Impingement syndrome of right shoulder: Secondary | ICD-10-CM | POA: Diagnosis not present

## 2022-12-30 DIAGNOSIS — M5416 Radiculopathy, lumbar region: Secondary | ICD-10-CM | POA: Diagnosis not present

## 2022-12-30 DIAGNOSIS — M48062 Spinal stenosis, lumbar region with neurogenic claudication: Secondary | ICD-10-CM | POA: Diagnosis not present

## 2023-01-13 ENCOUNTER — Encounter: Payer: Self-pay | Admitting: Radiation Oncology

## 2023-01-13 ENCOUNTER — Ambulatory Visit
Admission: RE | Admit: 2023-01-13 | Discharge: 2023-01-13 | Disposition: A | Discharge status: Home / Self Care | Payer: Medicare Other | Source: Ambulatory Visit | Attending: Radiation Oncology | Admitting: Radiation Oncology

## 2023-01-13 VITALS — BP 149/86 | HR 81 | Temp 97.6°F | Resp 16 | Wt 173.0 lb

## 2023-01-13 DIAGNOSIS — Z170 Estrogen receptor positive status [ER+]: Secondary | ICD-10-CM | POA: Diagnosis not present

## 2023-01-13 DIAGNOSIS — Z79811 Long term (current) use of aromatase inhibitors: Secondary | ICD-10-CM | POA: Insufficient documentation

## 2023-01-13 DIAGNOSIS — Z923 Personal history of irradiation: Secondary | ICD-10-CM | POA: Insufficient documentation

## 2023-01-13 DIAGNOSIS — C50212 Malignant neoplasm of upper-inner quadrant of left female breast: Secondary | ICD-10-CM | POA: Insufficient documentation

## 2023-01-13 NOTE — Progress Notes (Signed)
Radiation Oncology Follow up Note  Name: Monique Hall   Date:   01/13/2023 MRN:  [id number] DOB: 1940-09-30    This 82 y.o. female presents to the clinic today for 2-year follow-up status post whole breast radiation to her left breast for stage Ia ER/PR positive invasive mammary carcinoma.Marland Kitchen  REFERRING PROVIDER: Reubin Milan, MD  HPI: Patient is a 82 year old female now out 2 years having completed whole breast radiation to her left breast for stage Ia ER/PR positive invasive mammary carcinoma.  Seen today in routine follow-up she is doing well.  She specifically denies breast tenderness cough or bone pain..  She had mammograms back in April which I have reviewed were BI-RADS Category 2 benign.  She is currently on Femara tolerating it well without side effect.  COMPLICATIONS OF TREATMENT: none  FOLLOW UP COMPLIANCE: keeps appointments   PHYSICAL EXAM:  BP (!) 149/86   Pulse 81   Temp 97.6 F (36.4 C) (Tympanic)   Resp 16   Wt 173 lb (78.5 kg)   BMI 28.79 kg/m  Lungs are clear to A&P cardiac examination essentially unremarkable with regular rate and rhythm. No dominant mass or nodularity is noted in either breast in 2 positions examined. Incision is well-healed. No axillary or supraclavicular adenopathy is appreciated. Cosmetic result is excellent.  Well-developed well-nourished patient in NAD. HEENT reveals PERLA, EOMI, discs not visualized.  Oral cavity is clear. No oral mucosal lesions are identified. Neck is clear without evidence of cervical or supraclavicular adenopathy. Lungs are clear to A&P. Cardiac examination is essentially unremarkable with regular rate and rhythm without murmur rub or thrill. Abdomen is benign with no organomegaly or masses noted. Motor sensory and DTR levels are equal and symmetric in the upper and lower extremities. Cranial nerves II through XII are grossly intact. Proprioception is intact. No peripheral adenopathy or edema is identified. No motor or  sensory levels are noted. Crude visual fields are within normal range.  RADIOLOGY RESULTS: Mammograms reviewed compatible with above-stated findings.  PLAN: Present time patient is doing well now out 2 years with no evidence of disease.  And pleased with her overall progress.  She continues on endocrine therapy.  Of asked to see her back in 1 year and then will discontinue follow-up care.  Patient is to call with any concerns.  I would like to take this opportunity to thank you for allowing me to participate in the care of your patient.Carmina Miller, MD

## 2023-01-17 ENCOUNTER — Ambulatory Visit
Admission: RE | Admit: 2023-01-17 | Discharge: 2023-01-17 | Disposition: A | Discharge status: Home / Self Care | Payer: Medicare Other | Source: Ambulatory Visit | Attending: Oncology | Admitting: Oncology

## 2023-01-17 DIAGNOSIS — M810 Age-related osteoporosis without current pathological fracture: Secondary | ICD-10-CM | POA: Diagnosis not present

## 2023-01-17 DIAGNOSIS — Z79811 Long term (current) use of aromatase inhibitors: Secondary | ICD-10-CM | POA: Insufficient documentation

## 2023-01-17 DIAGNOSIS — Z780 Asymptomatic menopausal state: Secondary | ICD-10-CM | POA: Diagnosis present

## 2023-01-22 IMAGING — US US BREAST*L* LIMITED INC AXILLA
1 series · 8 of 8 positions shown · non-contrast
Comparison: Previous exam(s).

CLINICAL DATA: 80-year-old female presenting as a recall from
screening for possible left breast mass.

EXAM:
DIGITAL DIAGNOSTIC UNILATERAL LEFT MAMMOGRAM WITH TOMOSYNTHESIS AND
CAD; ULTRASOUND LEFT BREAST LIMITED
TECHNIQUE: Left digital diagnostic mammography and breast tomosynthesis was
performed. The images were evaluated with computer-aided detection.;
Targeted ultrasound examination of the left breast was performed

[Series 1: us breast*left* limited inc axilla · 0.08mm/px · 8 of 8 slices shown]
[im 1/8]
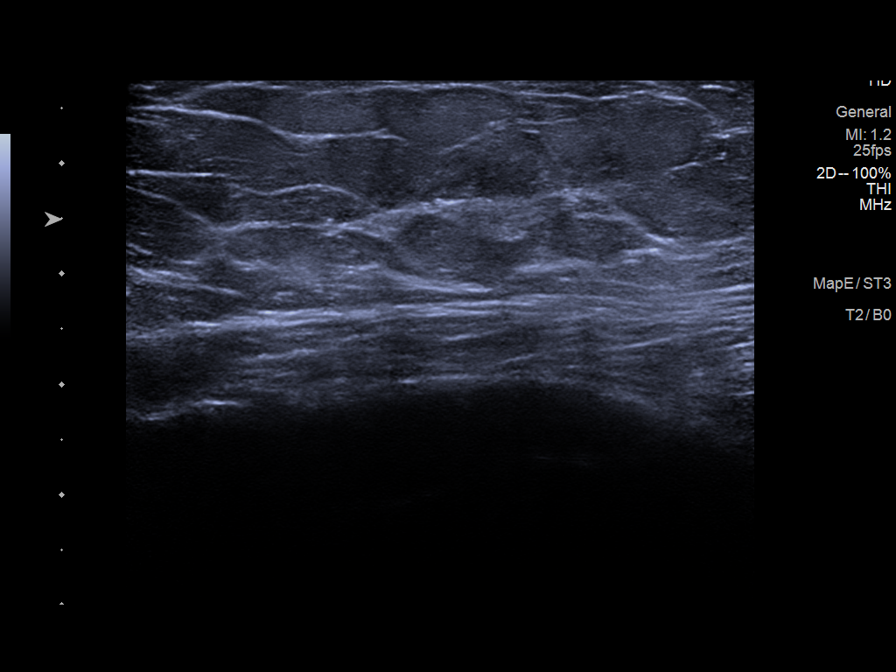
[im 2/8]
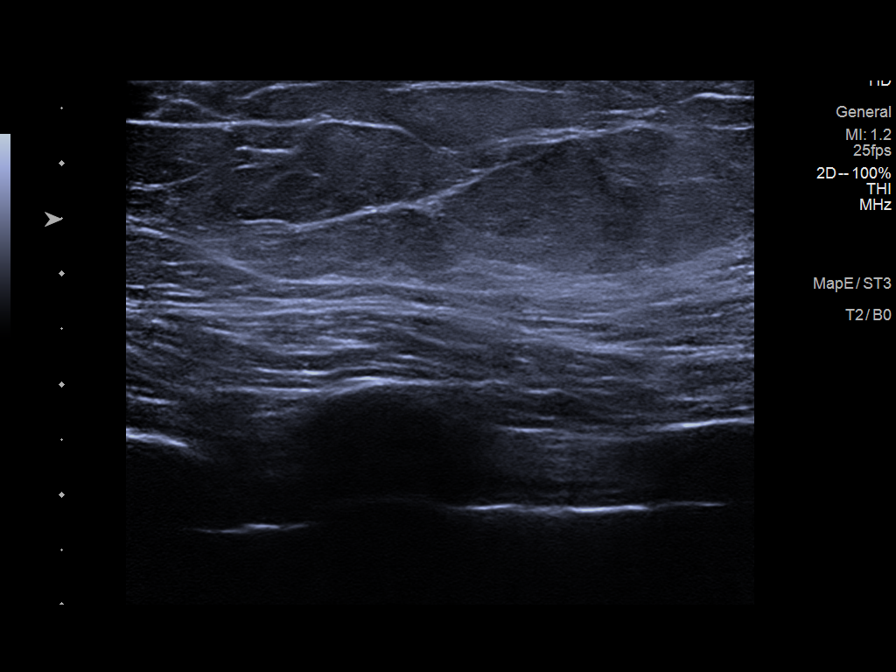
[im 3/8]
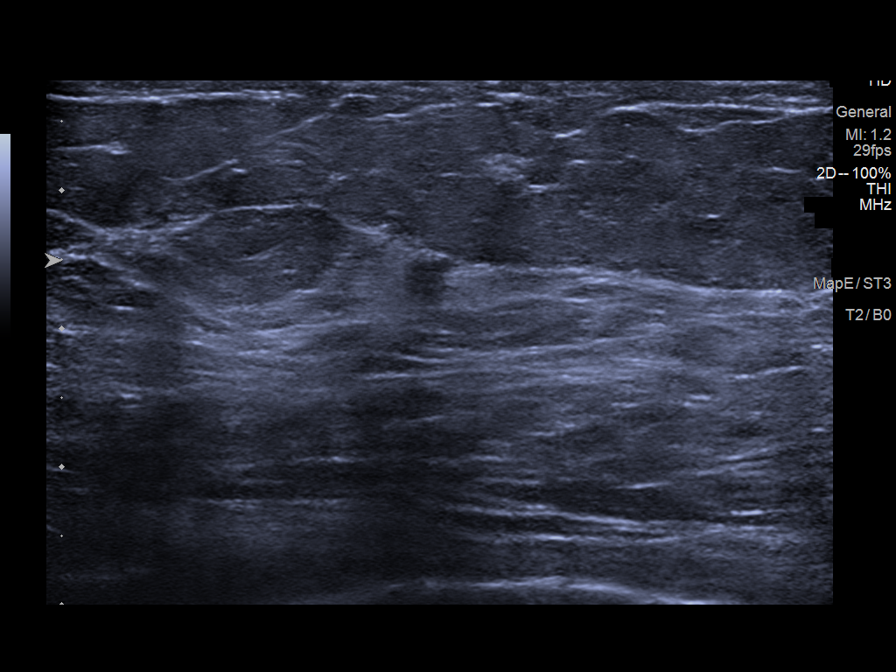
[im 4/8]
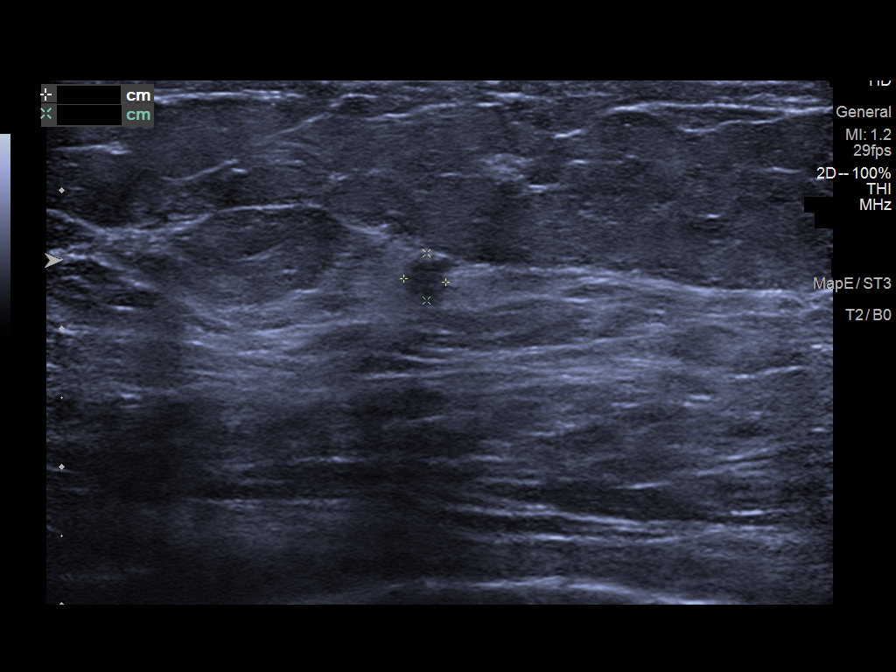
[im 5/8]
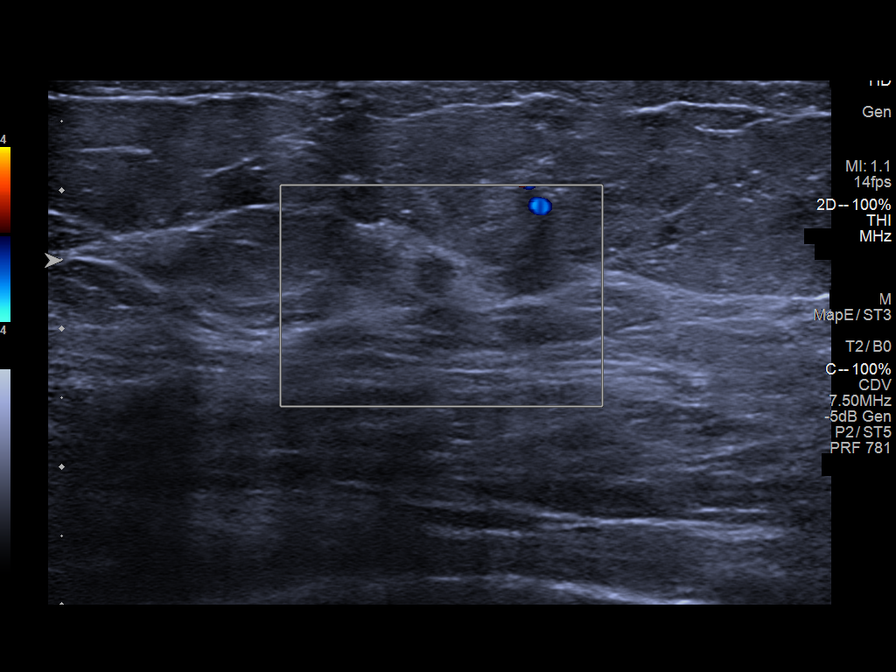
[im 6/8]
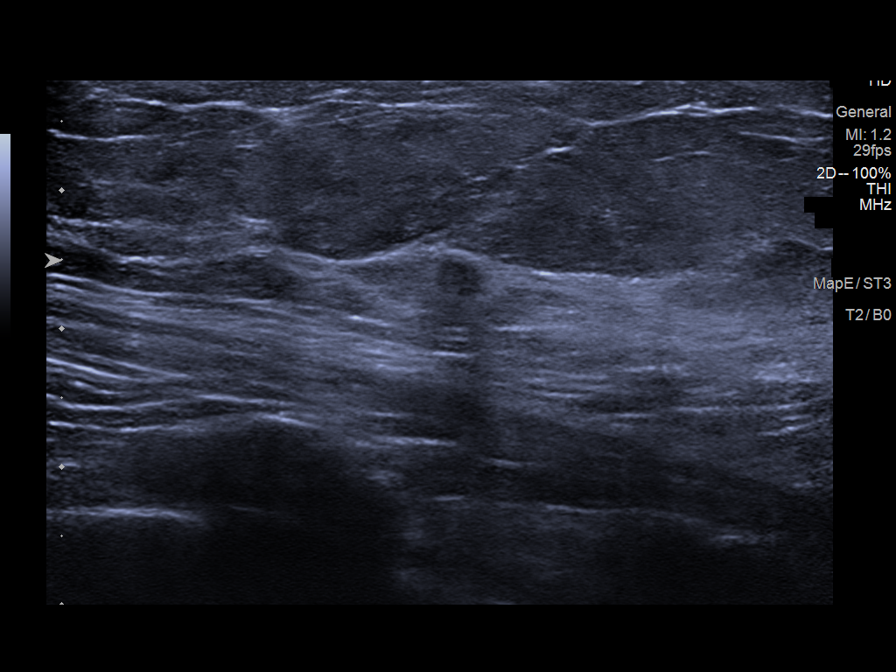
[im 7/8]
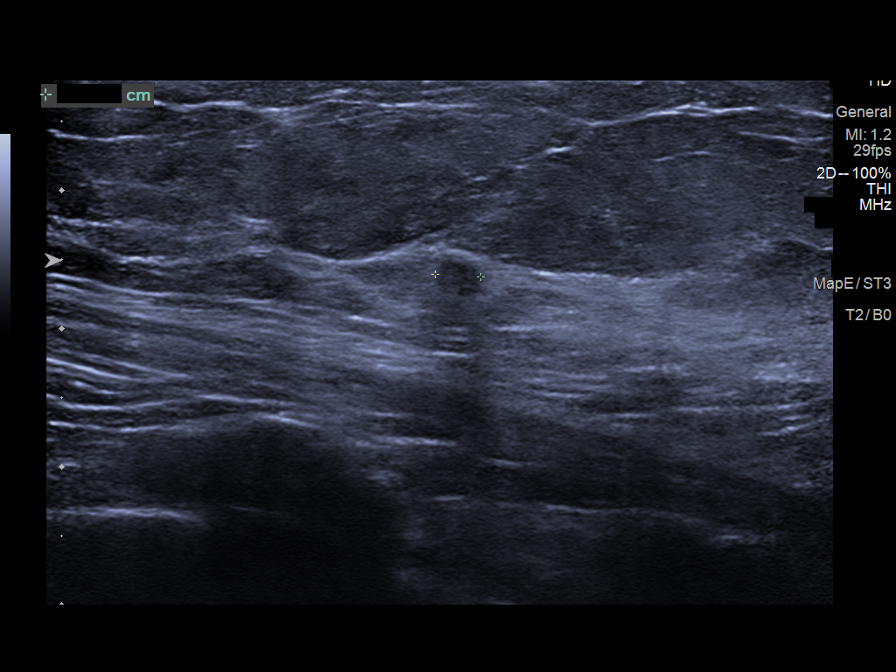
[im 8/8]
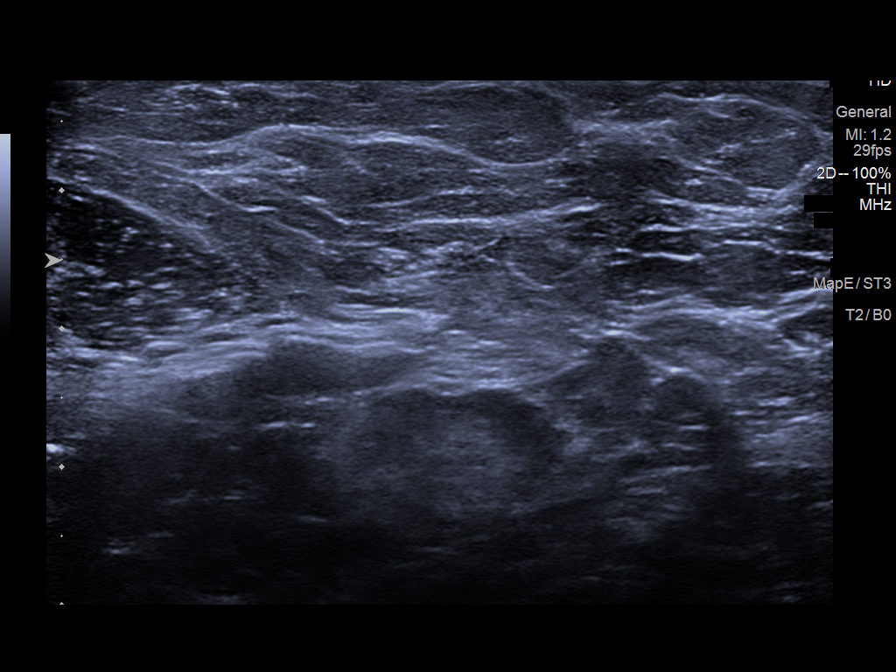

[8 of 8 positions shown; findings below may reference images not displayed]

ACR Breast Density Category b: There are scattered areas of
fibroglandular density.
FINDINGS: Mammogram:

Spot compression tomosynthesis views of the left breast were
performed. There is persistence of a possible small obscured mass
measuring 3 mm in the upper slightly inner aspect of the breast.

Ultrasound:

Targeted ultrasound performed in the left breast at 11 o'clock 6 cm
from the nipple demonstrating an irregular isoechoic mass measuring
0.3 x 0.3 x 0.3 cm. No internal vascularity. This likely corresponds
to the small mass identified mammographically. Targeted ultrasound
of the left axilla demonstrates normal lymph nodes.
IMPRESSION: Indeterminate 3 mm mass in the left breast at 11 o'clock.

RECOMMENDATION:
Ultrasound-guided core needle biopsy of the left breast mass at 11
o'clock.

I have discussed the findings and recommendations with the patient
who agrees to proceed with biopsy. The patient will be contacted by
our scheduler to arrange the biopsy appointment.

BI-RADS CATEGORY  4: Suspicious.

## 2023-02-22 IMAGING — MG MM PLC BREAST LOC DEV 1ST LESION INC MAMMO GUIDE*L*
7 series · 7 of 7 positions shown · non-contrast
Comparison: Previous exams.

CLINICAL DATA: 80-year-old with a biopsy-proven invasive mammary
carcinoma of no special type involving the UPPER INNER QUADRANT of
the LEFT breast. Radiofrequency tag localization is performed in
anticipation of lumpectomy which is scheduled for 11/06/2020.

EXAM:
NEEDLE LOCALIZATION OF THE LEFT BREAST WITH MAMMO GUIDANCE

[L ML (1 of 4)]
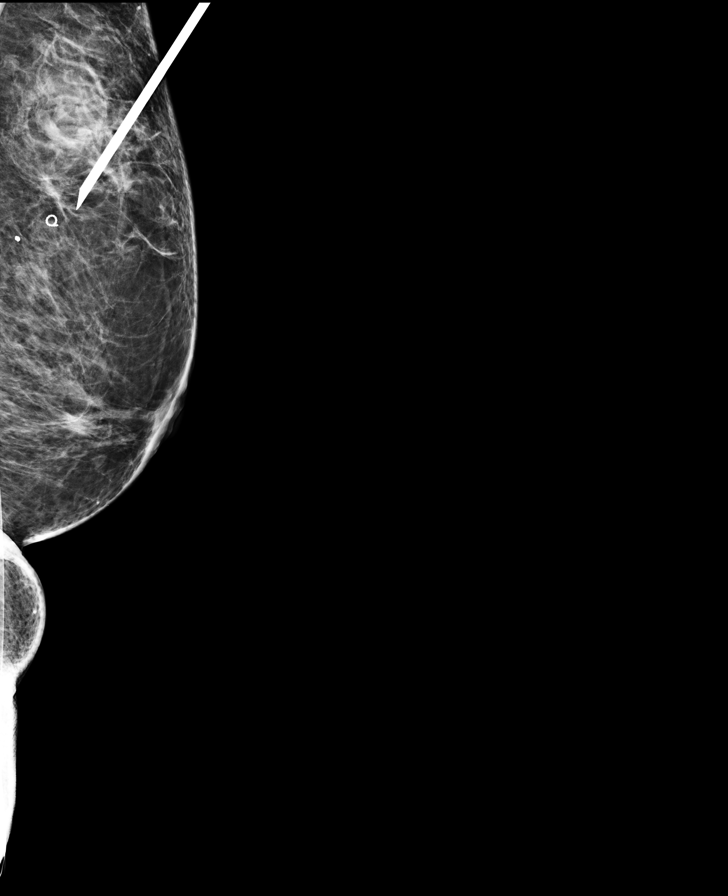

[L ML (2 of 4)]
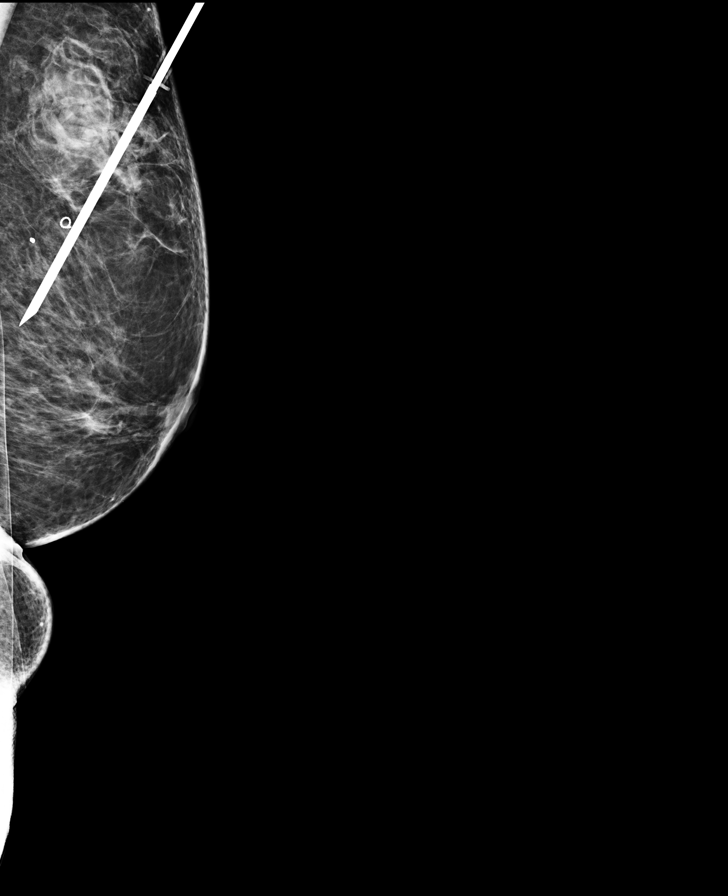

[L CC (1 of 3)]
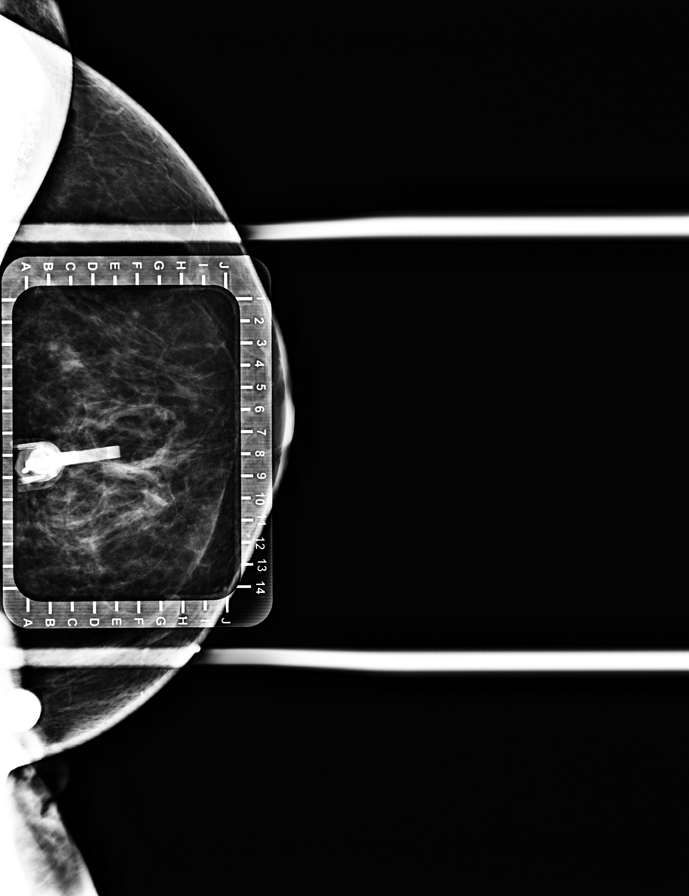

[L ML (3 of 4)]
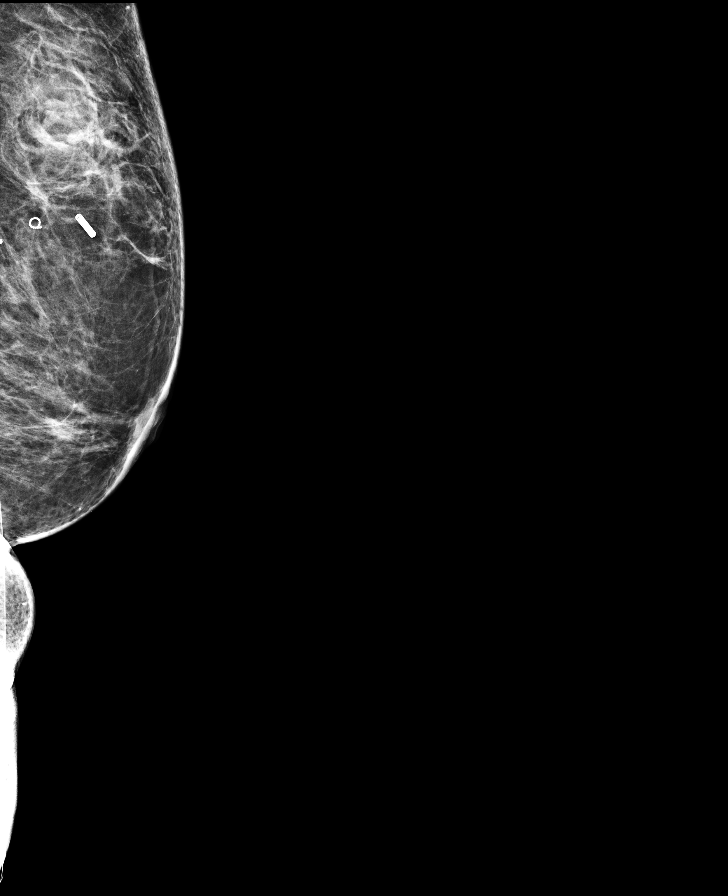

[L ML (4 of 4)]
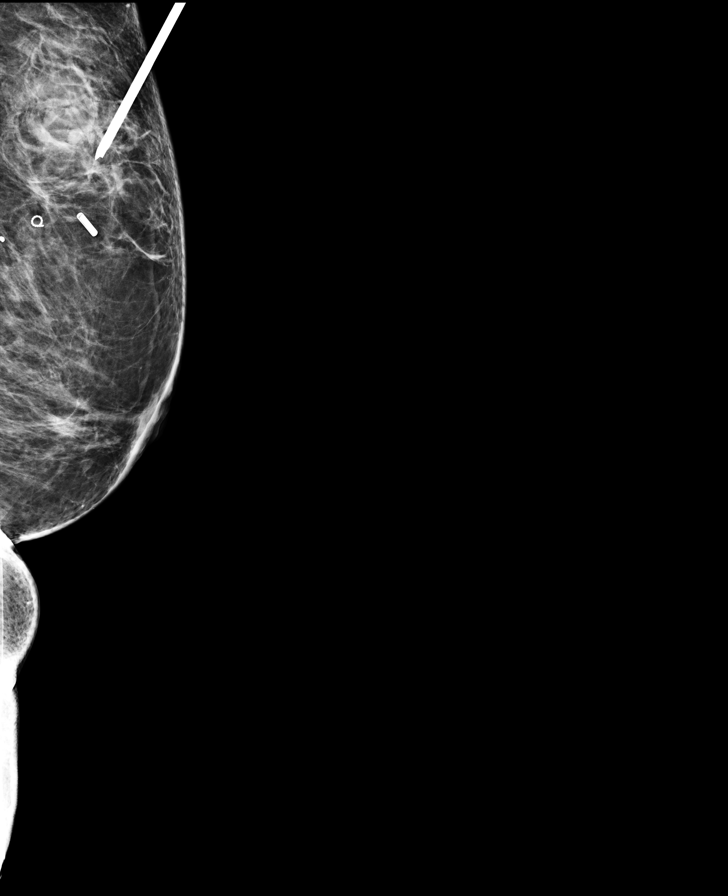

[L CC (2 of 3)]
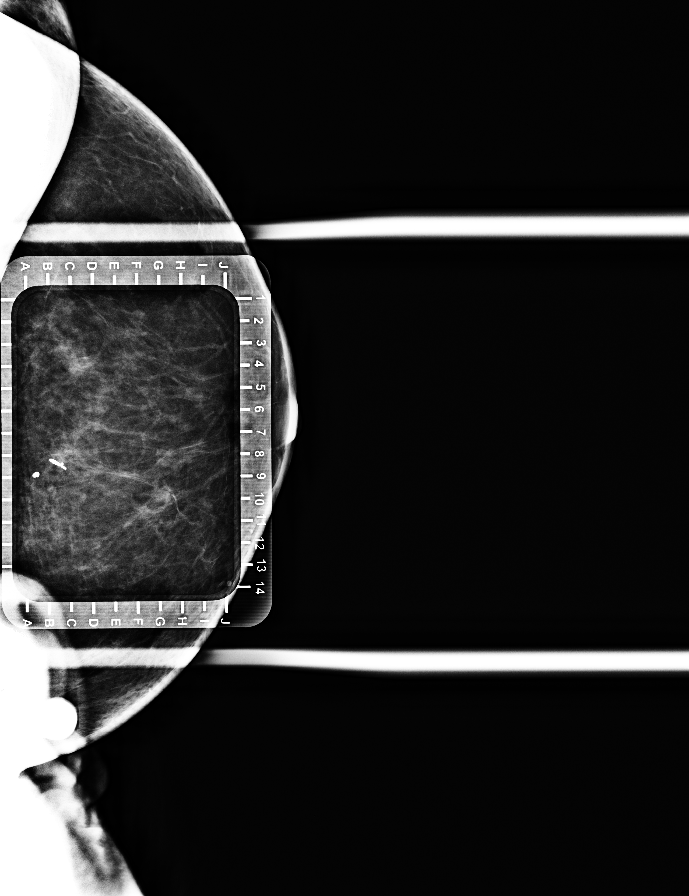

[L CC (3 of 3)]
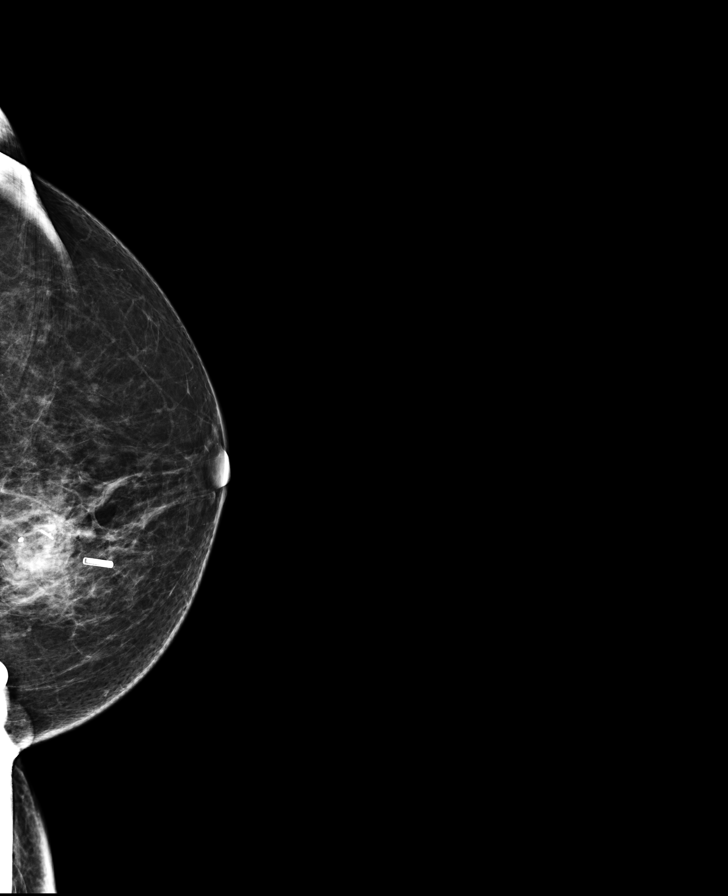

[7 of 7 positions shown; findings below may reference images not displayed]

FINDINGS: Patient presents for needle localization prior to LEFT breast
lumpectomy. I met with the patient and we discussed the procedure of
needle localization including benefits and alternatives. We
discussed the high likelihood of a successful procedure. We
discussed the risks of the procedure, including infection, bleeding,
tissue injury, and further surgery. Informed, written consent was
given. The usual time-out protocol was performed immediately prior
to the procedure.

Using mammographic guidance, sterile technique with chlorhexidine as
skin antisepsis, 1% lidocaine as local anesthetic, a 7 cm LOCalizer
radiofrequency tag needle was used to localize the Q shaped tissue
marking clip associated with the biopsy-proven malignancy using a
superior approach. Radiofrequency tag function was confirmed with an
auditory signal. The tag is approximately 10 mm ANTERIOR to the Q
clip. The images are marked for Dr. Zinho.
IMPRESSION: Radiofrequency tag localization of the LEFT breast. The tag is
approximately 10 mm ANTERIOR to the Q clip. No apparent
complications.

## 2023-03-10 DIAGNOSIS — M5416 Radiculopathy, lumbar region: Secondary | ICD-10-CM | POA: Diagnosis not present

## 2023-03-10 DIAGNOSIS — M48062 Spinal stenosis, lumbar region with neurogenic claudication: Secondary | ICD-10-CM | POA: Diagnosis not present

## 2023-04-12 ENCOUNTER — Ambulatory Visit (INDEPENDENT_AMBULATORY_CARE_PROVIDER_SITE_OTHER): Payer: Medicare Other | Admitting: Internal Medicine

## 2023-04-12 ENCOUNTER — Encounter: Payer: Self-pay | Admitting: Internal Medicine

## 2023-04-12 ENCOUNTER — Other Ambulatory Visit: Payer: Self-pay | Admitting: Internal Medicine

## 2023-04-12 VITALS — BP 128/68 | HR 76 | Ht 65.0 in | Wt 173.0 lb

## 2023-04-12 DIAGNOSIS — I10 Essential (primary) hypertension: Secondary | ICD-10-CM

## 2023-04-12 DIAGNOSIS — Z23 Encounter for immunization: Secondary | ICD-10-CM | POA: Diagnosis not present

## 2023-04-12 NOTE — Progress Notes (Deleted)
Date:  04/12/2023   Name:  Monique Hall   DOB:  [birth year]   MRN:  [id number]   Chief Complaint: No chief complaint on file.  Hypertension This is a chronic problem. The problem is controlled. Pertinent negatives include no chest pain, headaches, palpitations or shortness of breath. Past treatments include angiotensin blockers and diuretics. The current treatment provides significant improvement.  Hyperlipidemia This is a chronic problem. The problem is uncontrolled. Pertinent negatives include no chest pain or shortness of breath. Current antihyperlipidemic treatment includes ezetimibe.  Diabetes She presents for her follow-up diabetic visit. Diabetes type: prediabaetes. Pertinent negatives for hypoglycemia include no dizziness or headaches. Pertinent negatives for diabetes include no chest pain, no fatigue and no weakness.    Lab Results  Component Value Date   NA 142 10/07/2022   K 4.5 10/07/2022   CO2 23 10/07/2022   GLUCOSE 98 10/07/2022   BUN 16 10/07/2022   CREATININE 0.92 10/07/2022   CALCIUM 11.0 (H) 10/07/2022   EGFR 63 10/07/2022   GFRNONAA 55 (L) 09/20/2019   Lab Results  Component Value Date   CHOL 249 (H) 10/07/2022   HDL 57 10/07/2022   LDLCALC 149 (H) 10/07/2022   TRIG 237 (H) 10/07/2022   CHOLHDL 4.4 10/07/2022   Lab Results  Component Value Date   TSH 2.060 10/07/2022   Lab Results  Component Value Date   HGBA1C 5.8 (H) 10/07/2022   Lab Results  Component Value Date   WBC 6.6 10/07/2022   HGB 12.2 10/07/2022   HCT 37.9 10/07/2022   MCV 95 10/07/2022   PLT 367 10/07/2022   Lab Results  Component Value Date   ALT 24 10/07/2022   AST 23 10/07/2022   ALKPHOS 58 10/07/2022   BILITOT 0.9 10/07/2022   No results found for: "25OHVITD2", "25OHVITD3", "VD25OH"   Review of Systems  Constitutional:  Negative for fatigue and unexpected weight change.  HENT:  Negative for nosebleeds.   Eyes:  Negative for visual disturbance.  Respiratory:   Negative for cough, chest tightness, shortness of breath and wheezing.   Cardiovascular:  Negative for chest pain, palpitations and leg swelling.  Gastrointestinal:  Negative for abdominal pain, constipation and diarrhea.  Neurological:  Negative for dizziness, weakness, light-headedness and headaches.    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) 10/23/2020   Shoulder pain, bilateral 01/24/2020   Aortic atherosclerosis (HCC) 09/15/2018   Centrilobular emphysema (HCC) 09/15/2018   Thyroid nodule 10/06/2017   Gastroesophageal reflux disease without esophagitis 09/02/2016   Essential hypertension 07/29/2015   Mixed hyperlipidemia 07/29/2015   Menopausal symptoms 07/29/2015   Tobacco use disorder, moderate, in sustained remission 07/29/2015   Degeneration of intervertebral disc of lumbar region 06/17/2015   Neuritis 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 02/05/2015    Allergies  Allergen Reactions   Baclofen     Other reaction(s): Unknown   Statins Other (See Comments)    myalgia   Etodolac Nausea Only    Past Surgical History:  Procedure Laterality Date   ABDOMINAL HYSTERECTOMY  1985   total for bleeding   APPENDECTOMY     BIOPSY THYROID  2019   BLADDER SUSPENSION  2004   BREAST BIOPSY Left 10/09/2020   [place] Bx, Q-clip, IMC   BREAST LUMPECTOMY,RADIO FREQ LOCALIZER,AXILLARY SENTINEL LYMPH NODE BIOPSY Left 11/06/2020   INVASIVE MAMMARY CARCINOMA, NO SPECIAL  TYPE BREAST LUMPECTOMY,RADIO FREQ LOCALIZER,AXILLARY SENTINEL LYMPH NODE BIOPSY;  Surgeon: Sung Amabile, DO;  Location: [facility] ORS;  Service: General;  Laterality: Left;   lipoma excision  03/2018   posterior neck    Social History   Tobacco Use   Smoking status: Former    Current packs/day: 0.00    Average packs/day: 1 pack/day for 35.0 years (35.0 ttl pk-yrs)    Types: Cigarettes    Start  date: 09/02/1967    Quit date: 09/01/2002    Years since quitting: 20.6   Smokeless tobacco: Never   Tobacco comments:    smoking cessation materials not required  Vaping Use   Vaping status: Never Used  Substance Use Topics   Alcohol use: Yes    Alcohol/week: 0.0 standard drinks of alcohol    Comment: RARE   Drug use: Never     Medication list has been reviewed and updated.  No outpatient medications have been marked as taking for the 04/12/23 encounter (Orders Only) with Reubin Milan, MD.       04/02/2022    9:41 AM 10/01/2021    8:56 AM 04/02/2021    9:22 AM 09/25/2020    8:11 AM  GAD 7 : Generalized Anxiety Score  Nervous, Anxious, on Edge 0 0 0 0  Control/stop worrying 0 0 0 0  Worry too much - different things 0 0 0 0  Trouble relaxing 0 0 0 0  Restless 0 0 0 0  Easily annoyed or irritable 0 0 0 0  Afraid - awful might happen 0 0 0 0  Total GAD 7 Score 0 0 0 0  Anxiety Difficulty Not difficult at all Not difficult at all         10/07/2022    8:46 AM 04/02/2022    9:41 AM 10/01/2021    8:56 AM  Depression screen PHQ 2/9  Decreased Interest 0 0 0  Down, Depressed, Hopeless 0 0 0  PHQ - 2 Score 0 0 0  Altered sleeping 0 1 0  Tired, decreased energy 0 0 0  Change in appetite 0 0 0  Feeling bad or failure about yourself  0 0 0  Trouble concentrating 0 0 0  Moving slowly or fidgety/restless 0 0 0  Suicidal thoughts 0 0 0  PHQ-9 Score 0 1 0  Difficult doing work/chores Not difficult at all Not difficult at all Not difficult at all    BP Readings from Last 3 Encounters:  01/13/23 (!) 149/86  10/07/22 128/76  10/07/22 128/76    Physical Exam Vitals and nursing note reviewed.  Constitutional:      General: She is not in acute distress.    Appearance: She is well-developed.  HENT:     Head: Normocephalic and atraumatic.  Pulmonary:     Effort: Pulmonary effort is normal. No respiratory distress.  Skin:    General: Skin is warm and dry.     Findings:  No rash.  Neurological:     Mental Status: She is alert and oriented to person, place, and time.  Psychiatric:        Mood and Affect: Mood normal.        Behavior: Behavior normal.     Wt Readings from Last 3 Encounters:  01/13/23 173 lb (78.5 kg)  10/07/22 174 lb 9.6 oz (79.2 kg)  10/07/22 174 lb 9.6 oz (79.2 kg)    There were no vitals taken for this visit.  Assessment and Plan:  Problem List Items Addressed This Visit       Unprioritized   Essential hypertension - Primary (Chronic)    Normal exam with stable BP on losartan hct. No concerns or side effects to current medication. No change in regimen; continue low sodium diet.       Other Visit Diagnoses     Hypercalcemia           No follow-ups on file.    Reubin Milan, MD [facility] [place]

## 2023-04-12 NOTE — Assessment & Plan Note (Signed)
Normal exam with stable BP on losartan hct. No concerns or side effects to current medication. No change in regimen; continue low sodium diet.

## 2023-04-12 NOTE — Assessment & Plan Note (Signed)
May be due to diuretic use. Will get PTH and repeat labs Consider stopping hydrochlorothiazide and supplements

## 2023-04-12 NOTE — Progress Notes (Signed)
Date:  04/12/2023   Name:  Monique Hall   DOB:  July 15, 1941   MRN:  [id number]   Chief Complaint: Hypertension  Hypertension This is a chronic problem. The problem is controlled. Pertinent negatives include no chest pain, headaches, palpitations or shortness of breath. Past treatments include angiotensin blockers and diuretics. The current treatment provides significant improvement.    Lab Results  Component Value Date   NA 142 10/07/2022   K 4.5 10/07/2022   CO2 23 10/07/2022   GLUCOSE 98 10/07/2022   BUN 16 10/07/2022   CREATININE 0.92 10/07/2022   CALCIUM 11.0 (H) 10/07/2022   EGFR 63 10/07/2022   GFRNONAA 55 (L) 09/20/2019   Lab Results  Component Value Date   CHOL 249 (H) 10/07/2022   HDL 57 10/07/2022   LDLCALC 149 (H) 10/07/2022   TRIG 237 (H) 10/07/2022   CHOLHDL 4.4 10/07/2022   Lab Results  Component Value Date   TSH 2.060 10/07/2022   Lab Results  Component Value Date   HGBA1C 5.8 (H) 10/07/2022   Lab Results  Component Value Date   WBC 6.6 10/07/2022   HGB 12.2 10/07/2022   HCT 37.9 10/07/2022   MCV 95 10/07/2022   PLT 367 10/07/2022   Lab Results  Component Value Date   ALT 24 10/07/2022   AST 23 10/07/2022   ALKPHOS 58 10/07/2022   BILITOT 0.9 10/07/2022   No results found for: "25OHVITD2", "25OHVITD3", "VD25OH"   Review of Systems  Constitutional:  Negative for appetite change, fatigue, fever and unexpected weight change.  HENT:  Negative for trouble swallowing.   Eyes:  Negative for visual disturbance.  Respiratory:  Negative for cough, chest tightness, shortness of breath and wheezing.   Cardiovascular:  Negative for chest pain, palpitations and leg swelling.  Gastrointestinal:  Negative for abdominal pain, constipation and diarrhea.  Genitourinary:  Negative for dysuria and hematuria.  Musculoskeletal:  Negative for arthralgias.  Neurological:  Negative for dizziness, tremors, weakness, light-headedness, numbness and headaches.   Psychiatric/Behavioral:  Negative for dysphoric mood and sleep disturbance. The patient is not nervous/anxious.     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) 10/23/2020   Shoulder pain, bilateral 01/24/2020   Aortic atherosclerosis (HCC) 09/15/2018   Centrilobular emphysema (HCC) 09/15/2018   Thyroid nodule 10/06/2017   Gastroesophageal reflux disease without esophagitis 09/02/2016   Essential hypertension 07/29/2015   Mixed hyperlipidemia 07/29/2015   Menopausal symptoms 07/29/2015   Tobacco use disorder, moderate, in sustained remission 07/29/2015   Degeneration of intervertebral disc of lumbar region 06/17/2015   Neuritis 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 02/05/2015    Allergies  Allergen Reactions   Baclofen     Other reaction(s): Unknown   Statins Other (See Comments)    myalgia   Etodolac Nausea Only    Past Surgical History:  Procedure Laterality Date   ABDOMINAL HYSTERECTOMY  1985   total for bleeding   APPENDECTOMY     BIOPSY THYROID  2019   BLADDER SUSPENSION  2004   BREAST BIOPSY Left 10/09/2020   [place] Bx, Q-clip, IMC   BREAST LUMPECTOMY,RADIO FREQ LOCALIZER,AXILLARY SENTINEL LYMPH NODE BIOPSY Left 11/06/2020   INVASIVE MAMMARY CARCINOMA, NO SPECIAL TYPE BREAST LUMPECTOMY,RADIO FREQ LOCALIZER,AXILLARY SENTINEL LYMPH NODE BIOPSY;  Surgeon: Sung Amabile, DO;  Location: [facility] ORS;  Service: General;  Laterality: Left;  lipoma excision  03/2018   posterior neck    Social History   Tobacco Use   Smoking status: Former    Current packs/day: 0.00    Average packs/day: 1 pack/day for 35.0 years (35.0 ttl pk-yrs)    Types: Cigarettes    Start date: 09/02/1967    Quit date: 09/01/2002    Years since quitting: 20.6   Smokeless tobacco: Never   Tobacco comments:    smoking cessation materials not required   Vaping Use   Vaping status: Never Used  Substance Use Topics   Alcohol use: Yes    Alcohol/week: 0.0 standard drinks of alcohol    Comment: RARE   Drug use: Never     Medication list has been reviewed and updated.  Current Meds  Medication Sig   acetaminophen (TYLENOL) 500 MG tablet Take 1 tablet (500 mg total) by mouth every 6 (six) hours as needed for mild pain or moderate pain. For shoulder pain   aspirin EC 81 MG tablet Take 81 mg by mouth daily.   Black Elderberry (SAMBUCUS ELDERBERRY PO) Take 1 each by mouth daily. gummy   calcium-vitamin D (OSCAL WITH D) 500-200 MG-UNIT tablet Take 1 tablet by mouth daily.   ezetimibe (ZETIA) 10 MG tablet TAKE 1 TABLET EVERY DAY   ibuprofen (ADVIL) 200 MG tablet Take 400 mg by mouth every 8 (eight) hours as needed for moderate pain.   letrozole (FEMARA) 2.5 MG tablet TAKE 1 TABLET EVERY DAY   loratadine (CLARITIN) 10 MG tablet Take 10 mg by mouth daily.   losartan-hydrochlorothiazide (HYZAAR) 100-12.5 MG tablet TAKE 1 TABLET EVERY DAY   Multiple Vitamin (MULTIVITAMIN) tablet Take 1 tablet by mouth daily.       04/12/2023   10:39 AM 04/02/2022    9:41 AM 10/01/2021    8:56 AM 04/02/2021    9:22 AM  GAD 7 : Generalized Anxiety Score  Nervous, Anxious, on Edge 0 0 0 0  Control/stop worrying 0 0 0 0  Worry too much - different things 0 0 0 0  Trouble relaxing 0 0 0 0  Restless 0 0 0 0  Easily annoyed or irritable 0 0 0 0  Afraid - awful might happen 0 0 0 0  Total GAD 7 Score 0 0 0 0  Anxiety Difficulty Not difficult at all Not difficult at all Not difficult at all        04/12/2023   10:39 AM 10/07/2022    8:46 AM 04/02/2022    9:41 AM  Depression screen PHQ 2/9  Decreased Interest 0 0 0  Down, Depressed, Hopeless 0 0 0  PHQ - 2 Score 0 0 0  Altered sleeping 0 0 1  Tired, decreased energy 0 0 0  Change in appetite 1 0 0  Feeling bad or failure about yourself  0 0 0  Trouble concentrating 0 0 0  Moving slowly or  fidgety/restless 0 0 0  Suicidal thoughts 0 0 0  PHQ-9 Score 1 0 1  Difficult doing work/chores Not difficult at all Not difficult at all Not difficult at all    BP Readings from Last 3 Encounters:  04/12/23 128/68  01/13/23 (!) 149/86  10/07/22 128/76    Physical Exam Vitals and nursing note reviewed.  Constitutional:      General: She is not in acute distress.    Appearance: She is well-developed.  HENT:     Head: Normocephalic and atraumatic.     Right  Ear: Tympanic membrane and ear canal normal.     Left Ear: Tympanic membrane and ear canal normal.     Nose:     Right Sinus: No maxillary sinus tenderness.     Left Sinus: No maxillary sinus tenderness.  Eyes:     General: No scleral icterus.       Right eye: No discharge.        Left eye: No discharge.     Conjunctiva/sclera: Conjunctivae normal.  Neck:     Thyroid: No thyromegaly.     Vascular: No carotid bruit.  Cardiovascular:     Rate and Rhythm: Normal rate and regular rhythm.     Pulses: Normal pulses.     Heart sounds: Normal heart sounds.  Pulmonary:     Effort: Pulmonary effort is normal. No respiratory distress.     Breath sounds: No wheezing.  Chest:  Breasts:    Right: No mass, nipple discharge, skin change or tenderness.     Left: No mass, nipple discharge, skin change or tenderness.  Abdominal:     General: Bowel sounds are normal.     Palpations: Abdomen is soft.     Tenderness: There is no abdominal tenderness.  Musculoskeletal:     Cervical back: Normal range of motion. No erythema.     Right lower leg: No edema.     Left lower leg: No edema.  Lymphadenopathy:     Cervical: No cervical adenopathy.  Skin:    General: Skin is warm and dry.     Findings: No rash.  Neurological:     Mental Status: She is alert and oriented to person, place, and time.     Cranial Nerves: No cranial nerve deficit.     Sensory: No sensory deficit.     Deep Tendon Reflexes: Reflexes are normal and symmetric.   Psychiatric:        Attention and Perception: Attention normal.        Mood and Affect: Mood normal.     Wt Readings from Last 3 Encounters:  04/12/23 173 lb (78.5 kg)  01/13/23 173 lb (78.5 kg)  10/07/22 174 lb 9.6 oz (79.2 kg)    BP 128/68   Pulse 76   Ht 5\' 5"  (1.651 m)   Wt 173 lb (78.5 kg)   SpO2 97%   BMI 28.79 kg/m   Assessment and Plan:  Problem List Items Addressed This Visit       Unprioritized   Hypercalcemia    May be due to diuretic use. Will get PTH and repeat labs Consider stopping hydrochlorothiazide and supplements      Relevant Orders   Basic metabolic panel   Parathyroid hormone, intact (no Ca)   Essential hypertension - Primary (Chronic)    Normal exam with stable BP on losartan hct. No concerns or side effects to current medication. No change in regimen; continue low sodium diet.      Relevant Orders   Basic metabolic panel   Other Visit Diagnoses     Need for influenza vaccination       Relevant Orders   Flu Vaccine Trivalent High Dose (Fluad) (Completed)       Return in about 6 months (around 10/10/2023) for Medicare annual.    Reubin Milan, MD [facility] [place]

## 2023-04-12 NOTE — Assessment & Plan Note (Signed)
 Normal exam with stable BP on losartan hct. No concerns or side effects to current medication. No change in regimen; continue low sodium diet.

## 2023-04-13 LAB — BASIC METABOLIC PANEL
BUN/Creatinine Ratio: 17 (ref 12–28)
BUN: 17 mg/dL (ref 8–27)
CO2: 23 mmol/L (ref 20–29)
Calcium: 10.5 mg/dL — ABNORMAL HIGH (ref 8.7–10.3)
Chloride: 102 mmol/L (ref 96–106)
Creatinine, Ser: 1 mg/dL (ref 0.57–1.00)
Glucose: 99 mg/dL (ref 70–99)
Potassium: 4.5 mmol/L (ref 3.5–5.2)
Sodium: 141 mmol/L (ref 134–144)
eGFR: 57 mL/min/{1.73_m2} — ABNORMAL LOW (ref >59)

## 2023-04-13 LAB — PARATHYROID HORMONE, INTACT (NO CA): PTH: 13 pg/mL — ABNORMAL LOW (ref 15–65)

## 2023-04-15 ENCOUNTER — Other Ambulatory Visit: Payer: Self-pay | Admitting: Internal Medicine

## 2023-04-15 ENCOUNTER — Encounter: Payer: Self-pay | Admitting: Internal Medicine

## 2023-04-15 DIAGNOSIS — I10 Essential (primary) hypertension: Secondary | ICD-10-CM

## 2023-04-15 MED ORDER — LOSARTAN POTASSIUM 100 MG PO TABS: 100.0000 mg | ORAL_TABLET | Freq: Every day | ORAL | 3 refills | Status: DC

## 2023-05-05 DIAGNOSIS — H401431 Capsular glaucoma with pseudoexfoliation of lens, bilateral, mild stage: Secondary | ICD-10-CM | POA: Diagnosis not present

## 2023-05-05 DIAGNOSIS — H2513 Age-related nuclear cataract, bilateral: Secondary | ICD-10-CM | POA: Diagnosis not present

## 2023-05-10 ENCOUNTER — Telehealth: Payer: Medicare Other | Admitting: Oncology

## 2023-05-13 ENCOUNTER — Inpatient Hospital Stay: Payer: Medicare Other | Attending: Oncology | Admitting: Oncology

## 2023-05-13 DIAGNOSIS — C50212 Malignant neoplasm of upper-inner quadrant of left female breast: Secondary | ICD-10-CM | POA: Diagnosis not present

## 2023-05-13 DIAGNOSIS — Z170 Estrogen receptor positive status [ER+]: Secondary | ICD-10-CM | POA: Diagnosis not present

## 2023-05-13 MED ORDER — ALENDRONATE SODIUM 70 MG PO TABS: 70.0000 mg | ORAL_TABLET | ORAL | 3 refills | Status: DC

## 2023-05-13 NOTE — Progress Notes (Signed)
[facility]  Telephone:([phone]) [phone] Fax:([phone]) [phone]  ID: HIDEKO HORNSTEIN OB: 1940-09-16  MR#: [id number]  OZD#:[id number]  Patient Care Team: Reubin Milan, MD as PCP - General (Internal Medicine) Merri Ray, MD as Referring Physician (Physical Medicine and Rehabilitation) Jim Like, [name] Orlie Dakin, Tollie Pizza, MD as Consulting Physician (Oncology) Carmina Miller, MD as Consulting Physician (Radiation Oncology) Armando Reichert, MD as Referring Physician (Cardiology)  I connected with [name] on 05/13/23 at 10:45 AM EDT by video enabled telemedicine visit and verified that I am speaking with the correct person using two identifiers.   I discussed the limitations, risks, security and privacy concerns of performing an evaluation and management service by telemedicine and the availability of in-person appointments. I also discussed with the patient that there may be a patient responsible charge related to this service. The patient expressed understanding and agreed to proceed.   Other persons participating in the visit and their role in the encounter: Patient, MD.  Patient's location: Home. Provider's location: Clinic.   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  INTERVAL HISTORY: Patient agreed to video-assisted telemedicine visit for her routine 36-month evaluation.  She continues to feel well and remains asymptomatic.  She is tolerating letrozole without significant side effects. She has no neurologic complaints.  She denies any recent fevers or illnesses.  She has a good appetite and denies weight loss.  She has no chest pain, shortness of breath, cough, or hemoptysis.  She denies any nausea, vomiting, constipation, or diarrhea.  She has no urinary complaints.  Patient offers no specific complaints today.  REVIEW OF SYSTEMS:    Review of Systems  Constitutional: Negative.  Negative for fever, malaise/fatigue and weight loss.  Respiratory: Negative.  Negative for cough, hemoptysis and shortness of breath.   Cardiovascular: Negative.  Negative for chest pain and leg swelling.  Gastrointestinal: Negative.  Negative for abdominal pain.  Genitourinary: Negative.  Negative for dysuria.  Musculoskeletal: Negative.  Negative for back pain.  Skin: Negative.  Negative for rash.  Neurological: Negative.  Negative for dizziness, focal weakness, weakness and headaches.  Psychiatric/Behavioral: Negative.  The patient is not nervous/anxious.     As per HPI. Otherwise, a complete review of systems is negative.  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/2020   Esophagitis    Gastritis    Gastroesophageal reflux disease without esophagitis    History of kidney stones    Hypercholesteremia    Hypertension    Lipoma of neck 04/27/2018   Personal history of radiation therapy    Sciatica    Tendonitis, Achilles 2016   both    Thyroid nodule 2019    PAST SURGICAL HISTORY: Past Surgical History:  Procedure Laterality Date   ABDOMINAL HYSTERECTOMY  1985   total for bleeding   APPENDECTOMY     BIOPSY THYROID  2019   BLADDER SUSPENSION  2004   BREAST BIOPSY Left 10/09/2020   [place] Bx, Q-clip, IMC   BREAST LUMPECTOMY,RADIO FREQ LOCALIZER,AXILLARY SENTINEL LYMPH NODE BIOPSY Left 11/06/2020   INVASIVE MAMMARY CARCINOMA, NO SPECIAL TYPE BREAST LUMPECTOMY,RADIO FREQ LOCALIZER,AXILLARY SENTINEL LYMPH NODE BIOPSY;  Surgeon: Sung Amabile, DO;  Location: [facility] ORS;  Service: General;  Laterality: Left;   lipoma excision  03/2018  posterior neck    FAMILY HISTORY: Family History  Problem Relation Age of Onset   CAD Mother    Diabetes Mother    Arthritis Mother    Hypertension Mother    Parkinson's disease  Father    COPD Father    Hearing loss Father    Breast cancer Neg Hx     ADVANCED DIRECTIVES (Y/N):  N  HEALTH MAINTENANCE: Social History   Tobacco Use   Smoking status: Former    Current packs/day: 0.00    Average packs/day: 1 pack/day for 35.0 years (35.0 ttl pk-yrs)    Types: Cigarettes    Start date: 09/02/1967    Quit date: 09/01/2002    Years since quitting: 20.7   Smokeless tobacco: Never   Tobacco comments:    smoking cessation materials not required  Vaping Use   Vaping status: Never Used  Substance Use Topics   Alcohol use: Yes    Alcohol/week: 0.0 standard drinks of alcohol    Comment: RARE   Drug use: Never     Colonoscopy:  PAP:  Bone density:  Lipid panel:  Allergies  Allergen Reactions   Hydrochlorothiazide Other (See Comments)    hypercalcemia   Baclofen     Other reaction(s): Unknown   Statins Other (See Comments)    myalgia   Etodolac Nausea Only    Current Outpatient Medications  Medication Sig Dispense Refill   alendronate (FOSAMAX) 70 MG tablet Take 1 tablet (70 mg total) by mouth once a week. Take with a full glass of water on an empty stomach. 12 tablet 3   acetaminophen (TYLENOL) 500 MG tablet Take 1 tablet (500 mg total) by mouth every 6 (six) hours as needed for mild pain or moderate pain. For shoulder pain     aspirin EC 81 MG tablet Take 81 mg by mouth daily.     Black Elderberry (SAMBUCUS ELDERBERRY PO) Take 1 each by mouth daily. gummy     calcium-vitamin D (OSCAL WITH D) 500-200 MG-UNIT tablet Take 1 tablet by mouth daily.     ezetimibe (ZETIA) 10 MG tablet TAKE 1 TABLET EVERY DAY 90 tablet 3   ibuprofen (ADVIL) 200 MG tablet Take 400 mg by mouth every 8 (eight) hours as needed for moderate pain.     letrozole (FEMARA) 2.5 MG tablet TAKE 1 TABLET EVERY DAY 90 tablet 3   loratadine (CLARITIN) 10 MG tablet Take 10 mg by mouth daily.     losartan (COZAAR) 100 MG tablet Take 1 tablet (100 mg total) by mouth daily. 90 tablet 3    Multiple Vitamin (MULTIVITAMIN) tablet Take 1 tablet by mouth daily.     No current facility-administered medications for this visit.    OBJECTIVE: There were no vitals filed for this visit.    There is no height or weight on file to calculate BMI.    ECOG FS:0 - Asymptomatic  General: Well-developed, well-nourished, no acute distress. HEENT: Normocephalic. Neuro: Alert, answering all questions appropriately. Cranial nerves grossly intact. Psych: Normal affect.  LAB RESULTS:  Lab Results  Component Value Date   NA 141 04/12/2023   K 4.5 04/12/2023   CL 102 04/12/2023   CO2 23 04/12/2023   GLUCOSE 99 04/12/2023   BUN 17 04/12/2023   CREATININE 1.00 04/12/2023   CALCIUM 10.5 (H) 04/12/2023   PROT 7.2 10/07/2022   ALBUMIN 4.5 10/07/2022   AST 23 10/07/2022   ALT 24 10/07/2022   ALKPHOS 58 10/07/2022  BILITOT 0.9 10/07/2022   GFRNONAA 55 (L) 09/20/2019   GFRAA 63 09/20/2019    Lab Results  Component Value Date   WBC 6.6 10/07/2022   NEUTROABS 3.0 10/07/2022   HGB 12.2 10/07/2022   HCT 37.9 10/07/2022   MCV 95 10/07/2022   PLT 367 10/07/2022     STUDIES: No results found.   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  She completed adjuvant XRT on December 26, 2020.  Continue letrozole for a total of 5 years completing treatment in June 2027.  Patient's most recent mammogram on October 18, 2022 was reported as BI-RADS 2.  Repeat mammogram in April 2025.  Patient will then follow-up 2 to 3 days later with video-assisted telemedicine visit for further evaluation Osteoporosis: Patient's most recent bone mineral density on January 17, 2023 reported  T-score of -2.5.  She is now considered osteoporotic and is given a prescription for Fosamax once weekly.  She has been instructed to continue calcium and vitamin D supplementation.  Repeat bone mineral density in July 2025.   Hypocalcemia: Patient reports her primary care switched her blood pressure medications.  Fosamax as above.  I provided 20 minutes of face-to-face video visit time during this encounter which included chart review, counseling, and coordination of care as documented above.    Patient expressed understanding and was in agreement with this plan. She also understands that She can call clinic at any time with any questions, concerns, or complaints.    Cancer Staging  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 [facility]) Staging form: Breast, AJCC 8th Edition - Pathologic stage from 11/13/2020: Stage IA (pT1a, pN0, cM0, G1, ER+, PR+, HER2-) - Signed by Jeralyn Ruths, MD on 11/13/2020 Stage prefix: Initial diagnosis Histologic grading system: 3 grade system   Jeralyn Ruths, MD   05/13/2023 12:13 PM

## 2023-06-12 ENCOUNTER — Other Ambulatory Visit: Payer: Self-pay | Admitting: Internal Medicine

## 2023-06-12 DIAGNOSIS — E782 Mixed hyperlipidemia: Secondary | ICD-10-CM

## 2023-06-14 NOTE — Telephone Encounter (Signed)
Requested Prescriptions  Pending Prescriptions Disp Refills   ezetimibe (ZETIA) 10 MG tablet [Pharmacy Med Name: Ezetimibe Oral Tablet 10 MG] 90 tablet 0    Sig: TAKE 1 TABLET EVERY DAY     Cardiovascular:  Antilipid - Sterol Transport Inhibitors Failed - 06/12/2023  8:12 PM      Failed - Lipid Panel in normal range within the last 12 months    Cholesterol, Total  Date Value Ref Range Status  10/07/2022 249 (H) 100 - 199 mg/dL Final   LDL Chol Calc (NIH)  Date Value Ref Range Status  10/07/2022 149 (H) 0 - 99 mg/dL Final   HDL  Date Value Ref Range Status  10/07/2022 57 >39 mg/dL Final   Triglycerides  Date Value Ref Range Status  10/07/2022 237 (H) 0 - 149 mg/dL Final         Passed - AST in normal range and within 360 days    AST  Date Value Ref Range Status  10/07/2022 23 0 - 40 IU/L Final         Passed - ALT in normal range and within 360 days    ALT  Date Value Ref Range Status  10/07/2022 24 0 - 32 IU/L Final         Passed - Patient is not pregnant      Passed - Valid encounter within last 12 months    Recent Outpatient Visits           2 months ago Essential hypertension   [place] [facility] at [facility], Nyoka Cowden, MD   8 months ago Essential hypertension   [place] [facility] at [facility], Nyoka Cowden, MD   1 year ago Essential hypertension   [place] [facility] at [facility], Nyoka Cowden, MD   1 year ago COVID-19 virus infection   [facility] at [facility], Nyoka Cowden, MD   1 year ago Essential hypertension   [place] [facility] at [facility], Nyoka Cowden, MD       Future Appointments             In 3 months Judithann Graves, Nyoka Cowden, MD [facility] at [facility], [facility]

## 2023-07-28 DIAGNOSIS — R9431 Abnormal electrocardiogram [ECG] [EKG]: Secondary | ICD-10-CM | POA: Diagnosis not present

## 2023-07-28 DIAGNOSIS — R0609 Other forms of dyspnea: Secondary | ICD-10-CM | POA: Diagnosis not present

## 2023-07-28 DIAGNOSIS — E785 Hyperlipidemia, unspecified: Secondary | ICD-10-CM | POA: Diagnosis not present

## 2023-07-28 DIAGNOSIS — I10 Essential (primary) hypertension: Secondary | ICD-10-CM | POA: Diagnosis not present

## 2023-07-28 DIAGNOSIS — E782 Mixed hyperlipidemia: Secondary | ICD-10-CM | POA: Diagnosis not present

## 2023-07-28 DIAGNOSIS — I2510 Atherosclerotic heart disease of native coronary artery without angina pectoris: Secondary | ICD-10-CM | POA: Diagnosis not present

## 2023-07-28 DIAGNOSIS — Z87891 Personal history of nicotine dependence: Secondary | ICD-10-CM | POA: Diagnosis not present

## 2023-07-28 DIAGNOSIS — Z789 Other specified health status: Secondary | ICD-10-CM | POA: Diagnosis not present

## 2023-07-28 DIAGNOSIS — I700 Atherosclerosis of aorta: Secondary | ICD-10-CM | POA: Diagnosis not present

## 2023-07-29 ENCOUNTER — Other Ambulatory Visit: Payer: Self-pay | Admitting: *Deleted

## 2023-07-29 MED ORDER — ALENDRONATE SODIUM 70 MG PO TABS: 70.0000 mg | ORAL_TABLET | ORAL | 3 refills | Status: DC

## 2023-08-18 DIAGNOSIS — R0609 Other forms of dyspnea: Secondary | ICD-10-CM | POA: Diagnosis not present

## 2023-08-18 DIAGNOSIS — I2510 Atherosclerotic heart disease of native coronary artery without angina pectoris: Secondary | ICD-10-CM | POA: Diagnosis not present

## 2023-08-25 DIAGNOSIS — Z789 Other specified health status: Secondary | ICD-10-CM | POA: Diagnosis not present

## 2023-08-25 DIAGNOSIS — R9431 Abnormal electrocardiogram [ECG] [EKG]: Secondary | ICD-10-CM | POA: Diagnosis not present

## 2023-08-25 DIAGNOSIS — I10 Essential (primary) hypertension: Secondary | ICD-10-CM | POA: Diagnosis not present

## 2023-08-25 DIAGNOSIS — Z87891 Personal history of nicotine dependence: Secondary | ICD-10-CM | POA: Diagnosis not present

## 2023-08-25 DIAGNOSIS — I2510 Atherosclerotic heart disease of native coronary artery without angina pectoris: Secondary | ICD-10-CM | POA: Diagnosis not present

## 2023-08-25 DIAGNOSIS — R0609 Other forms of dyspnea: Secondary | ICD-10-CM | POA: Diagnosis not present

## 2023-08-25 DIAGNOSIS — I700 Atherosclerosis of aorta: Secondary | ICD-10-CM | POA: Diagnosis not present

## 2023-08-25 DIAGNOSIS — E785 Hyperlipidemia, unspecified: Secondary | ICD-10-CM | POA: Diagnosis not present

## 2023-09-05 ENCOUNTER — Other Ambulatory Visit: Payer: Self-pay | Admitting: Internal Medicine

## 2023-09-05 DIAGNOSIS — Z1231 Encounter for screening mammogram for malignant neoplasm of breast: Secondary | ICD-10-CM

## 2023-10-11 ENCOUNTER — Encounter: Payer: Self-pay | Admitting: Internal Medicine

## 2023-10-11 ENCOUNTER — Ambulatory Visit (INDEPENDENT_AMBULATORY_CARE_PROVIDER_SITE_OTHER): Payer: Medicare Other | Admitting: Internal Medicine

## 2023-10-11 VITALS — BP 124/68 | HR 87 | Ht 65.0 in | Wt 171.4 lb

## 2023-10-11 DIAGNOSIS — I700 Atherosclerosis of aorta: Secondary | ICD-10-CM | POA: Diagnosis not present

## 2023-10-11 DIAGNOSIS — M778 Other enthesopathies, not elsewhere classified: Secondary | ICD-10-CM | POA: Diagnosis not present

## 2023-10-11 DIAGNOSIS — C50212 Malignant neoplasm of upper-inner quadrant of left female breast: Secondary | ICD-10-CM

## 2023-10-11 DIAGNOSIS — E041 Nontoxic single thyroid nodule: Secondary | ICD-10-CM

## 2023-10-11 DIAGNOSIS — Z1231 Encounter for screening mammogram for malignant neoplasm of breast: Secondary | ICD-10-CM

## 2023-10-11 DIAGNOSIS — E782 Mixed hyperlipidemia: Secondary | ICD-10-CM | POA: Diagnosis not present

## 2023-10-11 DIAGNOSIS — Z170 Estrogen receptor positive status [ER+]: Secondary | ICD-10-CM

## 2023-10-11 DIAGNOSIS — R7303 Prediabetes: Secondary | ICD-10-CM | POA: Diagnosis not present

## 2023-10-11 DIAGNOSIS — J432 Centrilobular emphysema: Secondary | ICD-10-CM | POA: Diagnosis not present

## 2023-10-11 DIAGNOSIS — Z0000 Encounter for general adult medical examination without abnormal findings: Secondary | ICD-10-CM

## 2023-10-11 DIAGNOSIS — I10 Essential (primary) hypertension: Secondary | ICD-10-CM

## 2023-10-11 MED ORDER — LOSARTAN POTASSIUM 100 MG PO TABS: 100.0000 mg | ORAL_TABLET | Freq: Every day | ORAL | 3 refills | Status: AC

## 2023-10-11 MED ORDER — EZETIMIBE 10 MG PO TABS: 10.0000 mg | ORAL_TABLET | Freq: Every day | ORAL | 3 refills | Status: AC

## 2023-10-11 NOTE — Progress Notes (Signed)
 Date:  10/11/2023   Name:  Monique Hall   DOB:  [birth year]   MRN:  [id number]   Chief Complaint: Annual Exam and Hand Pain (Patient said she is having left wrist/hand pain, started having pain 1.5 weeks ago, no injury) Monique CIARAMITARO is a 83 y.o. female who presents today for her Complete Annual Exam. She feels fairly well. She reports exercising walks, 1 - 2 times a week. She reports she is sleeping fairly well. Breast complaints none.  Health Maintenance  Topic Date Due   Zoster (Shingles) Vaccine (1 of 2) [birth year]19/1960   Medicare Annual Wellness Visit  10/07/2023   COVID-19 Vaccine (3 - Pfizer risk series) 10/27/2023*   Mammogram  10/18/2023   DTaP/Tdap/Td vaccine (3 - Td or Tdap) 08/28/2025   Pneumonia Vaccine  Completed   Flu Shot  Completed   DEXA scan (bone density measurement)  Completed   HPV Vaccine  Aged Out   Hepatitis C Screening  Discontinued  *Topic was postponed. The date shown is not the original due date.     Hypertension This is a chronic problem. The problem is controlled. Pertinent negatives include no chest pain, headaches, palpitations or shortness of breath. Past treatments include angiotensin blockers. The current treatment provides significant improvement. There are no compliance problems.   Hyperlipidemia This is a chronic problem. The problem is uncontrolled. Recent lipid tests were reviewed and are high. Pertinent negatives include no chest pain, myalgias or shortness of breath. Current antihyperlipidemic treatment includes ezetimibe. The current treatment provides mild improvement of lipids.  Diabetes She presents for her follow-up diabetic visit. Diabetes type: prediabetes. Pertinent negatives for hypoglycemia include no dizziness, headaches or nervousness/anxiousness. Pertinent negatives for diabetes include no chest pain, no fatigue and no weakness.  Wrist Pain  The pain is present in the left wrist. This is a new problem. The current episode started in  the past 7 days. There has been no history of extremity trauma. The problem occurs constantly.  She believes that she strained her wrist putting a steering wheel cover on last week.  Review of Systems  Constitutional:  Negative for fatigue and unexpected weight change.  HENT:  Negative for trouble swallowing.   Eyes:  Negative for visual disturbance.  Respiratory:  Negative for cough, chest tightness, shortness of breath and wheezing.   Cardiovascular:  Negative for chest pain, palpitations and leg swelling.  Gastrointestinal:  Negative for abdominal pain, constipation and diarrhea.  Genitourinary:  Negative for frequency and urgency.  Musculoskeletal:  Positive for arthralgias and back pain. Negative for myalgias.  Skin:  Negative for color change and rash.  Allergic/Immunologic: Positive for environmental allergies.  Neurological:  Negative for dizziness, weakness, light-headedness and headaches.  Psychiatric/Behavioral:  Negative for dysphoric mood and sleep disturbance. The patient is not nervous/anxious.      Lab Results  Component Value Date   NA 141 04/12/2023   K 4.5 04/12/2023   CO2 23 04/12/2023   GLUCOSE 99 04/12/2023   BUN 17 04/12/2023   CREATININE 1.00 04/12/2023   CALCIUM 10.5 (H) 04/12/2023   EGFR 57 (L) 04/12/2023   GFRNONAA 55 (L) 09/20/2019   Lab Results  Component Value Date   CHOL 249 (H) 10/07/2022   HDL 57 10/07/2022   LDLCALC 149 (H) 10/07/2022   TRIG 237 (H) 10/07/2022   CHOLHDL 4.4 10/07/2022   Lab Results  Component Value Date   TSH 2.060 10/07/2022   Lab Results  Component Value Date  HGBA1C 5.8 (H) 10/07/2022   Lab Results  Component Value Date   WBC 6.6 10/07/2022   HGB 12.2 10/07/2022   HCT 37.9 10/07/2022   MCV 95 10/07/2022   PLT 367 10/07/2022   Lab Results  Component Value Date   ALT 24 10/07/2022   AST 23 10/07/2022   ALKPHOS 58 10/07/2022   BILITOT 0.9 10/07/2022   No results found for: "25OHVITD2", "25OHVITD3",  "VD25OH"   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) 10/23/2020   Shoulder pain, bilateral 01/24/2020   Aortic atherosclerosis (HCC) 09/15/2018   Centrilobular emphysema (HCC) 09/15/2018   Thyroid nodule 10/06/2017   Gastroesophageal reflux disease without esophagitis 09/02/2016   Essential hypertension 07/29/2015   Mixed hyperlipidemia 07/29/2015   Menopausal symptoms 07/29/2015   Tobacco use disorder, moderate, in sustained remission 07/29/2015   Degeneration of intervertebral disc of lumbar region 06/17/2015   Neuritis 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 02/05/2015    Allergies  Allergen Reactions   Hydrochlorothiazide Other (See Comments)    hypercalcemia   Baclofen     Other reaction(s): Unknown   Statins Other (See Comments)    myalgia   Etodolac Nausea Only    Past Surgical History:  Procedure Laterality Date   ABDOMINAL HYSTERECTOMY  1985   total for bleeding   APPENDECTOMY     BIOPSY THYROID  2019   BLADDER SUSPENSION  2004   BREAST BIOPSY Left 10/09/2020   [place] Bx, Q-clip, IMC   BREAST LUMPECTOMY,RADIO FREQ LOCALIZER,AXILLARY SENTINEL LYMPH NODE BIOPSY Left 11/06/2020   INVASIVE MAMMARY CARCINOMA, NO SPECIAL TYPE BREAST LUMPECTOMY,RADIO FREQ LOCALIZER,AXILLARY SENTINEL LYMPH NODE BIOPSY;  Surgeon: Sung Amabile, DO;  Location: [facility] ORS;  Service: General;  Laterality: Left;   lipoma excision  03/2018   posterior neck    Social History   Tobacco Use   Smoking status: Former    Current packs/day: 0.00    Average packs/day: 1 pack/day for 35.0 years (35.0 ttl pk-yrs)    Types: Cigarettes    Start date: 09/02/1967    Quit date: 09/01/2002    Years since quitting: 21.1   Smokeless tobacco: Never   Tobacco comments:    smoking cessation materials not required  Vaping Use   Vaping status: Never Used   Substance Use Topics   Alcohol use: Yes    Alcohol/week: 0.0 standard drinks of alcohol    Comment: RARE   Drug use: Never     Medication list has been reviewed and updated.  Current Meds  Medication Sig   acetaminophen (TYLENOL) 500 MG tablet Take 1 tablet (500 mg total) by mouth every 6 (six) hours as needed for mild pain or moderate pain. For shoulder pain   alendronate (FOSAMAX) 70 MG tablet Take 1 tablet (70 mg total) by mouth once a week. Take with a full glass of water on an empty stomach.   aspirin EC 81 MG tablet Take 81 mg by mouth daily.   Black Elderberry (SAMBUCUS ELDERBERRY PO) Take 1 each by mouth daily. gummy   calcium-vitamin D (OSCAL WITH D) 500-200 MG-UNIT tablet Take 1 tablet by mouth daily.   letrozole (FEMARA) 2.5 MG tablet TAKE 1 TABLET EVERY DAY   loratadine (CLARITIN) 10 MG tablet Take 10 mg by mouth daily.   Multiple Vitamin (MULTIVITAMIN) tablet Take 1 tablet by mouth daily.   [  DISCONTINUED] ezetimibe (ZETIA) 10 MG tablet TAKE 1 TABLET EVERY DAY   [DISCONTINUED] ibuprofen (ADVIL) 200 MG tablet Take 400 mg by mouth every 8 (eight) hours as needed for moderate pain.   [DISCONTINUED] losartan (COZAAR) 100 MG tablet Take 1 tablet (100 mg total) by mouth daily.       10/11/2023    8:19 AM 04/12/2023   10:39 AM 04/02/2022    9:41 AM 10/01/2021    8:56 AM  GAD 7 : Generalized Anxiety Score  Nervous, Anxious, on Edge 0 0 0 0  Control/stop worrying 0 0 0 0  Worry too much - different things 0 0 0 0  Trouble relaxing 0 0 0 0  Restless 0 0 0 0  Easily annoyed or irritable 0 0 0 0  Afraid - awful might happen 0 0 0 0  Total GAD 7 Score 0 0 0 0  Anxiety Difficulty Not difficult at all Not difficult at all Not difficult at all Not difficult at all       10/11/2023    8:19 AM 04/12/2023   10:39 AM 10/07/2022    8:46 AM  Depression screen PHQ 2/9  Decreased Interest 0 0 0  Down, Depressed, Hopeless 0 0 0  PHQ - 2 Score 0 0 0  Altered sleeping 0 0 0  Tired,  decreased energy 0 0 0  Change in appetite 0 1 0  Feeling bad or failure about yourself  0 0 0  Trouble concentrating 0 0 0  Moving slowly or fidgety/restless 0 0 0  Suicidal thoughts 0 0 0  PHQ-9 Score 0 1 0  Difficult doing work/chores Not difficult at all Not difficult at all Not difficult at all    BP Readings from Last 3 Encounters:  10/11/23 124/68  04/12/23 128/68  01/13/23 (!) 149/86    Physical Exam Vitals and nursing note reviewed.  Constitutional:      General: She is not in acute distress.    Appearance: She is well-developed.  HENT:     Head: Normocephalic and atraumatic.     Right Ear: Tympanic membrane and ear canal normal.     Left Ear: Tympanic membrane and ear canal normal.     Nose:     Right Sinus: No maxillary sinus tenderness.     Left Sinus: No maxillary sinus tenderness.  Eyes:     General: No scleral icterus.       Right eye: No discharge.        Left eye: No discharge.     Conjunctiva/sclera: Conjunctivae normal.  Neck:     Thyroid: No thyromegaly.     Vascular: No carotid bruit.  Cardiovascular:     Rate and Rhythm: Normal rate and regular rhythm.     Pulses: Normal pulses.     Heart sounds: Normal heart sounds.  Pulmonary:     Effort: Pulmonary effort is normal. No respiratory distress.     Breath sounds: No wheezing.  Abdominal:     General: Bowel sounds are normal.     Palpations: Abdomen is soft.     Tenderness: There is no abdominal tenderness.  Musculoskeletal:     Right wrist: Normal.     Left wrist: Snuff box tenderness present. No swelling or effusion. Normal range of motion.     Cervical back: Normal range of motion. No erythema.     Right lower leg: No edema.     Left lower leg: No edema.  Lymphadenopathy:  Cervical: No cervical adenopathy.  Skin:    General: Skin is warm and dry.     Findings: No rash.  Neurological:     Mental Status: She is alert and oriented to person, place, and time.     Cranial Nerves: No  cranial nerve deficit.     Sensory: No sensory deficit.     Deep Tendon Reflexes: Reflexes are normal and symmetric.  Psychiatric:        Attention and Perception: Attention normal.        Mood and Affect: Mood normal.     Wt Readings from Last 3 Encounters:  10/11/23 171 lb 6 oz (77.7 kg)  04/12/23 173 lb (78.5 kg)  01/13/23 173 lb (78.5 kg)    BP 124/68   Pulse 87   Ht 5\' 5"  (1.651 m)   Wt 171 lb 6 oz (77.7 kg)   SpO2 95%   BMI 28.52 kg/m   Assessment and Plan:  Problem List Items Addressed This Visit       Unprioritized   Essential hypertension (Chronic)   Blood pressure is well controlled.  Current medications losartan. Will continue same regimen along with efforts to limit dietary sodium.       Relevant Medications   ezetimibe (ZETIA) 10 MG tablet   losartan (COZAAR) 100 MG tablet   Other Relevant Orders   CBC with Differential/Platelet   Comprehensive metabolic panel   Urinalysis, Routine w reflex microscopic   Mixed hyperlipidemia (Chronic)   Relevant Medications   ezetimibe (ZETIA) 10 MG tablet   losartan (COZAAR) 100 MG tablet   Thyroid nodule (Chronic)   No symptoms of compression or dysphagia Will check labs      Relevant Orders   TSH + free T4   Aortic atherosclerosis (HCC) (Chronic)   On zetia since intolerant of statins Lab Results  Component Value Date   LDLCALC 149 (H) 10/07/2022         Relevant Medications   ezetimibe (ZETIA) 10 MG tablet   losartan (COZAAR) 100 MG tablet   Other Relevant Orders   Lipid panel   Centrilobular emphysema (HCC) (Chronic)   Mild shortness of breath with prolonged exertion. No chronic cough or sputum production. 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 Will recheck.      Relevant Orders   Comprehensive metabolic panel   Other Visit Diagnoses       Annual physical exam    -  Primary     Encounter for screening mammogram for breast cancer       scheduled     Prediabetes       Relevant Orders   Hemoglobin A1c     Left wrist tendonitis       continue to wear the brace when active. use ice and take Tylenol tid for one week follow up if needed      Handicapped parking application given. Return in about 6 months (around 04/12/2024) for HTN.    Reubin Milan, MD [facility] [place]

## 2023-10-11 NOTE — Assessment & Plan Note (Addendum)
 Mild shortness of breath with prolonged exertion. No chronic cough or sputum production. She declines offer of albuterol inhaler to use PRN

## 2023-10-11 NOTE — Assessment & Plan Note (Signed)
 Blood pressure is well controlled.  Current medications losartan Will continue same regimen along with efforts to limit dietary sodium.

## 2023-10-11 NOTE — Assessment & Plan Note (Signed)
 Likely due to hydrochlorothiazide - stopped 6 mo ago. Will recheck.

## 2023-10-11 NOTE — Assessment & Plan Note (Signed)
 No symptoms of compression or dysphagia Will check labs

## 2023-10-11 NOTE — Assessment & Plan Note (Signed)
 On zetia since intolerant of statins Lab Results  Component Value Date   LDLCALC 149 (H) 10/07/2022

## 2023-10-11 NOTE — Assessment & Plan Note (Signed)
 Followed by oncology Mammogram due On Femara without problems

## 2023-10-12 ENCOUNTER — Encounter: Payer: Self-pay | Admitting: Internal Medicine

## 2023-10-12 LAB — TSH+FREE T4
Free T4: 1.1 ng/dL (ref 0.82–1.77)
TSH: 2.2 u[IU]/mL (ref 0.450–4.500)

## 2023-10-12 LAB — MICROSCOPIC EXAMINATION
Bacteria, UA: NONE SEEN
Casts: NONE SEEN /LPF

## 2023-10-12 LAB — URINALYSIS, ROUTINE W REFLEX MICROSCOPIC
Bilirubin, UA: NEGATIVE
Glucose, UA: NEGATIVE
Ketones, UA: NEGATIVE
Nitrite, UA: NEGATIVE
RBC, UA: NEGATIVE
Specific Gravity, UA: 1.023 (ref 1.005–1.030)
Urobilinogen, Ur: 0.2 mg/dL (ref 0.2–1.0)
pH, UA: 6 (ref 5.0–7.5)

## 2023-10-12 LAB — HEMOGLOBIN A1C
Est. average glucose Bld gHb Est-mCnc: 120 mg/dL
Hgb A1c MFr Bld: 5.8 % — ABNORMAL HIGH (ref 4.8–5.6)

## 2023-10-12 LAB — LIPID PANEL
Chol/HDL Ratio: 3.9 ratio (ref 0.0–4.4)
Cholesterol, Total: 229 mg/dL — ABNORMAL HIGH (ref 100–199)
HDL: 58 mg/dL (ref >39)
LDL Chol Calc (NIH): 134 mg/dL — ABNORMAL HIGH (ref 0–99)
Triglycerides: 210 mg/dL — ABNORMAL HIGH (ref 0–149)
VLDL Cholesterol Cal: 37 mg/dL (ref 5–40)

## 2023-10-12 LAB — CBC WITH DIFFERENTIAL/PLATELET
Basophils Absolute: 0.1 10*3/uL (ref 0.0–0.2)
Basos: 1 %
EOS (ABSOLUTE): 0.3 10*3/uL (ref 0.0–0.4)
Eos: 4 %
Hematocrit: 39.7 % (ref 34.0–46.6)
Hemoglobin: 12.8 g/dL (ref 11.1–15.9)
Immature Grans (Abs): 0 10*3/uL (ref 0.0–0.1)
Immature Granulocytes: 0 %
Lymphocytes Absolute: 2.8 10*3/uL (ref 0.7–3.1)
Lymphs: 40 %
MCH: 29.9 pg (ref 26.6–33.0)
MCHC: 32.2 g/dL (ref 31.5–35.7)
MCV: 93 fL (ref 79–97)
Monocytes Absolute: 0.7 10*3/uL (ref 0.1–0.9)
Monocytes: 10 %
Neutrophils Absolute: 3.1 10*3/uL (ref 1.4–7.0)
Neutrophils: 45 %
Platelets: 388 10*3/uL (ref 150–450)
RBC: 4.28 x10E6/uL (ref 3.77–5.28)
RDW: 13.7 % (ref 11.7–15.4)
WBC: 6.9 10*3/uL (ref 3.4–10.8)

## 2023-10-12 LAB — COMPREHENSIVE METABOLIC PANEL
ALT: 21 IU/L (ref 0–32)
AST: 27 IU/L (ref 0–40)
Albumin: 4.4 g/dL (ref 3.7–4.7)
Alkaline Phosphatase: 55 IU/L (ref 44–121)
BUN/Creatinine Ratio: 17 (ref 12–28)
BUN: 14 mg/dL (ref 8–27)
Bilirubin Total: 0.9 mg/dL (ref 0.0–1.2)
CO2: 24 mmol/L (ref 20–29)
Calcium: 10 mg/dL (ref 8.7–10.3)
Chloride: 102 mmol/L (ref 96–106)
Creatinine, Ser: 0.82 mg/dL (ref 0.57–1.00)
Globulin, Total: 2.7 g/dL (ref 1.5–4.5)
Glucose: 96 mg/dL (ref 70–99)
Potassium: 4.5 mmol/L (ref 3.5–5.2)
Sodium: 140 mmol/L (ref 134–144)
Total Protein: 7.1 g/dL (ref 6.0–8.5)
eGFR: 71 mL/min/{1.73_m2} (ref >59)

## 2023-10-20 ENCOUNTER — Ambulatory Visit
Admission: RE | Admit: 2023-10-20 | Discharge: 2023-10-20 | Disposition: A | Discharge status: Home / Self Care | Payer: Medicare Other | Source: Ambulatory Visit | Attending: Internal Medicine | Admitting: Internal Medicine

## 2023-10-20 DIAGNOSIS — Z1231 Encounter for screening mammogram for malignant neoplasm of breast: Secondary | ICD-10-CM | POA: Insufficient documentation

## 2023-11-09 ENCOUNTER — Telehealth: Payer: Self-pay | Admitting: *Deleted

## 2023-11-09 MED ORDER — LETROZOLE 2.5 MG PO TABS: 2.5000 mg | ORAL_TABLET | Freq: Every day | ORAL | 3 refills | Status: AC

## 2023-11-09 NOTE — Telephone Encounter (Signed)
 Refill letrozole  and she changed her pharmacy so I cancelled the 1st one and made it to the pharmacy she now wants. It is [facility] [place]

## 2023-11-09 NOTE — Telephone Encounter (Signed)
 Pt needs refill letrozole 

## 2023-11-24 DIAGNOSIS — J449 Chronic obstructive pulmonary disease, unspecified: Secondary | ICD-10-CM | POA: Diagnosis not present

## 2023-11-24 DIAGNOSIS — Z823 Family history of stroke: Secondary | ICD-10-CM | POA: Diagnosis not present

## 2023-11-24 DIAGNOSIS — H9193 Unspecified hearing loss, bilateral: Secondary | ICD-10-CM | POA: Diagnosis not present

## 2023-11-24 DIAGNOSIS — Z888 Allergy status to other drugs, medicaments and biological substances status: Secondary | ICD-10-CM | POA: Diagnosis not present

## 2023-11-24 DIAGNOSIS — Z8249 Family history of ischemic heart disease and other diseases of the circulatory system: Secondary | ICD-10-CM | POA: Diagnosis not present

## 2023-11-24 DIAGNOSIS — E785 Hyperlipidemia, unspecified: Secondary | ICD-10-CM | POA: Diagnosis not present

## 2023-11-24 DIAGNOSIS — I10 Essential (primary) hypertension: Secondary | ICD-10-CM | POA: Diagnosis not present

## 2023-11-24 DIAGNOSIS — Z833 Family history of diabetes mellitus: Secondary | ICD-10-CM | POA: Diagnosis not present

## 2023-11-24 DIAGNOSIS — Z87891 Personal history of nicotine dependence: Secondary | ICD-10-CM | POA: Diagnosis not present

## 2023-11-24 DIAGNOSIS — Z008 Encounter for other general examination: Secondary | ICD-10-CM | POA: Diagnosis not present

## 2023-11-24 DIAGNOSIS — M810 Age-related osteoporosis without current pathological fracture: Secondary | ICD-10-CM | POA: Diagnosis not present

## 2023-11-24 DIAGNOSIS — I2510 Atherosclerotic heart disease of native coronary artery without angina pectoris: Secondary | ICD-10-CM | POA: Diagnosis not present

## 2023-11-24 DIAGNOSIS — Z818 Family history of other mental and behavioral disorders: Secondary | ICD-10-CM | POA: Diagnosis not present

## 2023-12-07 DIAGNOSIS — Z0100 Encounter for examination of eyes and vision without abnormal findings: Secondary | ICD-10-CM | POA: Diagnosis not present

## 2023-12-07 DIAGNOSIS — H2513 Age-related nuclear cataract, bilateral: Secondary | ICD-10-CM | POA: Diagnosis not present

## 2023-12-07 DIAGNOSIS — H401431 Capsular glaucoma with pseudoexfoliation of lens, bilateral, mild stage: Secondary | ICD-10-CM | POA: Diagnosis not present

## 2024-01-12 ENCOUNTER — Ambulatory Visit
Admission: RE | Admit: 2024-01-12 | Discharge: 2024-01-12 | Disposition: A | Discharge status: Home / Self Care | Payer: Medicare Other | Source: Ambulatory Visit | Attending: Radiation Oncology | Admitting: Radiation Oncology

## 2024-01-12 ENCOUNTER — Ambulatory Visit

## 2024-01-12 VITALS — BP 151/98 | HR 81 | Temp 97.0°F | Resp 14 | Ht 65.0 in | Wt 173.5 lb

## 2024-01-12 VITALS — Ht 64.5 in | Wt 173.0 lb

## 2024-01-12 DIAGNOSIS — Z923 Personal history of irradiation: Secondary | ICD-10-CM | POA: Insufficient documentation

## 2024-01-12 DIAGNOSIS — C50212 Malignant neoplasm of upper-inner quadrant of left female breast: Secondary | ICD-10-CM | POA: Insufficient documentation

## 2024-01-12 DIAGNOSIS — Z0000 Encounter for general adult medical examination without abnormal findings: Secondary | ICD-10-CM

## 2024-01-12 DIAGNOSIS — Z79811 Long term (current) use of aromatase inhibitors: Secondary | ICD-10-CM | POA: Insufficient documentation

## 2024-01-12 DIAGNOSIS — Z170 Estrogen receptor positive status [ER+]: Secondary | ICD-10-CM | POA: Insufficient documentation

## 2024-01-12 NOTE — Progress Notes (Signed)
 Subjective:   Monique Hall is a 83 y.o. who presents for a Medicare Wellness preventive visit.  As a reminder, Annual Wellness Visits don't include a physical exam, and some assessments may be limited, especially if this visit is performed virtually. We may recommend an in-person follow-up visit with your provider if needed.  Visit Complete: Virtual I connected with  Monique Hall on 01/12/24 by a audio enabled telemedicine application and verified that I am speaking with the correct person using two identifiers.  Patient Location: Home  Provider Location: Home Office  I discussed the limitations of evaluation and management by telemedicine. The patient expressed understanding and agreed to proceed.  Vital Signs: Because this visit was a virtual/telehealth visit, some criteria may be missing or patient reported. Any vitals not documented were not able to be obtained and vitals that have been documented are patient reported.  VideoDeclined- This patient declined Librarian, academic. Therefore the visit was completed with audio only.  Persons Participating in Visit: Patient.  AWV Questionnaire: Yes: Patient Medicare AWV questionnaire was completed by the patient on 01/09/24; I have confirmed that all information answered by patient is correct and no changes since this date.  Cardiac Risk Factors include: advanced age (>56men, >32 women);dyslipidemia;hypertension     Objective:    Today's Vitals   01/12/24 1429  Weight: 173 lb (78.5 kg)  Height: 5' 4.5 (1.638 m)  PainSc: 5    Body mass index is 29.24 kg/m.     01/12/2024    2:38 PM 01/12/2024   10:14 AM 01/13/2023    9:44 AM 10/07/2022    8:47 AM 10/15/2021   11:54 AM 09/28/2021    3:31 PM 07/02/2021    2:17 PM  Advanced Directives  Does Patient Have a Medical Advance Directive? No Yes No No No No No  Does patient want to make changes to medical advance directive? No - Patient declined No - Patient  declined       Would patient like information on creating a medical advance directive?   No - Patient declined No - Patient declined No - Patient declined No - Patient declined No - Patient declined    Current Medications (verified) Outpatient Encounter Medications as of 01/12/2024  Medication Sig   acetaminophen  (TYLENOL ) 500 MG tablet Take 1 tablet (500 mg total) by mouth every 6 (six) hours as needed for mild pain or moderate pain. For shoulder pain   alendronate  (FOSAMAX ) 70 MG tablet Take 1 tablet (70 mg total) by mouth once a week. Take with a full glass of water on an empty stomach.   aspirin EC 81 MG tablet Take 81 mg by mouth daily.   Black Elderberry (SAMBUCUS ELDERBERRY PO) Take 1 each by mouth daily. gummy   calcium-vitamin D (OSCAL WITH D) 500-200 MG-UNIT tablet Take 1 tablet by mouth daily.   ezetimibe  (ZETIA ) 10 MG tablet Take 1 tablet (10 mg total) by mouth daily.   letrozole  (FEMARA ) 2.5 MG tablet Take 1 tablet (2.5 mg total) by mouth daily.   loratadine (CLARITIN) 10 MG tablet Take 10 mg by mouth daily.   losartan  (COZAAR ) 100 MG tablet Take 1 tablet (100 mg total) by mouth daily.   Multiple Vitamin (MULTIVITAMIN) tablet Take 1 tablet by mouth daily.   triamcinolone  (NASACORT ) 55 MCG/ACT AERO nasal inhaler Place into the nose. (Patient taking differently: Place into the nose. prn)   No facility-administered encounter medications on file as of 01/12/2024.  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   Esophagitis    Gastritis    Gastroesophageal reflux disease without esophagitis    History of kidney stones    Hypercholesteremia    Hypertension    Lipoma of neck 04/27/2018   Personal history of radiation therapy    Sciatica    Tendonitis, Achilles 2016   both    Thyroid  nodule 2019    Past Surgical History:  Procedure Laterality Date   ABDOMINAL HYSTERECTOMY  1985   total for bleeding   APPENDECTOMY     BIOPSY THYROID   2019   BLADDER SUSPENSION  2004   BREAST BIOPSY Left 10/09/2020   US  Bx, Q-clip, [facility]   BREAST LUMPECTOMY Left 11/06/2020   BREAST LUMPECTOMY,RADIO FREQ LOCALIZER,AXILLARY SENTINEL LYMPH NODE BIOPSY Left 11/06/2020   INVASIVE MAMMARY CARCINOMA, NO SPECIAL TYPE BREAST LUMPECTOMY,RADIO FREQ LOCALIZER,AXILLARY SENTINEL LYMPH NODE BIOPSY;  Surgeon: Tye Millet, DO;  Location: [facility] ORS;  Service: General;  Laterality: Left;   lipoma excision  03/2018   posterior neck   Family History  Problem Relation Age of Onset   CAD Mother    Diabetes Mother    Arthritis Mother    Hypertension Mother    Parkinson's disease Father    COPD Father    Hearing loss Father    Breast cancer Neg Hx    Social History   Socioeconomic History   Marital status: Widowed    Spouse name: Not on file   Number of children: 3   Years of education: Not on file   Highest education level: 12th grade  Occupational History   Occupation: Retired  Tobacco Use   Smoking status: Former    Current packs/day: 0.00    Average packs/day: 1 pack/day for 35.0 years (35.0 ttl pk-yrs)    Types: Cigarettes    Start date: 09/02/1967    Quit date: 09/01/2002    Years since quitting: 21.3   Smokeless tobacco: Never   Tobacco comments:    smoking cessation materials not required  Vaping Use   Vaping status: Never Used  Substance and Sexual Activity   Alcohol use: Not Currently    Comment: rare, last use 2020   Drug use: Never   Sexual activity: Not Currently    Birth control/protection: Surgical    Comment: Hysterectomy  Other Topics Concern   Not on file  Social History Narrative   Pt's daughter lives with her   Social Drivers of Health   Financial Resource Strain: Low Risk  (01/12/2024)   Overall Financial Resource Strain (CARDIA)    Difficulty of Paying Living Expenses:  Not hard at all  Food Insecurity: No Food Insecurity (01/12/2024)   Hunger Vital Sign    Worried About Running Out of Food in the Last Year: Never true    Ran Out of Food in the Last Year: Never true  Transportation Needs: No Transportation Needs (01/12/2024)   PRAPARE - Administrator, Civil Service (Medical): No    Lack of Transportation (Non-Medical): No  Physical Activity: Inactive (01/12/2024)   Exercise Vital Sign    Days of Exercise per Week: 0 days    Minutes of Exercise per Session: 0 min  Stress: No Stress Concern Present (01/12/2024)   [employer] of Occupational Health - Occupational Stress Questionnaire  Feeling of Stress: Not at all  Social Connections: Moderately Isolated (01/12/2024)   Social Connection and Isolation Panel    Frequency of Communication with Friends and Family: More than three times a week    Frequency of Social Gatherings with Friends and Family: More than three times a week    Attends Religious Services: More than 4 times per year    Active Member of [employer] or Organizations: No    Attends Banker Meetings: Never    Marital Status: Widowed    Tobacco Counseling Counseling given: Not Answered Tobacco comments: smoking cessation materials not required    Clinical Intake:  Pre-visit preparation completed: Yes  Pain : 0-10 Pain Score: 5  Pain Type: Chronic pain Pain Location: Back Pain Descriptors / Indicators: Aching     BMI - recorded: 29.24 Nutritional Status: BMI 25 -29 Overweight Nutritional Risks: None Diabetes: No  Lab Results  Component Value Date   HGBA1C 5.8 (H) 10/11/2023   HGBA1C 5.8 (H) 10/07/2022     How often do you need to have someone help you when you read instructions, pamphlets, or other written materials from your doctor or pharmacy?: 1 - Never  Interpreter Needed?: No  Information entered by :: Vina Ned, CMA   Activities of Daily Living     01/12/2024    2:31 PM 01/09/2024    10:53 AM  In your present state of health, do you have any difficulty performing the following activities:  Hearing? 1 0  Comment wears hearing aids   Vision? 0 0  Difficulty concentrating or making decisions? 0 0  Walking or climbing stairs? 1 1  Comment due to back pain   Dressing or bathing? 0 0  Doing errands, shopping? 0 0  Preparing Food and eating ? N N  Using the Toilet? N N  In the past six months, have you accidently leaked urine? N N  Do you have problems with loss of bowel control? N N  Managing your Medications? N N  Managing your Finances? N N  Housekeeping or managing your Housekeeping? Y Y  Comment due to back pain     Patient Care Team: Justus Leita DEL, MD as PCP - General (Internal Medicine) Avanell Katz, MD as Referring Physician (Physical Medicine and Rehabilitation) Cindie Jesusa HERO, [name] Jacobo, Evalene PARAS, MD as Consulting Physician (Oncology) Lenn Aran, MD as Consulting Physician (Radiation Oncology) Mevelyn JONETTA Bathe, OD (Optometry) Hilarie Rocher, MD as Consulting Physician (Cardiology)  I have updated your Care Teams any recent Medical Services you may have received from other providers in the past year.     Assessment:   This is a routine wellness examination for [facility]  Hearing/Vision screen Hearing Screening - Comments:: Wears hearing aids Vision Screening - Comments:: Gets routine eye exams, Dr. Bathe Mevelyn, [place]   Goals Addressed             This Visit's Progress    Patient Stated       Get better control of back pain       Depression Screen     01/12/2024    2:36 PM 01/12/2024   10:17 AM 10/11/2023    8:19 AM 04/12/2023   10:39 AM 10/07/2022    8:46 AM 04/02/2022    9:41 AM 10/01/2021    8:56 AM  PHQ 2/9 Scores  PHQ - 2 Score 0 0 0 0 0 0 0  PHQ- 9 Score 1  0 1 0 1 0    Fall Risk     01/12/2024    2:40 PM 01/09/2024   10:53 AM 10/11/2023    8:19 AM 04/12/2023   10:39 AM  10/07/2022    8:48 AM  Fall Risk   Falls in the past year? 0 0 0 0 0  Number falls in past yr: 0  0 0 0  Injury with Fall? 0  0 0 0  Risk for fall due to : No Fall Risks  No Fall Risks No Fall Risks No Fall Risks  Follow up Falls evaluation completed  Falls evaluation completed Falls evaluation completed Falls prevention discussed;Falls evaluation completed    MEDICARE RISK AT HOME:  Medicare Risk at Home Any stairs in or around the home?: Yes If so, are there any without handrails?: No Home free of loose throw rugs in walkways, pet beds, electrical cords, etc?: Yes Adequate lighting in your home to reduce risk of falls?: Yes Life alert?: No Use of a cane, walker or w/c?: No Grab bars in the bathroom?: Yes Shower chair or bench in shower?: Yes Elevated toilet seat or a handicapped toilet?: Yes  TIMED UP AND GO:  Was the test performed?  No  Cognitive Function: 6CIT completed        01/12/2024    2:40 PM 10/07/2022    8:52 AM 09/20/2019    9:00 AM 09/17/2019    3:37 PM 09/11/2018    2:56 PM  6CIT Screen  What Year? 0 points 0 points 0 points 0 points 0 points  What month? 0 points 0 points 0 points 0 points 0 points  What time? 0 points 0 points 0 points 0 points 0 points  Count back from 20 0 points 0 points 0 points 0 points 0 points  Months in reverse 0 points 0 points 0 points 0 points 0 points  Repeat phrase 0 points 0 points 2 points 0 points 0 points  Total Score 0 points 0 points 2 points 0 points 0 points    Immunizations Immunization History  Administered Date(s) Administered   Fluad Quad(high Dose 65+) 03/27/2020, 04/21/2021, 04/02/2022   Fluad Trivalent(High Dose 65+) 04/12/2023   Influenza-Unspecified 04/28/2017, 05/11/2018, 05/11/2019   PFIZER(Purple Top)SARS-COV-2 Vaccination 04/10/2020, 05/01/2020   Pneumococcal Conjugate-13 06/24/2014   Pneumococcal Polysaccharide-23 07/21/2011   Tdap 07/20/2005, 08/29/2015   Zoster, Live 07/20/2000    Screening  Tests Health Maintenance  Topic Date Due   Zoster Vaccines- Shingrix (1 of 2) 08/19/1960   COVID-19 Vaccine (3 - Pfizer risk series) 05/29/2020   INFLUENZA VACCINE  02/17/2024   MAMMOGRAM  10/19/2024   Medicare Annual Wellness (AWV)  01/11/2025   DEXA SCAN  01/16/2025   DTaP/Tdap/Td (3 - Td or Tdap) 08/28/2025   Pneumococcal Vaccine: 50+ Years  Completed   Hepatitis B Vaccines  Aged Out   HPV VACCINES  Aged Out   Meningococcal B Vaccine  Aged Out   Hepatitis C Screening  Discontinued    Health Maintenance  Health Maintenance Due  Topic Date Due   Zoster Vaccines- Shingrix (1 of 2) 08/19/1960   COVID-19 Vaccine (3 - Pfizer risk series) 05/29/2020   Health Maintenance Items Addressed: See Nurse Notes at the end of this note  Additional Screening:  Vision Screening: Recommended annual ophthalmology exams for early detection of glaucoma and other disorders of the eye. Would you like a referral to an eye doctor? No    Dental Screening: Recommended annual dental  exams for proper oral hygiene  Community Resource Referral / Chronic Care Management: CRR required this visit?  No   CCM required this visit?  No   Plan:    I have personally reviewed and noted the following in the patient's chart:   Medical and social history Use of alcohol, tobacco or illicit drugs  Current medications and supplements including opioid prescriptions. Patient is not currently taking opioid prescriptions. Functional ability and status Nutritional status Physical activity Advanced directives List of other physicians Hospitalizations, surgeries, and ER visits in previous 12 months Vitals Screenings to include cognitive, depression, and falls Referrals and appointments  In addition, I have reviewed and discussed with patient certain preventive protocols, quality metrics, and best practice recommendations. A written personalized care plan for preventive services as well as general preventive  health recommendations were provided to patient.   Vina Ned, CMA   01/12/2024   After Visit Summary: (MyChart) Due to this being a telephonic visit, the after visit summary with patients personalized plan was offered to patient via MyChart   Notes:  Declined shingles and covid vaccines Screening colonoscopy no longer recommended due to age

## 2024-01-12 NOTE — Patient Instructions (Signed)
 Monique Hall , Thank you for taking time out of your busy schedule to complete your Annual Wellness Visit with me. I enjoyed our conversation and look forward to speaking with you again next year. I, as well as your care team,  appreciate your ongoing commitment to your health goals. Please review the following plan we discussed and let me know if I can assist you in the future. Your Game plan/ To Do List    Referrals: None   Follow up Visits: Next Medicare AWV with our clinical staff: 01/24/25 @ 1:50pm (PHONE VISIT)   Have you seen your provider in the last 6 months (3 months if uncontrolled diabetes)? Yes Next Office Visit with your provider: 04/12/24 @ 9:20am with Dr. Justus  Clinician Recommendations:  Aim for 30 minutes of exercise or brisk walking, 6-8 glasses of water, and 5 servings of fruits and vegetables each day.       This is a list of the screening recommended for you and due dates:  Health Maintenance  Topic Date Due   Zoster (Shingles) Vaccine (1 of 2) 08/19/1960   COVID-19 Vaccine (3 - Pfizer risk series) 05/29/2020   Flu Shot  02/17/2024   Mammogram  10/19/2024   Medicare Annual Wellness Visit  01/11/2025   DEXA scan (bone density measurement)  01/16/2025   DTaP/Tdap/Td vaccine (3 - Td or Tdap) 08/28/2025   Pneumococcal Vaccine for age over 58  Completed   Hepatitis B Vaccine  Aged Out   HPV Vaccine  Aged Out   Meningitis B Vaccine  Aged Out   Hepatitis C Screening  Discontinued    Advanced directives: (Declined) Advance directive discussed with you today. Even though you declined this today, please call our office should you change your mind, and we can give you the proper paperwork for you to fill out. Advance Care Planning is important because it:  [x]  Makes sure you receive the medical care that is consistent with your values, goals, and preferences  [x]  It provides guidance to your family and loved ones and reduces their decisional burden about whether or not  they are making the right decisions based on your wishes.  Follow the link provided in your after visit summary or read over the paperwork we have mailed to you to help you started getting your Advance Directives in place. If you need assistance in completing these, please reach out to us  so that we can help you!  See attachments for Preventive Care and Fall Prevention Tips.   Fall Prevention in the Home, Adult Falls can cause injuries and affect people of all ages. There are many simple things that you can do to make your home safe and to help prevent falls. If you need it, ask for help making these changes. What actions can I take to prevent falls? General information Use good lighting in all rooms. Make sure to: Replace any light bulbs that burn out. Turn on lights if it is dark and use night-lights. Keep items that you use often in easy-to-reach places. Lower the shelves around your home if needed. Move furniture so that there are clear paths around it. Do not keep throw rugs or other things on the floor that can make you trip. If any of your floors are uneven, fix them. Add color or contrast paint or tape to clearly mark and help you see: Grab bars or handrails. First and last steps of staircases. Where the edge of each step is. If you use  a ladder or stepladder: Make sure that it is fully opened. Do not climb a closed ladder. Make sure the sides of the ladder are locked in place. Have someone hold the ladder while you use it. Know where your pets are as you move through your home. What can I do in the bathroom?     Keep the floor dry. Clean up any water that is on the floor right away. Remove soap buildup in the bathtub or shower. Buildup makes bathtubs and showers slippery. Use non-skid mats or decals on the floor of the bathtub or shower. Attach bath mats securely with double-sided, non-slip rug tape. If you need to sit down while you are in the shower, use a non-slip  stool. Install grab bars by the toilet and in the bathtub and shower. Do not use towel bars as grab bars. What can I do in the bedroom? Make sure that you have a light by your bed that is easy to reach. Do not use any sheets or blankets on your bed that hang to the floor. Have a firm bench or chair with side arms that you can use for support when you get dressed. What can I do in the kitchen? Clean up any spills right away. If you need to reach something above you, use a sturdy step stool that has a grab bar. Keep electrical cables out of the way. Do not use floor polish or wax that makes floors slippery. What can I do with my stairs? Do not leave anything on the stairs. Make sure that you have a light switch at the top and the bottom of the stairs. Have them installed if you do not have them. Make sure that there are handrails on both sides of the stairs. Fix handrails that are broken or loose. Make sure that handrails are as long as the staircases. Install non-slip stair treads on all stairs in your home if they do not have carpet. Avoid having throw rugs at the top or bottom of stairs, or secure the rugs with carpet tape to prevent them from moving. Choose a carpet design that does not hide the edge of steps on the stairs. Make sure that carpet is firmly attached to the stairs. Fix any carpet that is loose or worn. What can I do on the outside of my home? Use bright outdoor lighting. Repair the edges of walkways and driveways and fix any cracks. Clear paths of anything that can make you trip, such as tools or rocks. Add color or contrast paint or tape to clearly mark and help you see high doorway thresholds. Trim any bushes or trees on the main path into your home. Check that handrails are securely fastened and in good repair. Both sides of all steps should have handrails. Install guardrails along the edges of any raised decks or porches. Have leaves, snow, and ice cleared regularly. Use  sand, salt, or ice melt on walkways during winter months if you live where there is ice and snow. In the garage, clean up any spills right away, including grease or oil spills. What other actions can I take? Review your medicines with your health care provider. Some medicines can make you confused or feel dizzy. This can increase your chance of falling. Wear closed-toe shoes that fit well and support your feet. Wear shoes that have rubber soles and low heels. Use a cane, walker, scooter, or crutches that help you move around if needed. Talk with your provider about  other ways that you can decrease your risk of falls. This may include seeing a physical therapist to learn to do exercises to improve movement and strength. Where to find more information Centers for Disease Control and Prevention, STEADI: TonerPromos.no [employer] on Aging: BaseRingTones.pl National Institute on Aging: BaseRingTones.pl Contact a health care provider if: You are afraid of falling at home. You feel weak, drowsy, or dizzy at home. You fall at home. Get help right away if you: Lose consciousness or have trouble moving after a fall. Have a fall that causes a head injury. These symptoms may be an emergency. Get help right away. Call 911. Do not wait to see if the symptoms will go away. Do not drive yourself to the hospital. This information is not intended to replace advice given to you by your health care provider. Make sure you discuss any questions you have with your health care provider. Document Revised: 03/08/2022 Document Reviewed: 03/08/2022 Elsevier Patient Education  2024 [employer].

## 2024-01-12 NOTE — Progress Notes (Signed)
 Radiation Oncology Follow up Note  Name: Monique Hall   Date:   01/12/2024 MRN:  [id number] DOB: 1941/02/12    This 83 y.o. female presents to the clinic today for 3-year follow-up status post whole breast radiation to her left breast for stage Ia ER/PR positive invasive mammary carcinoma.  REFERRING PROVIDER: Justus Leita DEL, MD  HPI: Patient is a 83 year old female now out over 3 years having completed whole breast radiation to her left breast for stage Ia ER/PR positive invasive mammary carcinoma.  Seen today in routine follow-up she is doing well specifically denies breast tenderness cough or bone pain..  She had a screening mammogram back in April which I have reviewed BI-RADS Category 1 negative.  She is currently on letrozole  tolerating that well without side effect.  COMPLICATIONS OF TREATMENT: none  FOLLOW UP COMPLIANCE: keeps appointments   PHYSICAL EXAM:  BP (!) 151/98   Pulse 81   Temp (!) 97 F (36.1 C) (Tympanic)   Resp 14   Ht 5' 5 (1.651 m)   Wt 173 lb 8 oz (78.7 kg)   BMI 28.87 kg/m  Lungs are clear to A&P cardiac examination essentially unremarkable with regular rate and rhythm. No dominant mass or nodularity is noted in either breast in 2 positions examined. Incision is well-healed. No axillary or supraclavicular adenopathy is appreciated. Cosmetic result is excellent.  Well-developed well-nourished patient in NAD. HEENT reveals PERLA, EOMI, discs not visualized.  Oral cavity is clear. No oral mucosal lesions are identified. Neck is clear without evidence of cervical or supraclavicular adenopathy. Lungs are clear to A&P. Cardiac examination is essentially unremarkable with regular rate and rhythm without murmur rub or thrill. Abdomen is benign with no organomegaly or masses noted. Motor sensory and DTR levels are equal and symmetric in the upper and lower extremities. Cranial nerves II through XII are grossly intact. Proprioception is intact. No peripheral adenopathy  or edema is identified. No motor or sensory levels are noted. Crude visual fields are within normal range.  RADIOLOGY RESULTS: Mammograms reviewed compatible with above-stated findings  PLAN: Present time patient is now out over 3 years with no evidence of disease.  I will turn follow-up care over to medical oncology and her GP.  Patient knows to call with any concerns at any time.  She continues on letrozole  without side effect.  I would like to take this opportunity to thank you for allowing me to participate in the care of your patient.SABRA Marcey Penton, MD

## 2024-02-06 ENCOUNTER — Telehealth: Payer: Self-pay | Admitting: *Deleted

## 2024-02-06 NOTE — Telephone Encounter (Signed)
 Patient called saying that she is going to have epidural on February 16, 2024 and she wants to know if that is going to mess up anything about alendronate  that she is on.  I called the patient and let her know that Dr. Jacobo is off this week but one of the nurse practitioners will be getting in touch with you

## 2024-02-16 DIAGNOSIS — M48062 Spinal stenosis, lumbar region with neurogenic claudication: Secondary | ICD-10-CM | POA: Diagnosis not present

## 2024-02-16 DIAGNOSIS — M5416 Radiculopathy, lumbar region: Secondary | ICD-10-CM | POA: Diagnosis not present

## 2024-03-12 ENCOUNTER — Other Ambulatory Visit: Payer: Self-pay | Admitting: Physical Medicine and Rehabilitation

## 2024-03-12 DIAGNOSIS — M5416 Radiculopathy, lumbar region: Secondary | ICD-10-CM

## 2024-03-12 DIAGNOSIS — M7541 Impingement syndrome of right shoulder: Secondary | ICD-10-CM | POA: Diagnosis not present

## 2024-03-12 DIAGNOSIS — M48062 Spinal stenosis, lumbar region with neurogenic claudication: Secondary | ICD-10-CM | POA: Diagnosis not present

## 2024-03-13 ENCOUNTER — Ambulatory Visit
Admission: RE | Admit: 2024-03-13 | Discharge: 2024-03-13 | Disposition: A | Discharge status: Home / Self Care | Source: Ambulatory Visit | Attending: Physical Medicine and Rehabilitation | Admitting: Physical Medicine and Rehabilitation

## 2024-03-13 DIAGNOSIS — M5416 Radiculopathy, lumbar region: Secondary | ICD-10-CM

## 2024-03-13 DIAGNOSIS — M5117 Intervertebral disc disorders with radiculopathy, lumbosacral region: Secondary | ICD-10-CM | POA: Diagnosis not present

## 2024-03-13 DIAGNOSIS — M48061 Spinal stenosis, lumbar region without neurogenic claudication: Secondary | ICD-10-CM | POA: Diagnosis not present

## 2024-03-13 DIAGNOSIS — M4726 Other spondylosis with radiculopathy, lumbar region: Secondary | ICD-10-CM | POA: Diagnosis not present

## 2024-03-22 DIAGNOSIS — M5416 Radiculopathy, lumbar region: Secondary | ICD-10-CM | POA: Diagnosis not present

## 2024-03-22 DIAGNOSIS — I10 Essential (primary) hypertension: Secondary | ICD-10-CM | POA: Diagnosis not present

## 2024-03-22 DIAGNOSIS — Z87891 Personal history of nicotine dependence: Secondary | ICD-10-CM | POA: Diagnosis not present

## 2024-03-22 DIAGNOSIS — Z789 Other specified health status: Secondary | ICD-10-CM | POA: Diagnosis not present

## 2024-03-22 DIAGNOSIS — M7541 Impingement syndrome of right shoulder: Secondary | ICD-10-CM | POA: Diagnosis not present

## 2024-03-22 DIAGNOSIS — M48062 Spinal stenosis, lumbar region with neurogenic claudication: Secondary | ICD-10-CM | POA: Diagnosis not present

## 2024-03-22 DIAGNOSIS — E785 Hyperlipidemia, unspecified: Secondary | ICD-10-CM | POA: Diagnosis not present

## 2024-03-22 DIAGNOSIS — I2510 Atherosclerotic heart disease of native coronary artery without angina pectoris: Secondary | ICD-10-CM | POA: Diagnosis not present

## 2024-03-22 DIAGNOSIS — I700 Atherosclerosis of aorta: Secondary | ICD-10-CM | POA: Diagnosis not present

## 2024-03-22 DIAGNOSIS — R9431 Abnormal electrocardiogram [ECG] [EKG]: Secondary | ICD-10-CM | POA: Diagnosis not present

## 2024-04-12 ENCOUNTER — Ambulatory Visit (INDEPENDENT_AMBULATORY_CARE_PROVIDER_SITE_OTHER): Admitting: Internal Medicine

## 2024-04-12 ENCOUNTER — Encounter: Payer: Self-pay | Admitting: Internal Medicine

## 2024-04-12 VITALS — BP 136/68 | HR 94 | Ht 64.5 in | Wt 171.0 lb

## 2024-04-12 DIAGNOSIS — M48061 Spinal stenosis, lumbar region without neurogenic claudication: Secondary | ICD-10-CM | POA: Diagnosis not present

## 2024-04-12 DIAGNOSIS — Z23 Encounter for immunization: Secondary | ICD-10-CM | POA: Diagnosis not present

## 2024-04-12 DIAGNOSIS — I10 Essential (primary) hypertension: Secondary | ICD-10-CM

## 2024-04-12 NOTE — Progress Notes (Signed)
 Date:  04/12/2024   Name:  Monique Hall   DOB:  January 05, 1941   MRN:  [id number]   Chief Complaint: Hypertension  Hypertension This is a chronic problem. The problem is controlled. Pertinent negatives include no chest pain, headaches, palpitations or shortness of breath. Past treatments include angiotensin blockers. The current treatment provides significant improvement. There is no history of kidney disease, CAD/MI or CVA.  Back Pain This is a recurrent problem. The problem occurs intermittently. The problem has been gradually worsening since onset. The pain is present in the lumbar spine. Pertinent negatives include no abdominal pain, chest pain, headaches or weakness.    Review of Systems  Constitutional:  Negative for fatigue and unexpected weight change.  HENT:  Negative for trouble swallowing.   Eyes:  Negative for visual disturbance.  Respiratory:  Negative for cough, chest tightness, shortness of breath and wheezing.   Cardiovascular:  Negative for chest pain, palpitations and leg swelling.  Gastrointestinal:  Negative for abdominal pain, constipation and diarrhea.  Musculoskeletal:  Positive for back pain. Negative for arthralgias and myalgias.  Neurological:  Negative for dizziness, weakness, light-headedness and headaches.     Lab Results  Component Value Date   NA 140 10/11/2023   K 4.5 10/11/2023   CO2 24 10/11/2023   GLUCOSE 96 10/11/2023   BUN 14 10/11/2023   CREATININE 0.82 10/11/2023   CALCIUM 10.0 10/11/2023   EGFR 71 10/11/2023   GFRNONAA 55 (L) 09/20/2019   Lab Results  Component Value Date   CHOL 229 (H) 10/11/2023   HDL 58 10/11/2023   LDLCALC 134 (H) 10/11/2023   TRIG 210 (H) 10/11/2023   CHOLHDL 3.9 10/11/2023   Lab Results  Component Value Date   TSH 2.200 10/11/2023   Lab Results  Component Value Date   HGBA1C 5.8 (H) 10/11/2023   Lab Results  Component Value Date   WBC 6.9 10/11/2023   HGB 12.8 10/11/2023   HCT 39.7 10/11/2023   MCV  93 10/11/2023   PLT 388 10/11/2023   Lab Results  Component Value Date   ALT 21 10/11/2023   AST 27 10/11/2023   ALKPHOS 55 10/11/2023   BILITOT 0.9 10/11/2023   No results found for: MARIEN BOLLS, VD25OH   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) 10/23/2020   Shoulder pain, bilateral 01/24/2020   Aortic atherosclerosis 09/15/2018   Centrilobular emphysema (HCC) 09/15/2018   Thyroid  nodule 10/06/2017   Gastroesophageal reflux disease without esophagitis 09/02/2016   Essential hypertension 07/29/2015   Mixed hyperlipidemia 07/29/2015   Menopausal symptoms 07/29/2015   Tobacco use disorder, moderate, in sustained remission 07/29/2015   Degeneration of intervertebral disc of lumbar region 06/17/2015   Neuritis 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 02/05/2015    Allergies  Allergen Reactions   Hydrochlorothiazide Other (See Comments)    hypercalcemia   Baclofen      Other reaction(s): Unknown   Statins Other (See Comments)    myalgia   Etodolac Nausea Only    Past Surgical History:  Procedure Laterality Date   ABDOMINAL HYSTERECTOMY  1985   total for bleeding   APPENDECTOMY     BIOPSY THYROID   2019   BLADDER SUSPENSION  2004   BREAST BIOPSY Left 10/09/2020   US  Bx, Q-clip, [facility]  BREAST LUMPECTOMY Left 11/06/2020   BREAST LUMPECTOMY,RADIO FREQ LOCALIZER,AXILLARY SENTINEL LYMPH NODE BIOPSY Left 11/06/2020   INVASIVE MAMMARY CARCINOMA, NO SPECIAL TYPE BREAST LUMPECTOMY,RADIO FREQ LOCALIZER,AXILLARY SENTINEL LYMPH NODE BIOPSY;  Surgeon: Tye Millet, DO;  Location: [facility] ORS;  Service: General;  Laterality: Left;   lipoma excision  03/2018   posterior neck    Social History   Tobacco Use   Smoking status: Former    Current packs/day:  0.00    Average packs/day: 1 pack/day for 35.0 years (35.0 ttl pk-yrs)    Types: Cigarettes    Start date: 09/02/1967    Quit date: 09/01/2002    Years since quitting: 21.6   Smokeless tobacco: Never   Tobacco comments:    smoking cessation materials not required  Vaping Use   Vaping status: Never Used  Substance Use Topics   Alcohol use: Not Currently    Comment: rare, last use 2020   Drug use: Never     Medication list has been reviewed and updated.  Current Meds  Medication Sig   acetaminophen  (TYLENOL ) 500 MG tablet Take 1 tablet (500 mg total) by mouth every 6 (six) hours as needed for mild pain or moderate pain. For shoulder pain   alendronate  (FOSAMAX ) 70 MG tablet Take 1 tablet (70 mg total) by mouth once a week. Take with a full glass of water on an empty stomach.   aspirin EC 81 MG tablet Take 81 mg by mouth daily.   Black Elderberry (SAMBUCUS ELDERBERRY PO) Take 1 each by mouth daily. gummy   calcium-vitamin D (OSCAL WITH D) 500-200 MG-UNIT tablet Take 1 tablet by mouth daily.   ezetimibe  (ZETIA ) 10 MG tablet Take 1 tablet (10 mg total) by mouth daily.   letrozole  (FEMARA ) 2.5 MG tablet Take 1 tablet (2.5 mg total) by mouth daily.   loratadine (CLARITIN) 10 MG tablet Take 10 mg by mouth daily.   losartan  (COZAAR ) 100 MG tablet Take 1 tablet (100 mg total) by mouth daily.   Multiple Vitamin (MULTIVITAMIN) tablet Take 1 tablet by mouth daily.   triamcinolone  (NASACORT ) 55 MCG/ACT AERO nasal inhaler Place into the nose. (Patient taking differently: Place into the nose. prn)       04/12/2024    9:18 AM 10/11/2023    8:19 AM 04/12/2023   10:39 AM 04/02/2022    9:41 AM  GAD 7 : Generalized Anxiety Score  Nervous, Anxious, on Edge 0 0 0 0  Control/stop worrying 0 0 0 0  Worry too much - different things 0 0 0 0  Trouble relaxing 0 0 0 0  Restless 0 0 0 0  Easily annoyed or irritable 0 0 0 0  Afraid - awful might happen 0 0 0 0  Total GAD 7 Score 0 0 0 0  Anxiety  Difficulty Not difficult at all Not difficult at all Not difficult at all Not difficult at all       04/12/2024    9:18 AM 01/12/2024    2:36 PM 01/12/2024   10:17 AM  Depression screen PHQ 2/9  Decreased Interest 0 0 0  Down, Depressed, Hopeless 0 0 0  PHQ - 2 Score 0 0 0  Altered sleeping 0 1   Tired, decreased energy 0 0   Change in appetite 0 0   Feeling bad or failure about yourself  0 0   Trouble concentrating 0 0   Moving slowly or fidgety/restless 0 0   Suicidal thoughts 0 0  PHQ-9 Score 0 1   Difficult doing work/chores Not difficult at all Not difficult at all     BP Readings from Last 3 Encounters:  04/12/24 136/68  01/12/24 (!) 151/98  10/11/23 124/68    Physical Exam Vitals and nursing note reviewed.  Constitutional:      General: She is not in acute distress.    Appearance: She is well-developed.  HENT:     Head: Normocephalic and atraumatic.  Cardiovascular:     Rate and Rhythm: Normal rate and regular rhythm.     Heart sounds: No murmur heard. Pulmonary:     Effort: Pulmonary effort is normal. No respiratory distress.     Breath sounds: No wheezing or rhonchi.  Musculoskeletal:     Cervical back: Normal range of motion.     Right lower leg: No edema.     Left lower leg: No edema.  Lymphadenopathy:     Cervical: No cervical adenopathy.  Skin:    General: Skin is warm and dry.     Findings: No rash.  Neurological:     Mental Status: She is alert and oriented to person, place, and time.     Sensory: Sensation is intact.     Motor: Motor function is intact.  Psychiatric:        Mood and Affect: Mood normal.        Behavior: Behavior normal.     Wt Readings from Last 3 Encounters:  04/12/24 171 lb (77.6 kg)  01/12/24 173 lb 8 oz (78.7 kg)  01/12/24 173 lb (78.5 kg)    BP 136/68   Pulse 94   Ht 5' 4.5 (1.638 m)   Wt 171 lb (77.6 kg)   SpO2 95%   BMI 28.90 kg/m   Assessment and Plan:  Problem List Items Addressed This Visit        Unprioritized   Essential hypertension - Primary (Chronic)   Blood pressure is well controlled on losartan . No medication side effects noted. Plan to continue current medications.       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   Currently getting ESI but most recently no benefit. She can get the next [facility] in December but feels that she is worsening Will go ahead and refer for Neurosurgical consultation.      Relevant Orders   Ambulatory referral to Neurosurgery   Other Visit Diagnoses       Encounter for immunization       Relevant Orders   Flu vaccine HIGH DOSE PF(Fluzone Trivalent) (Completed)       Return in about 6 months (around 10/10/2024) for [facility] CPX HTN, Dr. Lemon.    Monique HILARIO Adie, MD [facility] [place]

## 2024-04-12 NOTE — Assessment & Plan Note (Signed)
 Blood pressure is well controlled on losartan . No medication side effects noted. Plan to continue current medications.

## 2024-04-12 NOTE — Assessment & Plan Note (Signed)
 Currently getting ESI but most recently no benefit. She can get the next [facility] in December but feels that she is worsening Will go ahead and refer for Neurosurgical consultation.

## 2024-04-20 NOTE — Progress Notes (Unsigned)
 Referring Physician:  Justus Leita DEL, MD [address] [place],  [place] [address]  Primary Physician:  Justus Leita DEL, MD  History of Present Illness: 04/26/2024 Monique Hall is here today with a chief complaint of  worsening back pain and leg discomfort.  Chronic back pain has been present for many years, initially managed with injections, but her effectiveness has decreased. Current symptoms include back pain radiating down the back of the leg, primarily affecting the lower back and buttocks. Pain worsens with standing and walking, limiting her ability to walk for more than fifteen minutes without rest.  Osteoporosis is managed with alendronate , which she tolerates well. Back pain significantly impacts daily activities, such as cleaning and vacuuming, requiring frequent rest. Previously very active, she now uses a scooter for mobility during family trips.    Bowel/Bladder Dysfunction: none  Conservative measures:  Physical therapy: Has not participated in PT recently. Multimodal medical therapy including regular antiinflammatories: Tylenol , ibuprofen Injections: 02/16/2024: Bilateral L4-5 transforaminal ESI (50% relief, less effective, dexamethasone  12 mg)  12/02/2022: Bilateral L4-5 transforaminal ESI (80% relief, dexamethasone  12 mg) 11/22/2022: Right subacromial injection (good relief) 08/06/2021: Bilateral L4-5 transforaminal ESI (80% relief, dexamethasone  12 mg) 07/02/2021: Bilateral L4-5 transforaminal ESI (80% relief for 4 days then return of pain) 08/22/2020: Left L4-5 transforaminal ESI (80% relief) 07/30/2020: Left L4-5 transforaminal ESI (80% relief of leg pain, mild to moderate relief of low back and proximal buttock pain) 08/21/2019: Right L4-5 transforaminal ESI (moderate relief) 06/08/2019: Right L4-5 transforaminal ESI (good relief) 06/18/2015: Right L4-5 transforaminal ESI (moderate relief) 05/06/2015: Bilateral L4-5 transforaminal ESI (good  relief of left leg pain, persistent right leg pain) 04/01/2015: Bilateral L4-5 transforaminal ESI   Past Surgery: none  Monique Hall has no symptoms of cervical myelopathy.  The symptoms are causing a significant impact on the patient's life.   I have utilized the care everywhere function in epic to review the outside records available from external health systems.   Review of Systems:  A 10 point review of systems is negative, except for the pertinent positives and negatives detailed in the HPI.  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   Esophagitis    Gastritis    Gastroesophageal reflux disease without esophagitis    History of kidney stones    Hypercholesteremia    Hypertension    Lipoma of neck 04/27/2018   Personal history of radiation therapy    Sciatica    Tendonitis, Achilles 2016   both    Thyroid  nodule 2019    Past Surgical History: Past Surgical History:  Procedure Laterality Date   ABDOMINAL HYSTERECTOMY  1985   total for bleeding   APPENDECTOMY     BIOPSY THYROID   2019   BLADDER SUSPENSION  2004   BREAST BIOPSY Left 10/09/2020   US  Bx, Q-clip, [facility]   BREAST LUMPECTOMY Left 11/06/2020   BREAST LUMPECTOMY,RADIO FREQ LOCALIZER,AXILLARY SENTINEL LYMPH NODE BIOPSY Left 11/06/2020   INVASIVE MAMMARY CARCINOMA, NO SPECIAL TYPE BREAST LUMPECTOMY,RADIO FREQ LOCALIZER,AXILLARY SENTINEL LYMPH NODE BIOPSY;  Surgeon: Tye Millet, DO;  Location: [facility] ORS;  Service: General;  Laterality: Left;   lipoma excision  03/2018   posterior neck    Allergies: Allergies as of 04/26/2024 - Review Complete 04/26/2024  Allergen Reaction Noted   Hydrochlorothiazide Other (See Comments) 04/15/2023   Baclofen   07/29/2015  Statins Other (See Comments) 10/01/2021   Etodolac Nausea Only 12/23/2014    Medications:  Current Outpatient Medications:     acetaminophen  (TYLENOL ) 500 MG tablet, Take 1 tablet (500 mg total) by mouth every 6 (six) hours as needed for mild pain or moderate pain. For shoulder pain, Disp: , Rfl:    alendronate  (FOSAMAX ) 70 MG tablet, Take 1 tablet (70 mg total) by mouth once a week. Take with a full glass of water on an empty stomach., Disp: 12 tablet, Rfl: 3   aspirin EC 81 MG tablet, Take 81 mg by mouth daily., Disp: , Rfl:    Black Elderberry (SAMBUCUS ELDERBERRY PO), Take 1 each by mouth daily. gummy, Disp: , Rfl:    calcium-vitamin D (OSCAL WITH D) 500-200 MG-UNIT tablet, Take 1 tablet by mouth daily., Disp: , Rfl:    ezetimibe  (ZETIA ) 10 MG tablet, Take 1 tablet (10 mg total) by mouth daily., Disp: 90 tablet, Rfl: 3   letrozole  (FEMARA ) 2.5 MG tablet, Take 1 tablet (2.5 mg total) by mouth daily., Disp: 90 tablet, Rfl: 3   loratadine (CLARITIN) 10 MG tablet, Take 10 mg by mouth daily., Disp: , Rfl:    losartan  (COZAAR ) 100 MG tablet, Take 1 tablet (100 mg total) by mouth daily., Disp: 90 tablet, Rfl: 3   Multiple Vitamin (MULTIVITAMIN) tablet, Take 1 tablet by mouth daily., Disp: , Rfl:    triamcinolone  (NASACORT ) 55 MCG/ACT AERO nasal inhaler, Place into the nose. (Patient taking differently: Place into the nose. prn), Disp: , Rfl:    predniSONE (STERAPRED UNI-PAK 48 TAB) 5 MG (48) TBPK tablet, Take by mouth. (Patient not taking: Reported on 04/26/2024), Disp: , Rfl:   Social History: Social History   Tobacco Use   Smoking status: Former    Current packs/day: 0.00    Average packs/day: 1 pack/day for 35.0 years (35.0 ttl pk-yrs)    Types: Cigarettes    Start date: 09/02/1967    Quit date: 09/01/2002    Years since quitting: 21.6   Smokeless tobacco: Never   Tobacco comments:    smoking cessation materials not required  Vaping Use   Vaping status: Never Used  Substance Use Topics   Alcohol use: Not Currently    Comment: rare, last use 2020   Drug use: Never    Family Medical History: Family History   Problem Relation Age of Onset   CAD Mother    Diabetes Mother    Arthritis Mother    Hypertension Mother    Parkinson's disease Father    COPD Father    Hearing loss Father    Breast cancer Neg Hx     Physical Examination: Vitals:   04/26/24 1059  BP: (!) 140/82    General: Patient is in no apparent distress. Attention to examination is appropriate.  Neck:   Supple.  Full range of motion.  Respiratory: Patient is breathing without any difficulty.   NEUROLOGICAL:     Awake, alert, oriented to person, place, and time.  Speech is clear and fluent.   Cranial Nerves: Pupils equal round and reactive to light.  Facial tone is symmetric.  Facial sensation is symmetric. Shoulder shrug is symmetric. Tongue protrusion is midline.  There is no pronator drift.  Strength: Side Biceps Triceps Deltoid Interossei Grip Wrist Ext. Wrist Flex.  R 5 5 5 5 5 5 5   L 5 5 5 5 5 5 5    Side Iliopsoas Quads Hamstring PF DF EHL  R 5  5 5 5 5 5   L 5 5 5 5 5 5    Reflexes are 1+ and symmetric at the biceps, triceps, brachioradialis, patella and achilles.   Hoffman's is absent.   Bilateral upper and lower extremity sensation is intact to light touch.    No evidence of dysmetria noted.  Gait is normal.     Medical Decision Making  Imaging: MRI L spine 03/13/2024 Disc levels:   L1-2: Unremarkable   L2-3: Unremarkable   L3-4: Moderate to prominent central stenosis with borderline bilateral foraminal stenosis due to disc bulge and facet arthropathy. This is mildly worsened compared to the 02/11/2015 exam.   L4-5: Moderate central stenosis and borderline bilateral foraminal stenosis due to disc bulge and facet arthropathy. Right facet joint effusion, image 30 series 114. Mildly worsened compared to 02/11/2015.   L5-S1: Borderline left foraminal stenosis due to disc bulge and facet arthropathy along with left foraminal annular tear. Small bilateral facet joint effusions. Small synovial  cyst below the left L5-S1 facet joint.   IMPRESSION: 1. Lumbar spondylosis and degenerative disc disease, causing moderate to prominent impingement at L3-4 and moderate impingement at L4-5.     Electronically Signed   By: Ryan Salvage M.D.   On: 03/13/2024 18:55  I have personally reviewed the images and agree with the above interpretation.  Assessment and Plan: Ms. Walston is a pleasant 83 y.o. 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  She also has facet arthrosis at L4-5 and L5-S1.  This could cause her back pain with bilateral sciatica.  I would like to send her for physical therapy.  I will see her back in 8 weeks with x-rays with flexion-extension views.  If she does not improve with physical therapy, we will discuss the options.  She does have osteoporosis which complicates matters.  I spent a total of 30 minutes in this patient's care today. This time was spent reviewing pertinent records including imaging studies, obtaining and confirming history, performing a directed evaluation, formulating and discussing my recommendations, and documenting the visit within the medical record.        Thank you for involving me in the care of this patient.      Rodolphe Edmonston K. Clois MD, [facility] Neurosurgery

## 2024-04-26 ENCOUNTER — Ambulatory Visit: Admitting: Neurosurgery

## 2024-04-26 VITALS — BP 136/80 | Ht 64.5 in | Wt 174.6 lb

## 2024-04-26 DIAGNOSIS — M48062 Spinal stenosis, lumbar region with neurogenic claudication: Secondary | ICD-10-CM | POA: Diagnosis not present

## 2024-04-26 DIAGNOSIS — M5442 Lumbago with sciatica, left side: Secondary | ICD-10-CM | POA: Diagnosis not present

## 2024-04-26 DIAGNOSIS — G8929 Other chronic pain: Secondary | ICD-10-CM

## 2024-04-26 DIAGNOSIS — M5441 Lumbago with sciatica, right side: Secondary | ICD-10-CM | POA: Diagnosis not present

## 2024-05-14 NOTE — Progress Notes (Signed)
 Monique Hall                                          MRN: [id number]   05/14/2024   The [facility] Quality Team Specialist reviewed this patient medical record for the purposes of chart review for care gap closure. The following were reviewed: abstraction for care gap closure-controlling blood pressure.    [facility] Quality Team

## 2024-05-18 NOTE — Telephone Encounter (Signed)
 Encounter opened in error.

## 2024-06-21 ENCOUNTER — Ambulatory Visit: Admitting: Neurosurgery

## 2024-06-21 ENCOUNTER — Ambulatory Visit

## 2024-06-21 VITALS — BP 142/82

## 2024-06-21 DIAGNOSIS — M549 Dorsalgia, unspecified: Secondary | ICD-10-CM | POA: Diagnosis not present

## 2024-06-21 DIAGNOSIS — G8929 Other chronic pain: Secondary | ICD-10-CM

## 2024-06-21 DIAGNOSIS — M48062 Spinal stenosis, lumbar region with neurogenic claudication: Secondary | ICD-10-CM | POA: Diagnosis not present

## 2024-06-21 DIAGNOSIS — M5416 Radiculopathy, lumbar region: Secondary | ICD-10-CM | POA: Diagnosis not present

## 2024-06-21 DIAGNOSIS — M4726 Other spondylosis with radiculopathy, lumbar region: Secondary | ICD-10-CM | POA: Diagnosis not present

## 2024-06-21 NOTE — Progress Notes (Signed)
 Referring Physician:  Justus Leita DEL, MD [address] [place],  [place] [address]  Primary Physician:  Justus Leita DEL, MD  History of Present Illness: 06/21/2024 PT has helped some but only for a day or 2 after PT.  When she starts moving she gets lower back pain and buttock pain.  Most of her symptoms are in the lower buttocks and legs.    She has a shot scheduled for today.   04/26/2024 Ms. Shondrea Sabourin is here today with a chief complaint of  worsening back pain and leg discomfort.  Chronic back pain has been present for many years, initially managed with injections, but her effectiveness has decreased. Current symptoms include back pain radiating down the back of the leg, primarily affecting the lower back and buttocks. Pain worsens with standing and walking, limiting her ability to walk for more than fifteen minutes without rest.  Osteoporosis is managed with alendronate , which she tolerates well. Back pain significantly impacts daily activities, such as cleaning and vacuuming, requiring frequent rest. Previously very active, she now uses a scooter for mobility during family trips.    Bowel/Bladder Dysfunction: none  Conservative measures:  Physical therapy: Has not participated in PT recently. Multimodal medical therapy including regular antiinflammatories: Tylenol , ibuprofen Injections: 02/16/2024: Bilateral L4-5 transforaminal ESI (50% relief, less effective, dexamethasone  12 mg)  12/02/2022: Bilateral L4-5 transforaminal ESI (80% relief, dexamethasone  12 mg) 11/22/2022: Right subacromial injection (good relief) 08/06/2021: Bilateral L4-5 transforaminal ESI (80% relief, dexamethasone  12 mg) 07/02/2021: Bilateral L4-5 transforaminal ESI (80% relief for 4 days then return of pain) 08/22/2020: Left L4-5 transforaminal ESI (80% relief) 07/30/2020: Left L4-5 transforaminal ESI (80% relief of leg pain, mild to moderate relief of low back and proximal buttock  pain) 08/21/2019: Right L4-5 transforaminal ESI (moderate relief) 06/08/2019: Right L4-5 transforaminal ESI (good relief) 06/18/2015: Right L4-5 transforaminal ESI (moderate relief) 05/06/2015: Bilateral L4-5 transforaminal ESI (good relief of left leg pain, persistent right leg pain) 04/01/2015: Bilateral L4-5 transforaminal ESI   Past Surgery: none  [name] has no symptoms of cervical myelopathy.  The symptoms are causing a significant impact on the patient's life.   I have utilized the care everywhere function in epic to review the outside records available from external health systems.   Review of Systems:  A 10 point review of systems is negative, except for the pertinent positives and negatives detailed in the HPI.  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   Esophagitis    Gastritis    Gastroesophageal reflux disease without esophagitis    History of kidney stones    Hypercholesteremia    Hypertension    Lipoma of neck 04/27/2018   Personal history of radiation therapy    Sciatica    Tendonitis, Achilles 2016   both    Thyroid  nodule 2019    Past Surgical History: Past Surgical History:  Procedure Laterality Date   ABDOMINAL HYSTERECTOMY  1985   total for bleeding   APPENDECTOMY     BIOPSY THYROID   2019   BLADDER SUSPENSION  2004   BREAST BIOPSY Left 10/09/2020   US  Bx, Q-clip, [facility]   BREAST LUMPECTOMY Left 11/06/2020   BREAST LUMPECTOMY,RADIO FREQ LOCALIZER,AXILLARY SENTINEL LYMPH NODE BIOPSY Left 11/06/2020   INVASIVE MAMMARY CARCINOMA, NO SPECIAL TYPE BREAST LUMPECTOMY,RADIO FREQ LOCALIZER,AXILLARY SENTINEL LYMPH NODE BIOPSY;  Surgeon: Tye,  Isami, DO;  Location: [facility] ORS;  Service: General;  Laterality: Left;   lipoma excision  03/2018   posterior neck    Allergies: Allergies as of 06/21/2024 - Review Complete 06/21/2024   Allergen Reaction Noted   Hydrochlorothiazide Other (See Comments) 04/15/2023   Baclofen   07/29/2015   Statins Other (See Comments) 10/01/2021   Etodolac Nausea Only 12/23/2014    Medications:  Current Outpatient Medications:    acetaminophen  (TYLENOL ) 500 MG tablet, Take 1 tablet (500 mg total) by mouth every 6 (six) hours as needed for mild pain or moderate pain. For shoulder pain, Disp: , Rfl:    alendronate  (FOSAMAX ) 70 MG tablet, Take 1 tablet (70 mg total) by mouth once a week. Take with a full glass of water on an empty stomach., Disp: 12 tablet, Rfl: 3   aspirin EC 81 MG tablet, Take 81 mg by mouth daily., Disp: , Rfl:    Black Elderberry (SAMBUCUS ELDERBERRY PO), Take 1 each by mouth daily. gummy, Disp: , Rfl:    calcium-vitamin D (OSCAL WITH D) 500-200 MG-UNIT tablet, Take 1 tablet by mouth daily., Disp: , Rfl:    ezetimibe  (ZETIA ) 10 MG tablet, Take 1 tablet (10 mg total) by mouth daily., Disp: 90 tablet, Rfl: 3   letrozole  (FEMARA ) 2.5 MG tablet, Take 1 tablet (2.5 mg total) by mouth daily., Disp: 90 tablet, Rfl: 3   loratadine (CLARITIN) 10 MG tablet, Take 10 mg by mouth daily., Disp: , Rfl:    losartan  (COZAAR ) 100 MG tablet, Take 1 tablet (100 mg total) by mouth daily., Disp: 90 tablet, Rfl: 3   Multiple Vitamin (MULTIVITAMIN) tablet, Take 1 tablet by mouth daily., Disp: , Rfl:    predniSONE (STERAPRED UNI-PAK 48 TAB) 5 MG (48) TBPK tablet, Take by mouth., Disp: , Rfl:    triamcinolone  (NASACORT ) 55 MCG/ACT AERO nasal inhaler, Place into the nose. (Patient taking differently: Place into the nose. prn), Disp: , Rfl:   Social History: Social History   Tobacco Use   Smoking status: Former    Current packs/day: 0.00    Average packs/day: 1 pack/day for 35.0 years (35.0 ttl pk-yrs)    Types: Cigarettes    Start date: 09/02/1967    Quit date: 09/01/2002    Years since quitting: 21.8   Smokeless tobacco: Never   Tobacco comments:    smoking cessation materials not required   Vaping Use   Vaping status: Never Used  Substance Use Topics   Alcohol use: Not Currently    Comment: rare, last use 2020   Drug use: Never    Family Medical History: Family History  Problem Relation Age of Onset   CAD Mother    Diabetes Mother    Arthritis Mother    Hypertension Mother    Parkinson's disease Father    COPD Father    Hearing loss Father    Breast cancer Neg Hx     Physical Examination: Vitals:   06/21/24 0914  BP: (!) 142/82    General: Patient is in no apparent distress. Attention to examination is appropriate.  Neck:   Supple.  Full range of motion.  Respiratory: Patient is breathing without any difficulty.   NEUROLOGICAL:     Awake, alert, oriented to person, place, and time.  Speech is clear and fluent.   Cranial Nerves: Pupils equal round and reactive to light.  Facial tone is symmetric.  Facial sensation is symmetric. Shoulder shrug is symmetric. Tongue protrusion is midline.  There  is no pronator drift.  Strength: Side Biceps Triceps Deltoid Interossei Grip Wrist Ext. Wrist Flex.  R 5 5 5 5 5 5 5   L 5 5 5 5 5 5 5    Side Iliopsoas Quads Hamstring PF DF EHL  R 5 5 5 5 5 5   L 5 5 5 5 5 5    Reflexes are 1+ and symmetric at the biceps, triceps, brachioradialis, patella and achilles.   Hoffman's is absent.   Bilateral upper and lower extremity sensation is intact to light touch.    No evidence of dysmetria noted.  Gait is slightly stooped forward.     Medical Decision Making  Imaging: MRI L spine 03/13/2024 Disc levels:   L1-2: Unremarkable   L2-3: Unremarkable   L3-4: Moderate to prominent central stenosis with borderline bilateral foraminal stenosis due to disc bulge and facet arthropathy. This is mildly worsened compared to the 02/11/2015 exam.   L4-5: Moderate central stenosis and borderline bilateral foraminal stenosis due to disc bulge and facet arthropathy. Right facet joint effusion, image 30 series 114. Mildly  worsened compared to 02/11/2015.   L5-S1: Borderline left foraminal stenosis due to disc bulge and facet arthropathy along with left foraminal annular tear. Small bilateral facet joint effusions. Small synovial cyst below the left L5-S1 facet joint.   IMPRESSION: 1. Lumbar spondylosis and degenerative disc disease, causing moderate to prominent impingement at L3-4 and moderate impingement at L4-5.     Electronically Signed   By: Ryan Salvage M.D.   On: 03/13/2024 18:55  Flexion-extension x-rays from June 21, 2024 show no abnormal movement on my evaluation  I have personally reviewed the images and agree with the above interpretation.  Assessment and Plan: Ms. Roycroft is a pleasant 83 y.o. 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  She also has facet arthrosis at L4-5 and L5-S1.    She would like to finish out her physical therapy.  She has an epidural shot scheduled for this afternoon.  Will touch base with her in 2-1/2 weeks.  If she is doing well at that time, we will see her back in February or March.  If the shot and last couple of appointments of physical therapy do not help her, we will discuss consideration of an L3-5 decompression.   I spent a total of 10 minutes in this patient's care today. This time was spent reviewing pertinent records including imaging studies, obtaining and confirming history, performing a directed evaluation, formulating and discussing my recommendations, and documenting the visit within the medical record.        Thank you for involving me in the care of this patient.      Azaela Caracci K. Clois MD, [facility]

## 2024-06-27 ENCOUNTER — Other Ambulatory Visit: Payer: Self-pay | Admitting: Oncology

## 2024-06-27 ENCOUNTER — Telehealth: Payer: Self-pay | Admitting: Oncology

## 2024-06-27 NOTE — Telephone Encounter (Signed)
 MD visit (looks like she does virtual) in the next 2-3 weeks for follow up per  RN scheduling message.  I called and left vm for pt with appt details for 07/20/2024 virtual visit

## 2024-07-17 ENCOUNTER — Other Ambulatory Visit: Payer: Self-pay | Admitting: Oncology

## 2024-07-20 ENCOUNTER — Inpatient Hospital Stay: Attending: Oncology | Admitting: Oncology

## 2024-07-20 DIAGNOSIS — Z170 Estrogen receptor positive status [ER+]: Secondary | ICD-10-CM

## 2024-07-20 DIAGNOSIS — C50212 Malignant neoplasm of upper-inner quadrant of left female breast: Secondary | ICD-10-CM | POA: Diagnosis not present

## 2024-07-20 DIAGNOSIS — M810 Age-related osteoporosis without current pathological fracture: Secondary | ICD-10-CM

## 2024-07-20 NOTE — Progress Notes (Signed)
 " [facility]  [phone] [phone]  ID: Monique Hall OB: 02-02-1941  MR#: [id number]  RDW#:[id number]  Patient Care Team: Justus Leita DEL, MD as PCP - General (Internal Medicine) Avanell Katz, MD as Referring Physician (Physical Medicine and Rehabilitation) Cindie Jesusa HERO, [name] Jacobo, Evalene PARAS, MD as Consulting Physician (Oncology) Lenn Aran, MD as Consulting Physician (Radiation Oncology) Mevelyn JONETTA Bathe, OD (Optometry) Hilarie Rocher, MD as Consulting Physician (Cardiology)  I connected with Monique Hall on 07/21/2024 at 10:45 AM EST by video enabled telemedicine visit and verified that I am speaking with the correct person using two identifiers.   I discussed the limitations, risks, security and privacy concerns of performing an evaluation and management service by telemedicine and the availability of in-person appointments. I also discussed with the patient that there may be a patient responsible charge related to this service. The patient expressed understanding and agreed to proceed.   Other persons participating in the visit and their role in the encounter: Patient, MD.  Patients location: Home. Providers location: Clinic.  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  INTERVAL HISTORY: Patient agreed to video-assisted telemedicine visit for routine 47-month evaluation.  She continues to feel well and remains asymptomatic.  She is tolerating letrozole  without significant side effects.  She has no neurologic complaints.  She denies any recent fevers or illnesses.  She has a good appetite and denies weight loss.  She has no chest pain, shortness of breath, cough, or hemoptysis.  She denies any nausea, vomiting, constipation, or diarrhea.  She has no urinary complaints.  Patient offers no specific complaints  today.  REVIEW OF SYSTEMS:   Review of Systems  Constitutional: Negative.  Negative for fever, malaise/fatigue and weight loss.  Respiratory: Negative.  Negative for cough, hemoptysis and shortness of breath.   Cardiovascular: Negative.  Negative for chest pain and leg swelling.  Gastrointestinal: Negative.  Negative for abdominal pain.  Genitourinary: Negative.  Negative for dysuria.  Musculoskeletal: Negative.  Negative for back pain.  Skin: Negative.  Negative for rash.  Neurological: Negative.  Negative for dizziness, focal weakness, weakness and headaches.  Psychiatric/Behavioral: Negative.  The patient is not nervous/anxious.     As per HPI. Otherwise, a complete review of systems is negative.  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/2020   Esophagitis    Gastritis    Gastroesophageal reflux disease without esophagitis    History of kidney stones    Hypercholesteremia    Hypertension    Lipoma of neck 04/27/2018   Personal history of radiation therapy    Sciatica    Tendonitis, Achilles 2016   both    Thyroid  nodule 2019    PAST SURGICAL HISTORY: Past Surgical History:  Procedure Laterality Date   ABDOMINAL HYSTERECTOMY  1985   total for bleeding   APPENDECTOMY     BIOPSY THYROID   2019   BLADDER SUSPENSION  2004   BREAST BIOPSY Left 10/09/2020   US  Bx, Q-clip, [facility]   BREAST LUMPECTOMY Left 11/06/2020   BREAST LUMPECTOMY,RADIO FREQ LOCALIZER,AXILLARY SENTINEL LYMPH NODE BIOPSY Left 11/06/2020   INVASIVE MAMMARY CARCINOMA, NO SPECIAL TYPE BREAST LUMPECTOMY,RADIO FREQ LOCALIZER,AXILLARY SENTINEL LYMPH NODE BIOPSY;  Surgeon: Tye Millet, DO;  Location: [facility] ORS;  Service: General;  Laterality: Left;   lipoma excision  03/2018   posterior neck    FAMILY HISTORY: Family History  Problem Relation Age of Onset   CAD Mother    Diabetes Mother     Arthritis Mother    Hypertension Mother    Parkinson's disease Father    COPD Father    Hearing loss Father    Breast cancer Neg Hx     ADVANCED DIRECTIVES (Y/N):  N  HEALTH MAINTENANCE: Social History   Tobacco Use   Smoking status: Former    Current packs/day: 0.00    Average packs/day: 1 pack/day for 35.0 years (35.0 ttl pk-yrs)    Types: Cigarettes    Start date: 09/02/1967    Quit date: 09/01/2002    Years since quitting: 21.8   Smokeless tobacco: Never   Tobacco comments:    smoking cessation materials not required  Vaping Use   Vaping status: Never Used  Substance Use Topics   Alcohol use: Not Currently    Comment: rare, last use 2020   Drug use: Never     Colonoscopy:  PAP:  Bone density:  Lipid panel:  Allergies  Allergen Reactions   Hydrochlorothiazide Other (See Comments)    hypercalcemia   Baclofen      Other reaction(s): Unknown   Statins Other (See Comments)    myalgia   Etodolac Nausea Only    Current Outpatient Medications  Medication Sig Dispense Refill   acetaminophen  (TYLENOL ) 500 MG tablet Take 1 tablet (500 mg total) by mouth every 6 (six) hours as needed for mild pain or moderate pain. For shoulder pain     alendronate  (FOSAMAX ) 70 MG tablet TAKE 1 TABLET BY MOUTH ONCE A WEEK. TAKE WITH A FULL GLASS OF WATER ON AN EMPTY STOMACH. 12 tablet 1   aspirin EC 81 MG tablet Take 81 mg by mouth daily.     Black Elderberry (SAMBUCUS ELDERBERRY PO) Take 1 each by mouth daily. gummy     calcium-vitamin D (OSCAL WITH D) 500-200 MG-UNIT tablet Take 1 tablet by mouth daily.     ezetimibe  (ZETIA ) 10 MG tablet Take 1 tablet (10 mg total) by mouth daily. 90 tablet 3   letrozole  (FEMARA ) 2.5 MG tablet Take 1 tablet (2.5 mg total) by mouth daily. 90 tablet 3   loratadine (CLARITIN) 10 MG tablet Take 10 mg by mouth daily.     losartan  (COZAAR ) 100 MG tablet Take 1 tablet (100 mg total) by mouth daily. 90 tablet 3   Multiple Vitamin (MULTIVITAMIN) tablet Take 1  tablet by mouth daily.     predniSONE (STERAPRED UNI-PAK 48 TAB) 5 MG (48) TBPK tablet Take by mouth.     triamcinolone  (NASACORT ) 55 MCG/ACT AERO nasal inhaler Place into the nose. (Patient taking differently: Place into the nose. prn)     No current facility-administered medications for this visit.    OBJECTIVE: There were no vitals filed for this visit.    There is no height or weight on file to calculate BMI.    ECOG FS:0 - Asymptomatic  General: Well-developed, well-nourished, no acute distress. HEENT: Normocephalic. Neuro: Alert, answering all questions appropriately. Cranial nerves grossly intact. Psych: Normal affect.   LAB RESULTS:  Lab Results  Component Value Date   NA 140 10/11/2023   K 4.5 10/11/2023   CL 102 10/11/2023   CO2 24 10/11/2023   GLUCOSE 96 10/11/2023   BUN 14 10/11/2023   CREATININE 0.82 10/11/2023   CALCIUM 10.0 10/11/2023  PROT 7.1 10/11/2023   ALBUMIN 4.4 10/11/2023   AST 27 10/11/2023   ALT 21 10/11/2023   ALKPHOS 55 10/11/2023   BILITOT 0.9 10/11/2023   GFRNONAA 55 (L) 09/20/2019   GFRAA 63 09/20/2019    Lab Results  Component Value Date   WBC 6.9 10/11/2023   NEUTROABS 3.1 10/11/2023   HGB 12.8 10/11/2023   HCT 39.7 10/11/2023   MCV 93 10/11/2023   PLT 388 10/11/2023     STUDIES: DG Lumbar Spine Complete Result Date: 06/27/2024 CLINICAL DATA:  Back pain and radiculopathy EXAM: DG LUMBAR SPINE COMPLETE 4+V COMPARISON:  03/13/2024 FINDINGS: Frontal, lateral neutral, lateral flexion, lateral extension views of the lumbar spine are obtained. There are 5 non-rib-bearing lumbar type vertebral bodies, with mild left convex curvature centered at L3-4. Otherwise alignment is anatomic. There are no acute fractures. Multilevel spondylosis and facet hypertrophy, greatest at L3-4 and L4-5. There is limited excursion of the lumbar spine during flexion and extension, but no instability is identified. Sacroiliac joints are unremarkable.  IMPRESSION: 1. Stable lumbar spondylosis and facet hypertrophy. 2. No acute fracture. 3. No instability with flexion or extension. Electronically Signed   By: Ozell Daring M.D.   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  She completed adjuvant XRT on December 26, 2020.  Continue letrozole  for a total of 5 years completing treatment in June 2027.  Patient's most recent mammogram on October 20, 2023 was reported as BI-RADS 1.  Repeat in April 2026.  Return to clinic in 6 months with video-assisted telemedicine visit.   Osteoporosis: Patient's most recent bone mineral density on January 17, 2023 reported T-score of -2.5.  Continue Fosamax , calcium, and vitamin D supplementation.  Repeat in April 2026 along with mammogram as above.  I provided 20 minutes of face-to-face video visit time during this encounter which included chart review, counseling, and coordination of care as documented above.   Patient expressed understanding and was in agreement with this plan. She also understands that She can call clinic at any time with any questions, concerns, or complaints.    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

## 2024-07-23 ENCOUNTER — Telehealth: Payer: Self-pay | Admitting: Family Medicine

## 2024-07-23 NOTE — Telephone Encounter (Deleted)
-----   Message from Monique Daisy, MD sent at 07/23/2024  9:53 AM EST ----- Monique Hall worth getting her back on the schedule to discuss symtpoms ----- Message ----- From: Sebastian Elenor HERO, CMA Sent: 07/09/2024   9:34 AM EST To: Monique Daisy, MD  She is still in pain when she is up and moving around. She states it doesn't hurt if she is just sitting. The shot helped a little bit but not very much. Her last session of PT will be on December 24. ----- Message ----- From: Hall Reeves, MD Sent: 07/09/2024   9:00 AM EST To: [facility]  Can someone please touch base with her to see how she is doing after her shot and last 3 visits of PT?  Thanks

## 2024-07-23 NOTE — Telephone Encounter (Signed)
 Spoke to patient and appointment has been scheduled.   Message Received: Today Clois Fret, MD  Sebastian Elenor HERO, CMA [place] worth getting her back on the schedule to discuss symtpoms      ----- Message ----- From: Sebastian Elenor HERO, CMA Sent: 07/09/2024   9:34 AM EST To: Fret Clois, MD  She is still in pain when she is up and moving around. She states it doesn't hurt if she is just sitting. The shot helped a little bit but not very much. Her last session of PT will be on December 24. ----- Message ----- From: Clois Fret, MD Sent: 07/09/2024   9:00 AM EST To: [facility]  Can someone please touch base with her to see how she is doing after her shot and last 3 visits of PT?  Thanks

## 2024-08-08 ENCOUNTER — Ambulatory Visit: Admitting: Student

## 2024-08-08 ENCOUNTER — Ambulatory Visit: Payer: Self-pay

## 2024-08-08 VITALS — BP 150/76 | HR 93 | Temp 97.9°F | Ht 64.5 in | Wt 178.4 lb

## 2024-08-08 DIAGNOSIS — I10 Essential (primary) hypertension: Secondary | ICD-10-CM

## 2024-08-08 MED ORDER — AMLODIPINE BESYLATE 5 MG PO TABS: 5.0000 mg | ORAL_TABLET | Freq: Every day | ORAL | 1 refills | Status: AC

## 2024-08-08 NOTE — Assessment & Plan Note (Addendum)
 Currently on losartan  100 mg daily in the morning.  Has not missed medication.  Asymptomatic. BP 150/76. She did bring home cuff and seems to be reading higher than clinic readings. Gave recommendations for validated devices.  -Start amlodipine  5 mg daily.  -low sodium diet -Keep home log, bring log to next appointment

## 2024-08-08 NOTE — Telephone Encounter (Signed)
 FYI Only or Action Required?: FYI only for provider: appointment scheduled on 08/08/2024.  Patient was last seen in primary care on 04/12/2024 by Justus Leita DEL, MD.  Called Nurse Triage reporting Hypertension.  Symptoms began several days ago.  Interventions attempted: Prescription medications: losartan .  Symptoms are: unchanged.  Triage Disposition: See Physician Within 24 Hours  Patient/caregiver understands and will follow disposition?: Yes  Message from [facility] sent at 08/08/2024 10:00 AM EST  Reason for Triage: BP this morning 179/100..  just now 190/92 feeling jittery and nervous.. nauseated yesterday ( but not today )  unsure if alendronate  (FOSAMAX ) 70 MG tablet is having an effect - took it yesterday injection 12/4 the medication was increased   Reason for Disposition  Systolic BP >= 180 OR Diastolic >= 110  [8] Systolic BP >= 200 OR Diastolic >= 120 AND [2] having NO cardiac or neurologic symptoms  Answer Assessment - Initial Assessment Questions Contacted CAL, Angela, no appt available for BP check Denies all cardiac symptoms  1. BLOOD PRESSURE: What is your blood pressure? Did you take at least two measurements 5 minutes apart?     BP today 179/100  190/92 (before taking medication) BP taken 15 min before phone call Blood pressure at 1013 204/100  08/06/24 156/79, 08/07/2024 174/82 - unsure if BP medication was taken before BP reading  2. ONSET: When did you take your blood pressure?     Two days ago 3. HOW: How did you take your blood pressure? (e.g., automatic home BP monitor, visiting nurse)     Automatic home cuff 4. HISTORY: Do you have a history of high blood pressure?     Yes, history of hypertension, does not normally monitor BP, but it was elevated recently at an 174/82 5. MEDICINES: Are you taking any medicines for blood pressure? Have you missed any doses recently?     No missed dosage 6. OTHER SYMPTOMS: Do you have any symptoms?  (e.g., blurred vision, chest pain, difficulty breathing, headache, weakness)     Light headed and nervous/jittery feeling  Protocols used: Blood Pressure - High-A-AH

## 2024-08-08 NOTE — Telephone Encounter (Signed)
Noted  Pt has an appt  KP 

## 2024-08-08 NOTE — Progress Notes (Signed)
 "  Established Patient Office Visit  Subjective   Patient ID: Monique Hall, female    DOB: [birth year]  Age: 84 y.o. MRN: [id number]  Chief Complaint  Patient presents with   Hypertension    Monique Hall is a 84 y.o. person with medical hx listed below who presents today for HTN follow up. On she had 1/13 [employer] RN visit an noted BP was around 150/80. Has been checking with home machine. Started checking BP at home on 1/19 reports notes BP has been elevated to  204/100 this morning. BP in the last few days  around 170/90. Some nausea after taking alendronate  yesterday, has been on this for about a year. Otherwise feeling well denies CP dyspnea, abdominal pain, visual changes, weakness, or sensory change.  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) 10/23/2020   Shoulder pain, bilateral 01/24/2020   Aortic atherosclerosis 09/15/2018   Centrilobular emphysema (HCC) 09/15/2018   Thyroid  nodule 10/06/2017   Gastroesophageal reflux disease without esophagitis 09/02/2016   Essential hypertension 07/29/2015   Mixed hyperlipidemia 07/29/2015   Menopausal symptoms 07/29/2015   Tobacco use disorder, moderate, in sustained remission 07/29/2015   Degeneration of intervertebral disc of lumbar region 06/17/2015   Neuritis 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 02/05/2015      ROS Refer to HPI    Objective:     Outpatient Encounter Medications as of 08/08/2024  Medication Sig   acetaminophen  (TYLENOL ) 500 MG tablet Take 1 tablet (500 mg total) by mouth every 6 (six) hours as needed for mild pain or moderate pain. For shoulder pain   alendronate  (FOSAMAX ) 70 MG tablet TAKE 1 TABLET BY MOUTH ONCE A WEEK. TAKE WITH A FULL GLASS OF WATER ON AN EMPTY STOMACH.   amLODipine  (NORVASC ) 5  MG tablet Take 1 tablet (5 mg total) by mouth daily.   aspirin EC 81 MG tablet Take 81 mg by mouth daily.   Black Elderberry (SAMBUCUS ELDERBERRY PO) Take 1 each by mouth daily. gummy   calcium-vitamin D (OSCAL WITH D) 500-200 MG-UNIT tablet Take 1 tablet by mouth daily.   ezetimibe  (ZETIA ) 10 MG tablet Take 1 tablet (10 mg total) by mouth daily.   letrozole  (FEMARA ) 2.5 MG tablet Take 1 tablet (2.5 mg total) by mouth daily.   loratadine (CLARITIN) 10 MG tablet Take 10 mg by mouth daily.   losartan  (COZAAR ) 100 MG tablet Take 1 tablet (100 mg total) by mouth daily.   Multiple Vitamin (MULTIVITAMIN) tablet Take 1 tablet by mouth daily.   triamcinolone  (NASACORT ) 55 MCG/ACT AERO nasal inhaler Place into the nose. (Patient taking differently: Place into the nose. prn)   predniSONE (STERAPRED UNI-PAK 48 TAB) 5 MG (48) TBPK tablet Take by mouth. (Patient not taking: Reported on 08/08/2024)   No facility-administered encounter medications on file as of 08/08/2024.    BP (!) 150/76   Pulse 93   Temp 97.9 F (36.6 C) (Oral)   Ht 5' 4.5 (1.638 m)   Wt 178 lb 6 oz (80.9 kg)   SpO2 99%   BMI 30.15 kg/m  BP Readings from Last 3 Encounters:  08/08/24 (!) 150/76  06/21/24 (!) 142/82  04/26/24 136/80    Physical Exam     08/08/2024    1:23 PM 04/12/2024    9:18  AM 01/12/2024    2:36 PM  Depression screen PHQ 2/9  Decreased Interest 0 0 0  Down, Depressed, Hopeless 0 0 0  PHQ - 2 Score 0 0 0  Altered sleeping  0 1  Tired, decreased energy  0 0  Change in appetite  0 0  Feeling bad or failure about yourself   0 0  Trouble concentrating  0 0  Moving slowly or fidgety/restless  0 0  Suicidal thoughts  0 0  PHQ-9 Score  0  1   Difficult doing work/chores  Not difficult at all Not difficult at all     Data saved with a previous flowsheet row definition       08/08/2024    1:23 PM 04/12/2024    9:18 AM 10/11/2023    8:19 AM 04/12/2023   10:39 AM  GAD 7 : Generalized Anxiety Score   Nervous, Anxious, on Edge 0 0  0  0   Control/stop worrying 0 0  0  0   Worry too much - different things  0  0  0   Trouble relaxing  0  0  0   Restless  0  0  0   Easily annoyed or irritable  0  0  0   Afraid - awful might happen  0  0  0   Total GAD 7 Score  0 0 0  Anxiety Difficulty  Not difficult at all Not difficult at all Not difficult at all     Data saved with a previous flowsheet row definition    No results found for any visits on 08/08/24.  Last CBC Lab Results  Component Value Date   WBC 6.9 10/11/2023   HGB 12.8 10/11/2023   HCT 39.7 10/11/2023   MCV 93 10/11/2023   MCH 29.9 10/11/2023   RDW 13.7 10/11/2023   PLT 388 10/11/2023   Last metabolic panel Lab Results  Component Value Date   GLUCOSE 96 10/11/2023   NA 140 10/11/2023   K 4.5 10/11/2023   CL 102 10/11/2023   CO2 24 10/11/2023   BUN 14 10/11/2023   CREATININE 0.82 10/11/2023   EGFR 71 10/11/2023   CALCIUM 10.0 10/11/2023   PROT 7.1 10/11/2023   ALBUMIN 4.4 10/11/2023   LABGLOB 2.7 10/11/2023   AGRATIO 1.7 10/07/2022   BILITOT 0.9 10/11/2023   ALKPHOS 55 10/11/2023   AST 27 10/11/2023   ALT 21 10/11/2023   Last lipids Lab Results  Component Value Date   CHOL 229 (H) 10/11/2023   HDL 58 10/11/2023   LDLCALC 134 (H) 10/11/2023   TRIG 210 (H) 10/11/2023   CHOLHDL 3.9 10/11/2023      The ASCVD Risk score (Arnett DK, et al., 2019) failed to calculate for the following reasons:   The 2019 ASCVD risk score is only valid for ages 48 to 22   * - Cholesterol units were assumed    Assessment & Plan:  Essential hypertension Assessment & Plan: Currently on losartan  100 mg daily in the morning.  Has not missed medication.  Asymptomatic. BP 150/76. She did bring home cuff and seems to be reading higher than clinic readings. Gave recommendations for validated devices.  -Start amlodipine  5 mg daily.  -low sodium diet -Keep home log, bring log to next appointment   Other orders -      amLODIPine  Besylate; Take 1 tablet (5 mg total) by mouth daily.  Dispense: 30 tablet; Refill: 1  Monique Saddler, MD "

## 2024-08-08 NOTE — Patient Instructions (Signed)
 [email or web address]

## 2024-08-14 ENCOUNTER — Ambulatory Visit: Admitting: Neurosurgery

## 2024-10-12 ENCOUNTER — Encounter: Admitting: Student

## 2024-10-24 ENCOUNTER — Other Ambulatory Visit

## 2024-10-24 ENCOUNTER — Encounter

## 2024-11-06 ENCOUNTER — Encounter: Admitting: Student

## 2025-01-24 ENCOUNTER — Ambulatory Visit

## 2025-01-28 ENCOUNTER — Inpatient Hospital Stay: Admitting: Oncology
